# Patient Record
Sex: Female | Born: 1970 | Hispanic: No | Marital: Married | Smoking: Never smoker
Health system: Southern US, Community
[De-identification: ages and names within clinical notes are randomized; demographics above are authoritative.]

## PROBLEM LIST (undated history)

## (undated) DIAGNOSIS — E785 Hyperlipidemia, unspecified: Secondary | ICD-10-CM

## (undated) DIAGNOSIS — E119 Type 2 diabetes mellitus without complications: Secondary | ICD-10-CM

## (undated) HISTORY — DX: Type 2 diabetes mellitus without complications: E11.9

## (undated) HISTORY — DX: Hyperlipidemia, unspecified: E78.5

---

## 2001-03-29 ENCOUNTER — Emergency Department (HOSPITAL_COMMUNITY): Admission: EM | Admit: 2001-03-29 | Discharge: 2001-03-29 | Payer: Self-pay | Admitting: Emergency Medicine

## 2004-03-28 ENCOUNTER — Emergency Department (HOSPITAL_COMMUNITY): Admission: EM | Admit: 2004-03-28 | Discharge: 2004-03-29 | Payer: Self-pay | Admitting: Emergency Medicine

## 2004-04-12 ENCOUNTER — Emergency Department (HOSPITAL_COMMUNITY): Admission: EM | Admit: 2004-04-12 | Discharge: 2004-04-12 | Payer: Self-pay | Admitting: Emergency Medicine

## 2004-04-16 ENCOUNTER — Emergency Department (HOSPITAL_COMMUNITY): Admission: EM | Admit: 2004-04-16 | Discharge: 2004-04-16 | Payer: Self-pay | Admitting: *Deleted

## 2005-04-12 ENCOUNTER — Ambulatory Visit (HOSPITAL_COMMUNITY): Admission: RE | Admit: 2005-04-12 | Discharge: 2005-04-12 | Payer: Self-pay | Admitting: Obstetrics & Gynecology

## 2005-07-21 ENCOUNTER — Ambulatory Visit: Payer: Self-pay | Admitting: Family Medicine

## 2005-07-21 ENCOUNTER — Inpatient Hospital Stay (HOSPITAL_COMMUNITY): Admission: AD | Admit: 2005-07-21 | Discharge: 2005-07-23 | Payer: Self-pay | Admitting: *Deleted

## 2005-07-21 ENCOUNTER — Encounter (INDEPENDENT_AMBULATORY_CARE_PROVIDER_SITE_OTHER): Payer: Self-pay | Admitting: *Deleted

## 2007-09-01 ENCOUNTER — Emergency Department (HOSPITAL_COMMUNITY): Admission: EM | Admit: 2007-09-01 | Discharge: 2007-09-01 | Payer: Self-pay | Admitting: Emergency Medicine

## 2007-09-08 ENCOUNTER — Emergency Department (HOSPITAL_COMMUNITY): Admission: EM | Admit: 2007-09-08 | Discharge: 2007-09-09 | Payer: Self-pay | Admitting: Emergency Medicine

## 2010-03-07 ENCOUNTER — Emergency Department (HOSPITAL_COMMUNITY): Admission: EM | Admit: 2010-03-07 | Discharge: 2010-03-07 | Payer: Self-pay | Admitting: Emergency Medicine

## 2010-10-08 ENCOUNTER — Emergency Department (HOSPITAL_COMMUNITY)
Admission: EM | Admit: 2010-10-08 | Discharge: 2010-10-09 | Disposition: A | Discharge status: Home / Self Care | Payer: Self-pay | Attending: Emergency Medicine | Admitting: Emergency Medicine

## 2010-10-08 ENCOUNTER — Emergency Department (HOSPITAL_COMMUNITY): Payer: Self-pay

## 2010-10-08 DIAGNOSIS — R112 Nausea with vomiting, unspecified: Secondary | ICD-10-CM | POA: Insufficient documentation

## 2010-10-08 DIAGNOSIS — R51 Headache: Secondary | ICD-10-CM | POA: Insufficient documentation

## 2010-10-08 DIAGNOSIS — H53149 Visual discomfort, unspecified: Secondary | ICD-10-CM | POA: Insufficient documentation

## 2010-10-08 LAB — URINALYSIS, ROUTINE W REFLEX MICROSCOPIC
Bilirubin Urine: NEGATIVE
Glucose, UA: NEGATIVE mg/dL
Ketones, ur: NEGATIVE mg/dL
Urobilinogen, UA: 1 mg/dL (ref 0.0–1.0)
pH: 7 (ref 5.0–8.0)

## 2010-10-08 LAB — POCT I-STAT, CHEM 8
BUN: 11 mg/dL (ref 6–23)
Calcium, Ion: 1.12 mmol/L (ref 1.12–1.32)
Chloride: 105 mEq/L (ref 96–112)
Glucose, Bld: 95 mg/dL (ref 70–99)
Hemoglobin: 12.2 g/dL (ref 12.0–15.0)
Sodium: 141 mEq/L (ref 135–145)

## 2010-10-08 LAB — POCT PREGNANCY, URINE: Preg Test, Ur: NEGATIVE

## 2010-10-12 LAB — URINE MICROSCOPIC-ADD ON

## 2010-10-12 LAB — URINALYSIS, ROUTINE W REFLEX MICROSCOPIC
Bilirubin Urine: NEGATIVE
Protein, ur: NEGATIVE mg/dL
Urobilinogen, UA: 0.2 mg/dL (ref 0.0–1.0)
pH: 5.5 (ref 5.0–8.0)

## 2010-10-12 LAB — COMPREHENSIVE METABOLIC PANEL
ALT: 44 U/L — ABNORMAL HIGH (ref 0–35)
Alkaline Phosphatase: 65 U/L (ref 39–117)
CO2: 22 mEq/L (ref 19–32)
Calcium: 8.8 mg/dL (ref 8.4–10.5)
Creatinine, Ser: 0.68 mg/dL (ref 0.4–1.2)
Sodium: 139 mEq/L (ref 135–145)
Total Bilirubin: 0.7 mg/dL (ref 0.3–1.2)

## 2010-10-12 LAB — WET PREP, GENITAL: Trich, Wet Prep: NONE SEEN

## 2010-10-12 LAB — DIFFERENTIAL
Basophils Relative: 0 % (ref 0–1)
Eosinophils Absolute: 0.1 10*3/uL (ref 0.0–0.7)
Eosinophils Relative: 2 % (ref 0–5)
Lymphocytes Relative: 39 % (ref 12–46)
Lymphs Abs: 1.7 10*3/uL (ref 0.7–4.0)
Monocytes Absolute: 0.3 10*3/uL (ref 0.1–1.0)
Monocytes Relative: 7 % (ref 3–12)
Neutro Abs: 2.3 10*3/uL (ref 1.7–7.7)

## 2010-10-12 LAB — CBC
MCH: 31 pg (ref 26.0–34.0)
MCV: 88.7 fL (ref 78.0–100.0)
Platelets: 208 10*3/uL (ref 150–400)
RBC: 4.26 MIL/uL (ref 3.87–5.11)
WBC: 4.3 10*3/uL (ref 4.0–10.5)

## 2010-10-12 LAB — LIPASE, BLOOD: Lipase: 24 U/L (ref 11–59)

## 2010-10-12 LAB — HEMOCCULT GUIAC POC 1CARD (OFFICE): Fecal Occult Bld: NEGATIVE

## 2011-04-19 LAB — DIFFERENTIAL
Eosinophils Relative: 1
Lymphocytes Relative: 8 — ABNORMAL LOW
Lymphs Abs: 0.9
Neutrophils Relative %: 87 — ABNORMAL HIGH

## 2011-04-19 LAB — POCT I-STAT CREATININE
Creatinine, Ser: 0.5
Operator id: 277751

## 2011-04-19 LAB — I-STAT 8, (EC8 V) (CONVERTED LAB)
Acid-Base Excess: 1
Hemoglobin: 12.2
Potassium: 3.4 — ABNORMAL LOW
Sodium: 137
TCO2: 24

## 2011-04-19 LAB — CBC
HCT: 34.7 — ABNORMAL LOW
Platelets: 374
RBC: 3.87
WBC: 10.6 — ABNORMAL HIGH

## 2015-08-04 ENCOUNTER — Telehealth: Payer: Self-pay | Admitting: *Deleted

## 2015-08-04 DIAGNOSIS — Z20818 Contact with and (suspected) exposure to other bacterial communicable diseases: Secondary | ICD-10-CM

## 2015-08-04 MED ORDER — AZITHROMYCIN 250 MG PO TABS: ORAL_TABLET | ORAL | Status: AC

## 2015-08-04 NOTE — Telephone Encounter (Signed)
Patient's son has pertussis. I am prescribing a course of prophylactic azithromycin to patient.  

## 2016-01-31 ENCOUNTER — Encounter (HOSPITAL_COMMUNITY): Payer: Self-pay | Admitting: Emergency Medicine

## 2016-01-31 ENCOUNTER — Ambulatory Visit (INDEPENDENT_AMBULATORY_CARE_PROVIDER_SITE_OTHER): Payer: Self-pay

## 2016-01-31 ENCOUNTER — Ambulatory Visit (HOSPITAL_COMMUNITY)
Admission: EM | Admit: 2016-01-31 | Discharge: 2016-01-31 | Disposition: A | Discharge status: Home / Self Care | Payer: Self-pay | Attending: Emergency Medicine | Admitting: Emergency Medicine

## 2016-01-31 DIAGNOSIS — S46911A Strain of unspecified muscle, fascia and tendon at shoulder and upper arm level, right arm, initial encounter: Secondary | ICD-10-CM

## 2016-01-31 DIAGNOSIS — G44209 Tension-type headache, unspecified, not intractable: Secondary | ICD-10-CM

## 2016-01-31 MED ORDER — DICLOFENAC SODIUM 75 MG PO TBEC: 75.0000 mg | DELAYED_RELEASE_TABLET | Freq: Two times a day (BID) | ORAL | Status: DC

## 2016-01-31 NOTE — ED Notes (Signed)
[facility] a [facility] of right arm numbness and tingling that occurred yesterday and a headache with blurred vision that occurred today.

## 2016-01-31 NOTE — Discharge Instructions (Signed)
Dolor en el hombro (Shoulder Pain)  El hombro es la articulacin que une los brazos al cuerpo. Los msculos y tejidos similares a cuerdas [employer] Timken Companyunen los huesos a los msculos (tendones). El dolor en el hombro aparece como consecuencia de una lesin o una enfermedad que afecta una o ms partes del hombro. CUIDADOS EN EL HOGAR   Aplique hielo sobre la zona dolorida.  Ponga el hielo en una bolsa plstica.  Colquese una toalla entre la piel y la bolsa de hielo.  Deje el hielo en el lugar durante 15 a 20 minutos, 3 a 4 veces por Allstateda durante los [address] Nwprimeros das.  Deje de usar las compresas fras si no Editor, commissioningle calman el dolor.  Si le indican el uso de algn dispositivo para impedir que el hombro se mueva (cabestillo, inmovilizador del hombro), selo segn las indicaciones. Slo debe quitarlo para ducharse o baarse.  Mueva el brazo lo menos posible, pero mantenga la mano en movimiento para evitar la inflamacin (hinchazn).  Apriete una pelota blanda o una almohadilla de espuma de goma siempre que pueda, para evitar la hinchazn.  Tome los medicamentos como le indic el mdico. SOLICITE AYUDA SI:  Siente un dolor nuevo que se hace cada vez ms intenso en el brazo, la mano o los dedos.  La mano o los dedos estn fros.  Los medicamentos no Editor, commissioningle calman el dolor. SOLICITE AYUDA DE INMEDIATO SI:   El brazo, la mano o los dedos estn adormecidos o siente hormigueos.  El brazo, la mano o los dedos estn hinchados (inflamados), le duelen o se ven blancos o azules. ASEGRESE DE QUE:   Comprende estas instrucciones.  Controlar su enfermedad.  Solicitar ayuda de inmediato si no mejora o si empeora.   Esta informacin no tiene Theme park managercomo fin reemplazar el consejo del mdico. Asegrese de hacerle al mdico cualquier pregunta que tenga.   Document Released: 10/11/2008 Document Revised: 08/05/2014 Elsevier Interactive Patient Education [employer]2016 Elsevier Inc.

## 2016-01-31 NOTE — ED Provider Notes (Signed)
CSN: [id number]     Arrival date & time 01/31/16  1700 History   First MD Initiated Contact with Patient 01/31/16 1750     Chief Complaint  Patient presents with  . Headache  . Numbness   (Consider location/radiation/quality/duration/timing/severity/associated sxs/prior Treatment) HPI History obtained from patient: Location:  Right shoulder, head Context/Duration: Symptoms started yesterday last night. Patient states that her arm to felt funny and she developed a headache arm did feel better after a while. Now has shoulder pain  Severity: 3   Quality: Soreness Timing:         Constant   Home Treatment: Tylenol Associated symptoms:  Headache no treatment at home. No vision changes. No thunderclap sudden onset headache. No nausea vomiting. Family History: Hypertension   History reviewed. No pertinent past medical history. History reviewed. No pertinent past surgical history. History reviewed. No pertinent family history. Social History  Substance Use Topics  . Smoking status: Never Smoker   . Smokeless tobacco: None  . Alcohol Use: No   OB History    No data available     Review of Systems  Denies:  , NAUSEA, ABDOMINAL PAIN, CHEST PAIN, CONGESTION, DYSURIA, SHORTNESS OF BREATH  Allergies  Review of patient's allergies indicates no known allergies.  Home Medications   Prior to Admission medications   Not on File   Meds Ordered and Administered this Visit  Medications - No data to display  BP 130/84 mmHg  Pulse 65  Temp(Src) 98.6 F (37 C) (Oral)  Resp 18  SpO2 100%  LMP 01/01/2016 (Approximate) No data found.   Physical Exam NURSES NOTES AND VITAL SIGNS REVIEWED. CONSTITUTIONAL: Well developed, well nourished, no acute distress HEENT: normocephalic, atraumatic EYES: Conjunctiva normal NECK:normal ROM, supple, no adenopathy PULMONARY:No respiratory distress, normal effort ABDOMINAL: Soft, ND, NT BS+, No CVAT MUSCULOSKELETAL: Normal ROM of all extremities,  right shoulder tender anteriorly with good range of motion neurovascular integrity is intact grip strength is a little decreased on the right as compared to the left. SKIN: warm and dry without rash PSYCHIATRIC: Mood and affect, behavior are normal  ED Course  Procedures (including critical care time)  Labs Review Labs Reviewed - No data to display  Imaging Review Dg Shoulder Right  01/31/2016  CLINICAL DATA:  Per pt AND pt's son: pain, numbness in the right arm, tingling in the right hand and fingers, severe headache, right eye was swollen this morning. No injury. Non-smoker. Patient is not a diabetic EXAM: RIGHT SHOULDER - 2+ VIEW COMPARISON:  None. FINDINGS: Glenohumeral joint is intact. No evidence of scapular fracture or humeral fracture. The acromioclavicular joint is intact. IMPRESSION: No fracture or dislocation. Electronically Signed   By: Genevive BiStewart  Edmunds M.D.   On: 01/31/2016 18:51    Reviewed both xray Independently and radiologist report as part of medical decision making Visual Acuity Review  Right Eye Distance:   Left Eye Distance:   Bilateral Distance:    Right Eye Near:   Left Eye Near:    Bilateral Near:      RX diclofenac Return to work note   MDM   1. Tension-type headache, not intractable, unspecified chronicity pattern   2. Repetitive strain injury of right shoulder, initial encounter     Patient is reassured that there are no issues that require transfer to higher level of care at this time or additional tests. Patient is advised to continue home symptomatic treatment. Patient is advised that if there are new or worsening symptoms  to attend the emergency department, contact primary care provider, or return to UC. Instructions of care provided discharged home in stable condition.    THIS NOTE WAS GENERATED USING A VOICE RECOGNITION SOFTWARE PROGRAM. ALL REASONABLE EFFORTS  WERE MADE TO PROOFREAD THIS DOCUMENT FOR ACCURACY.  I have verbally reviewed  the discharge instructions with the patient. A printed AVS was given to the patient.  All questions were answered prior to discharge.      Tharon AquasFrank C Lajuana Patchell, PA 01/31/16 [phone]

## 2016-11-26 ENCOUNTER — Emergency Department (HOSPITAL_COMMUNITY)
Admission: EM | Admit: 2016-11-26 | Discharge: 2016-11-27 | Disposition: A | Discharge status: Home / Self Care | Payer: Self-pay | Attending: Emergency Medicine | Admitting: Emergency Medicine

## 2016-11-26 ENCOUNTER — Emergency Department (HOSPITAL_COMMUNITY): Payer: Self-pay

## 2016-11-26 ENCOUNTER — Encounter (HOSPITAL_COMMUNITY): Payer: Self-pay | Admitting: Emergency Medicine

## 2016-11-26 DIAGNOSIS — K5790 Diverticulosis of intestine, part unspecified, without perforation or abscess without bleeding: Secondary | ICD-10-CM | POA: Insufficient documentation

## 2016-11-26 DIAGNOSIS — R1032 Left lower quadrant pain: Secondary | ICD-10-CM

## 2016-11-26 LAB — URINALYSIS, ROUTINE W REFLEX MICROSCOPIC
BILIRUBIN URINE: NEGATIVE
GLUCOSE, UA: 50 mg/dL — AB
HGB URINE DIPSTICK: NEGATIVE
Ketones, ur: NEGATIVE mg/dL
Leukocytes, UA: NEGATIVE
NITRITE: NEGATIVE
PH: 5 (ref 5.0–8.0)
Protein, ur: NEGATIVE mg/dL
SPECIFIC GRAVITY, URINE: 1.032 — AB (ref 1.005–1.030)

## 2016-11-26 LAB — CBC
HEMATOCRIT: 35.6 % — AB (ref 36.0–46.0)
HEMOGLOBIN: 12.2 g/dL (ref 12.0–15.0)
MCH: 30.7 pg (ref 26.0–34.0)
MCHC: 34.3 g/dL (ref 30.0–36.0)
MCV: 89.4 fL (ref 78.0–100.0)
Platelets: 265 10*3/uL (ref 150–400)
RBC: 3.98 MIL/uL (ref 3.87–5.11)
RDW: 12.8 % (ref 11.5–15.5)
WBC: 8.6 10*3/uL (ref 4.0–10.5)

## 2016-11-26 LAB — COMPREHENSIVE METABOLIC PANEL
ALK PHOS: 79 U/L (ref 38–126)
ALT: 73 U/L — AB (ref 14–54)
AST: 44 U/L — AB (ref 15–41)
Albumin: 3.8 g/dL (ref 3.5–5.0)
Anion gap: 5 (ref 5–15)
BILIRUBIN TOTAL: 0.6 mg/dL (ref 0.3–1.2)
BUN: 9 mg/dL (ref 6–20)
CHLORIDE: 107 mmol/L (ref 101–111)
CO2: 25 mmol/L (ref 22–32)
Calcium: 8.9 mg/dL (ref 8.9–10.3)
Creatinine, Ser: 0.62 mg/dL (ref 0.44–1.00)
GFR calc Af Amer: >60 mL/min (ref >60)
Glucose, Bld: 120 mg/dL — ABNORMAL HIGH (ref 65–99)
POTASSIUM: 3.7 mmol/L (ref 3.5–5.1)
Sodium: 137 mmol/L (ref 135–145)
Total Protein: 6.7 g/dL (ref 6.5–8.1)

## 2016-11-26 LAB — LIPASE, BLOOD: LIPASE: 21 U/L (ref 11–51)

## 2016-11-26 LAB — I-STAT BETA HCG BLOOD, ED (MC, WL, AP ONLY): I-stat hCG, quantitative: <5 m[IU]/mL (ref <5)

## 2016-11-26 MED ORDER — ONDANSETRON HCL 4 MG/2ML IJ SOLN
4.0000 mg | Freq: Once | INTRAMUSCULAR | Status: AC
  Administered 2016-11-26: 4 mg via INTRAVENOUS
  Filled 2016-11-26: qty 2

## 2016-11-26 MED ORDER — ONDANSETRON 4 MG PO TBDP: 4.0000 mg | ORAL_TABLET | Freq: Once | ORAL | Status: DC | PRN

## 2016-11-26 MED ORDER — OXYCODONE-ACETAMINOPHEN 5-325 MG PO TABS
ORAL_TABLET | ORAL | Status: AC
  Filled 2016-11-26: qty 1

## 2016-11-26 MED ORDER — IOPAMIDOL (ISOVUE-300) INJECTION 61%
INTRAVENOUS | Status: AC
  Administered 2016-11-26: 100 mL
  Filled 2016-11-26: qty 100

## 2016-11-26 MED ORDER — MORPHINE SULFATE (PF) 4 MG/ML IV SOLN
4.0000 mg | Freq: Once | INTRAVENOUS | Status: AC
  Administered 2016-11-26: 4 mg via INTRAVENOUS
  Filled 2016-11-26: qty 1

## 2016-11-26 MED ORDER — ONDANSETRON 4 MG PO TBDP
ORAL_TABLET | ORAL | Status: AC
  Administered 2016-11-26: 4 mg
  Filled 2016-11-26: qty 1

## 2016-11-26 MED ORDER — OXYCODONE-ACETAMINOPHEN 5-325 MG PO TABS
1.0000 | ORAL_TABLET | Freq: Once | ORAL | Status: AC
  Administered 2016-11-26: 1 via ORAL

## 2016-11-26 NOTE — ED Triage Notes (Signed)
Pt presents to ED for assessment of LLQ pain starting two days and worsening.  Patient c/o nausea, with added vomiting and diarrhea today.  Paient states in the past year her periods have become irregular.  Denies any changes in urination.

## 2016-11-26 NOTE — ED Provider Notes (Signed)
[facility]-EMERGENCY DEPT Provider Note   CSN: [id number] Arrival date & time: 11/26/16  1925     History   Chief Complaint Chief Complaint  Patient presents with  . Abdominal Pain    HPI Christie Martinez is a 46 y.o. female.  HPI  46 y.o. female, presents to the Emergency Department today complaining of LLQ pain that started x 2 days ago. Notes worsening symptoms today. Notes N/V. No diarrhea. States pain is intermittent and feels like a cramping/aching sensation. Notes pain is 8/10. No fevers. No hx diverticulitis. Pt states that she has been having normal BMs. No melena. No hematochezia. No dysuria. No vaginal bleeding/discharge. No meds PTA. No other symptoms noted.   History reviewed. No pertinent past medical history.  There are no active problems to display for this patient.   History reviewed. No pertinent surgical history.  OB History    No data available       Home Medications    Prior to Admission medications   Medication Sig Start Date End Date Taking? Authorizing Provider  diclofenac (VOLTAREN) 75 MG EC tablet Take 1 tablet (75 mg total) by mouth 2 (two) times daily. 01/31/16   Tharon Aquas, PA    Family History History reviewed. No pertinent family history.  Social History Social History  Substance Use Topics  . Smoking status: Never Smoker  . Smokeless tobacco: Never Used  . Alcohol use No     Allergies   Patient has no known allergies.   Review of Systems Review of Systems ROS reviewed and all are negative for acute change except as noted in the HPI.  Physical Exam Updated Vital Signs BP 127/81   Pulse 65   Temp 98.6 F (37 C) (Oral)   Resp 17   LMP 11/12/2016   SpO2 100%   Physical Exam  Constitutional: She is oriented to person, place, and time. Vital signs are normal. She appears well-developed and well-nourished.  HENT:  Head: Normocephalic and atraumatic.  Right Ear: Hearing normal.  Left Ear: Hearing normal.  Eyes:  Conjunctivae and EOM are normal. Pupils are equal, round, and reactive to light.  Neck: Normal range of motion. Neck supple.  Cardiovascular: Normal rate, regular rhythm, normal heart sounds and intact distal pulses.   Pulmonary/Chest: Effort normal and breath sounds normal.  Abdominal: Soft. There is tenderness in the left lower quadrant. There is no rigidity, no rebound, no guarding, no CVA tenderness, no tenderness at McBurney's point and negative Murphy's sign.  TTP LLQ  Musculoskeletal: Normal range of motion.  Neurological: She is alert and oriented to person, place, and time.  Skin: Skin is warm and dry.  Psychiatric: She has a normal mood and affect. Her speech is normal and behavior is normal. Thought content normal.  Nursing note and vitals reviewed.  ED Treatments / Results  Labs (all labs ordered are listed, but only abnormal results are displayed) Labs Reviewed  COMPREHENSIVE METABOLIC PANEL - Abnormal; Notable for the following:       Result Value   Glucose, Bld 120 (*)    AST 44 (*)    ALT 73 (*)    All other components within normal limits  CBC - Abnormal; Notable for the following:    HCT 35.6 (*)    All other components within normal limits  URINALYSIS, ROUTINE W REFLEX MICROSCOPIC - Abnormal; Notable for the following:    Specific Gravity, Urine 1.032 (*)    Glucose, UA 50 (*)  All other components within normal limits  LIPASE, BLOOD  I-STAT BETA HCG BLOOD, ED ([facility], [facility], [facility] ONLY)    EKG  EKG Interpretation None       Radiology Ct Abdomen Pelvis W Contrast  Result Date: 11/26/2016 CLINICAL DATA:  Left lower quadrant pain for 2 days EXAM: CT ABDOMEN AND PELVIS WITH CONTRAST TECHNIQUE: Multidetector CT imaging of the abdomen and pelvis was performed using the standard protocol following bolus administration of intravenous contrast. CONTRAST:  ISOVUE-300 IOPAMIDOL (ISOVUE-300) INJECTION 61% COMPARISON:  None. FINDINGS: Lower chest: No acute abnormality.  Hepatobiliary: No focal liver abnormality is seen. No gallstones, gallbladder wall thickening, or biliary dilatation. Pancreas: Unremarkable. No pancreatic ductal dilatation or surrounding inflammatory changes. Spleen: Normal in size without focal abnormality. Adrenals/Urinary Tract: Adrenal glands are unremarkable. Kidneys are normal, without renal calculi, focal lesion, or hydronephrosis. Bladder is unremarkable. Stomach/Bowel: Stomach is within normal limits. Appendix is normal. Very mild uncomplicated colonic diverticulosis. No evidence of bowel wall thickening, distention, or inflammatory changes. Vascular/Lymphatic: No significant vascular findings are present. No enlarged abdominal or pelvic lymph nodes. Reproductive: Uterus and bilateral adnexa are unremarkable. Other: No focal inflammation. No ascites. Small fat containing umbilical hernia. Musculoskeletal: No significant skeletal lesion. IMPRESSION: No significant abnormality. Mild uncomplicated colonic diverticulosis. Electronically Signed   By: Ellery Plunk M.D.   On: 11/26/2016 23:55    Procedures Procedures (including critical care time)  Medications Ordered in ED Medications  ondansetron (ZOFRAN-ODT) disintegrating tablet 4 mg (not administered)  ondansetron (ZOFRAN-ODT) 4 MG disintegrating tablet (4 mg  Given 11/26/16 2023)  oxyCODONE-acetaminophen (PERCOCET/ROXICET) 5-325 MG per tablet 1 tablet (1 tablet Oral Given 11/26/16 2023)     Initial Impression / Assessment and Plan / ED Course  I have reviewed the triage vital signs and the nursing notes.  Pertinent labs & imaging results that were available during my care of the patient were reviewed by me and considered in my medical decision making (see chart for details).  Final Clinical Impressions(s) / ED Diagnoses  {I have reviewed and evaluated the relevant laboratory values. {I have reviewed and evaluated the relevant imaging studies.  {I have reviewed the relevant previous  healthcare records.  {I obtained HPI from historian.   ED Course:  Assessment: Patient is a 46 y.o. female presents with abdominal pain x 2 days. Noted LLQ. NO hx same. No diarrhea. No melena/hematochezia. No fevers. On exam, nontoxic, nonseptic appearing, in no apparent distress. Patient's pain and other symptoms adequately managed in emergency department.  Fluid bolus given.  Labs, imaging and vitals reviewed. Labs unremarkable. CT Abdomen unremarkable. Did show Diverticulosis. No diverticulitis. Patient does not meet the SIRS or Sepsis criteria.  On repeat exam patient does not have a surgical abdomen and there are no peritoneal signs.  No indication of appendicitis, bowel obstruction, bowel perforation, cholecystitis, diverticulitis, PID or ectopic pregnancy. Will treat with Flagyl and have follow up to GI. Patient discharged home with symptomatic treatment and given strict instructions for follow-up with their primary care physician.  I have also discussed reasons to return immediately to the ER.  Patient expresses understanding and agrees with plan.  Disposition/Plan:  DC Home Additional Verbal discharge instructions given and discussed with patient.  Pt Instructed to f/u with PCP in the next week for evaluation and treatment of symptoms. Return precautions given Pt acknowledges and agrees with plan  Supervising Physician Melene Plan, DO  Final diagnoses:  Left lower quadrant pain  Diverticulosis of intestine without bleeding, unspecified  intestinal tract location    New Prescriptions New Prescriptions   No medications on file     Audry Pili, PA-C 11/27/16 0029    Melene Plan, DO 11/27/16 1539

## 2016-11-26 NOTE — ED Notes (Signed)
Taken to ct at this time. 

## 2016-11-27 MED ORDER — ONDANSETRON 4 MG PO TBDP: 4.0000 mg | ORAL_TABLET | Freq: Three times a day (TID) | ORAL | 0 refills | Status: DC | PRN

## 2016-11-27 MED ORDER — PROMETHAZINE HCL 25 MG/ML IJ SOLN
25.0000 mg | Freq: Once | INTRAMUSCULAR | Status: AC
  Administered 2016-11-27: 25 mg via INTRAVENOUS
  Filled 2016-11-27: qty 1

## 2016-11-27 MED ORDER — IBUPROFEN 600 MG PO TABS: 600.0000 mg | ORAL_TABLET | Freq: Four times a day (QID) | ORAL | 0 refills | Status: DC | PRN

## 2016-11-27 MED ORDER — METRONIDAZOLE 500 MG PO TABS: 500.0000 mg | ORAL_TABLET | Freq: Two times a day (BID) | ORAL | 0 refills | Status: DC

## 2016-11-27 NOTE — ED Notes (Signed)
Pt stable, understands discharge instructions, and reasons for return.   

## 2016-11-27 NOTE — Discharge Instructions (Signed)
Please read and follow all provided instructions.  Your diagnoses today include:  1. Left lower quadrant pain   2. Diverticulosis of intestine without bleeding, unspecified intestinal tract location     Tests performed today include: Vital signs. See below for your results today.   Medications prescribed:  Take as prescribed   Home care instructions:  Follow any educational materials contained in this packet.  Follow-up instructions: Please follow-up with Gastroenterology for further evaluation of symptoms and treatment   Return instructions:  Please return to the Emergency Department if you do not get better, if you get worse, or new symptoms OR  - Fever (temperature greater than 101.17F)  - Bleeding that does not stop with holding pressure to the area    -Severe pain (please note that you may be more sore the day after your accident)  - Chest Pain  - Difficulty breathing  - Severe nausea or vomiting  - Inability to tolerate food and liquids  - Passing out  - Skin becoming red around your wounds  - Change in mental status (confusion or lethargy)  - New numbness or weakness    Please return if you have any other emergent concerns.  Additional Information:  Your vital signs today were: BP 118/70    Pulse 67    Temp 98.6 F (37 C) (Oral)    Resp 17    LMP 11/12/2016    SpO2 99%  If your blood pressure (BP) was elevated above 135/85 this visit, please have this repeated by your doctor within one month. ---------------

## 2019-05-06 ENCOUNTER — Inpatient Hospital Stay (HOSPITAL_COMMUNITY)
Admission: EM | Admit: 2019-05-06 | Discharge: 2019-05-10 | DRG: 391 | Disposition: A | Discharge status: Home / Self Care | Payer: Self-pay | Attending: Internal Medicine | Admitting: Internal Medicine

## 2019-05-06 ENCOUNTER — Encounter (HOSPITAL_COMMUNITY): Payer: Self-pay | Admitting: Emergency Medicine

## 2019-05-06 ENCOUNTER — Other Ambulatory Visit: Payer: Self-pay

## 2019-05-06 DIAGNOSIS — K5720 Diverticulitis of large intestine with perforation and abscess without bleeding: Principal | ICD-10-CM | POA: Diagnosis present

## 2019-05-06 DIAGNOSIS — E1110 Type 2 diabetes mellitus with ketoacidosis without coma: Secondary | ICD-10-CM | POA: Diagnosis present

## 2019-05-06 DIAGNOSIS — N179 Acute kidney failure, unspecified: Secondary | ICD-10-CM | POA: Diagnosis present

## 2019-05-06 DIAGNOSIS — A419 Sepsis, unspecified organism: Secondary | ICD-10-CM | POA: Diagnosis present

## 2019-05-06 DIAGNOSIS — Z23 Encounter for immunization: Secondary | ICD-10-CM

## 2019-05-06 DIAGNOSIS — E876 Hypokalemia: Secondary | ICD-10-CM | POA: Diagnosis present

## 2019-05-06 DIAGNOSIS — N858 Other specified noninflammatory disorders of uterus: Secondary | ICD-10-CM | POA: Diagnosis present

## 2019-05-06 DIAGNOSIS — Z833 Family history of diabetes mellitus: Secondary | ICD-10-CM

## 2019-05-06 DIAGNOSIS — K753 Granulomatous hepatitis, not elsewhere classified: Secondary | ICD-10-CM | POA: Diagnosis present

## 2019-05-06 DIAGNOSIS — Z20828 Contact with and (suspected) exposure to other viral communicable diseases: Secondary | ICD-10-CM | POA: Diagnosis present

## 2019-05-06 DIAGNOSIS — K449 Diaphragmatic hernia without obstruction or gangrene: Secondary | ICD-10-CM | POA: Diagnosis present

## 2019-05-06 LAB — POCT I-STAT EG7
Acid-base deficit: 10 mmol/L — ABNORMAL HIGH (ref 0.0–2.0)
Bicarbonate: 13.8 mmol/L — ABNORMAL LOW (ref 20.0–28.0)
Calcium, Ion: 1.1 mmol/L — ABNORMAL LOW (ref 1.15–1.40)
HCT: 49 % — ABNORMAL HIGH (ref 36.0–46.0)
Hemoglobin: 16.7 g/dL — ABNORMAL HIGH (ref 12.0–15.0)
O2 Saturation: 100 %
Potassium: 5 mmol/L (ref 3.5–5.1)
Sodium: 136 mmol/L (ref 135–145)
TCO2: 15 mmol/L — ABNORMAL LOW (ref 22–32)
pCO2, Ven: 27 mmHg — ABNORMAL LOW (ref 44.0–60.0)
pH, Ven: 7.316 (ref 7.250–7.430)
pO2, Ven: 190 mmHg — ABNORMAL HIGH (ref 32.0–45.0)

## 2019-05-06 LAB — CBC
HCT: 49.1 % — ABNORMAL HIGH (ref 36.0–46.0)
Hemoglobin: 16.5 g/dL — ABNORMAL HIGH (ref 12.0–15.0)
MCH: 30.9 pg (ref 26.0–34.0)
MCHC: 33.6 g/dL (ref 30.0–36.0)
MCV: 91.9 fL (ref 80.0–100.0)
Platelets: 373 10*3/uL (ref 150–400)
RBC: 5.34 MIL/uL — ABNORMAL HIGH (ref 3.87–5.11)
RDW: 12.7 % (ref 11.5–15.5)
WBC: 10.8 10*3/uL — ABNORMAL HIGH (ref 4.0–10.5)
nRBC: 0 % (ref 0.0–0.2)

## 2019-05-06 LAB — URINALYSIS, ROUTINE W REFLEX MICROSCOPIC
Bilirubin Urine: NEGATIVE
Glucose, UA: >=500 mg/dL — AB
Hgb urine dipstick: NEGATIVE
Ketones, ur: 80 mg/dL — AB
Leukocytes,Ua: NEGATIVE
Nitrite: NEGATIVE
Protein, ur: NEGATIVE mg/dL
Specific Gravity, Urine: 1.03 (ref 1.005–1.030)
pH: 5 (ref 5.0–8.0)

## 2019-05-06 LAB — BASIC METABOLIC PANEL
Anion gap: 12 (ref 5–15)
BUN: 14 mg/dL (ref 6–20)
CO2: 18 mmol/L — ABNORMAL LOW (ref 22–32)
Calcium: 7.7 mg/dL — ABNORMAL LOW (ref 8.9–10.3)
Chloride: 115 mmol/L — ABNORMAL HIGH (ref 98–111)
Creatinine, Ser: 0.69 mg/dL (ref 0.44–1.00)
GFR calc Af Amer: >60 mL/min (ref >60)
GFR calc non Af Amer: >60 mL/min (ref >60)
Glucose, Bld: 254 mg/dL — ABNORMAL HIGH (ref 70–99)
Potassium: 3.3 mmol/L — ABNORMAL LOW (ref 3.5–5.1)
Sodium: 145 mmol/L (ref 135–145)

## 2019-05-06 LAB — HIV ANTIBODY (ROUTINE TESTING W REFLEX): HIV Screen 4th Generation wRfx: NONREACTIVE

## 2019-05-06 LAB — APTT: aPTT: 23 seconds — ABNORMAL LOW (ref 24–36)

## 2019-05-06 LAB — LIPASE, BLOOD: Lipase: 33 U/L (ref 11–51)

## 2019-05-06 LAB — CBG MONITORING, ED
Glucose-Capillary: >600 mg/dL (ref 70–99)
Glucose-Capillary: 448 mg/dL — ABNORMAL HIGH (ref 70–99)
Glucose-Capillary: 411 mg/dL — ABNORMAL HIGH (ref 70–99)
Glucose-Capillary: 330 mg/dL — ABNORMAL HIGH (ref 70–99)
Glucose-Capillary: 371 mg/dL — ABNORMAL HIGH (ref 70–99)
Glucose-Capillary: 281 mg/dL — ABNORMAL HIGH (ref 70–99)
Glucose-Capillary: 231 mg/dL — ABNORMAL HIGH (ref 70–99)
Glucose-Capillary: 220 mg/dL — ABNORMAL HIGH (ref 70–99)
Glucose-Capillary: 180 mg/dL — ABNORMAL HIGH (ref 70–99)

## 2019-05-06 LAB — PROTIME-INR
INR: 1.1 (ref 0.8–1.2)
Prothrombin Time: 14.4 seconds (ref 11.4–15.2)

## 2019-05-06 LAB — COMPREHENSIVE METABOLIC PANEL
ALT: 44 U/L (ref 0–44)
AST: 22 U/L (ref 15–41)
Albumin: 4.4 g/dL (ref 3.5–5.0)
Alkaline Phosphatase: 141 U/L — ABNORMAL HIGH (ref 38–126)
Anion gap: 25 — ABNORMAL HIGH (ref 5–15)
BUN: 24 mg/dL — ABNORMAL HIGH (ref 6–20)
CO2: 11 mmol/L — ABNORMAL LOW (ref 22–32)
Calcium: 9 mg/dL (ref 8.9–10.3)
Chloride: 98 mmol/L (ref 98–111)
Creatinine, Ser: 1.5 mg/dL — ABNORMAL HIGH (ref 0.44–1.00)
GFR calc Af Amer: 48 mL/min — ABNORMAL LOW (ref >60)
GFR calc non Af Amer: 41 mL/min — ABNORMAL LOW (ref >60)
Glucose, Bld: 941 mg/dL (ref 70–99)
Potassium: 5.1 mmol/L (ref 3.5–5.1)
Sodium: 134 mmol/L — ABNORMAL LOW (ref 135–145)
Total Bilirubin: 1.8 mg/dL — ABNORMAL HIGH (ref 0.3–1.2)
Total Protein: 8 g/dL (ref 6.5–8.1)

## 2019-05-06 LAB — I-STAT BETA HCG BLOOD, ED (MC, WL, AP ONLY): I-stat hCG, quantitative: <5 m[IU]/mL (ref <5)

## 2019-05-06 LAB — LACTIC ACID, PLASMA
Lactic Acid, Venous: 4.2 mmol/L (ref 0.5–1.9)
Lactic Acid, Venous: 2.8 mmol/L (ref 0.5–1.9)
Lactic Acid, Venous: 1.4 mmol/L (ref 0.5–1.9)

## 2019-05-06 LAB — SARS CORONAVIRUS 2 (TAT 6-24 HRS): SARS Coronavirus 2: NEGATIVE

## 2019-05-06 LAB — TSH: TSH: 1.408 u[IU]/mL (ref 0.350–4.500)

## 2019-05-06 MED ORDER — LACTATED RINGERS IV BOLUS
2000.0000 mL | Freq: Once | INTRAVENOUS | Status: AC
  Administered 2019-05-06: 13:00:00 2000 mL via INTRAVENOUS

## 2019-05-06 MED ORDER — ENOXAPARIN SODIUM 40 MG/0.4ML ~~LOC~~ SOLN
40.0000 mg | Freq: Every day | SUBCUTANEOUS | Status: DC
  Administered 2019-05-07 – 2019-05-10 (×4): 40 mg via SUBCUTANEOUS
  Filled 2019-05-06 (×5): qty 0.4

## 2019-05-06 MED ORDER — DEXTROSE-NACL 5-0.45 % IV SOLN: INTRAVENOUS | Status: DC

## 2019-05-06 MED ORDER — INSULIN REGULAR BOLUS VIA INFUSION
0.0000 [IU] | Freq: Three times a day (TID) | INTRAVENOUS | Status: DC
  Filled 2019-05-06: qty 10

## 2019-05-06 MED ORDER — LACTATED RINGERS IV SOLN: INTRAVENOUS | Status: DC

## 2019-05-06 MED ORDER — ACETAMINOPHEN 325 MG PO TABS: 650.0000 mg | ORAL_TABLET | Freq: Four times a day (QID) | ORAL | Status: DC | PRN

## 2019-05-06 MED ORDER — SODIUM CHLORIDE 0.9 % IV SOLN
2.0000 g | INTRAVENOUS | Status: DC
  Administered 2019-05-07 – 2019-05-09 (×3): 2 g via INTRAVENOUS
  Filled 2019-05-06 (×2): qty 2
  Filled 2019-05-06 (×2): qty 20

## 2019-05-06 MED ORDER — ONDANSETRON HCL 4 MG PO TABS: 4.0000 mg | ORAL_TABLET | Freq: Four times a day (QID) | ORAL | Status: DC | PRN

## 2019-05-06 MED ORDER — INSULIN REGULAR(HUMAN) IN NACL 100-0.9 UT/100ML-% IV SOLN
INTRAVENOUS | Status: DC
  Administered 2019-05-06: 14:00:00 5.4 [IU]/h via INTRAVENOUS
  Filled 2019-05-06: qty 100

## 2019-05-06 MED ORDER — IPRATROPIUM-ALBUTEROL 0.5-2.5 (3) MG/3ML IN SOLN: 3.0000 mL | Freq: Four times a day (QID) | RESPIRATORY_TRACT | Status: DC | PRN

## 2019-05-06 MED ORDER — SODIUM CHLORIDE 0.9 % IV BOLUS
1000.0000 mL | Freq: Once | INTRAVENOUS | Status: AC
  Administered 2019-05-06: 19:00:00 1000 mL via INTRAVENOUS

## 2019-05-06 MED ORDER — DEXTROSE-NACL 5-0.45 % IV SOLN
INTRAVENOUS | Status: DC
  Administered 2019-05-06 – 2019-05-07 (×2): via INTRAVENOUS

## 2019-05-06 MED ORDER — MORPHINE SULFATE (PF) 2 MG/ML IV SOLN: 2.0000 mg | INTRAVENOUS | Status: DC | PRN

## 2019-05-06 MED ORDER — SODIUM CHLORIDE 0.9 % IV SOLN
INTRAVENOUS | Status: DC
  Administered 2019-05-06: 17:00:00 via INTRAVENOUS

## 2019-05-06 MED ORDER — METRONIDAZOLE IN NACL 5-0.79 MG/ML-% IV SOLN
500.0000 mg | Freq: Three times a day (TID) | INTRAVENOUS | Status: DC
  Administered 2019-05-06 – 2019-05-10 (×11): 500 mg via INTRAVENOUS
  Filled 2019-05-06 (×13): qty 100

## 2019-05-06 MED ORDER — METRONIDAZOLE IN NACL 5-0.79 MG/ML-% IV SOLN
500.0000 mg | Freq: Once | INTRAVENOUS | Status: AC
  Administered 2019-05-06: 14:00:00 500 mg via INTRAVENOUS
  Filled 2019-05-06: qty 100

## 2019-05-06 MED ORDER — SODIUM CHLORIDE 0.9 % IV SOLN
2.0000 g | Freq: Once | INTRAVENOUS | Status: AC
  Administered 2019-05-06: 15:00:00 2 g via INTRAVENOUS
  Filled 2019-05-06: qty 20

## 2019-05-06 MED ORDER — SODIUM CHLORIDE 0.9 % IV BOLUS
500.0000 mL | Freq: Once | INTRAVENOUS | Status: AC
  Administered 2019-05-06: 21:00:00 500 mL via INTRAVENOUS

## 2019-05-06 MED ORDER — SODIUM CHLORIDE 0.9% FLUSH
3.0000 mL | Freq: Two times a day (BID) | INTRAVENOUS | Status: DC
  Administered 2019-05-08 – 2019-05-09 (×4): 3 mL via INTRAVENOUS

## 2019-05-06 MED ORDER — INSULIN REGULAR(HUMAN) IN NACL 100-0.9 UT/100ML-% IV SOLN
INTRAVENOUS | Status: DC
  Administered 2019-05-06: 16:00:00 3.5 [IU]/h via INTRAVENOUS
  Filled 2019-05-06: qty 100

## 2019-05-06 MED ORDER — ONDANSETRON HCL 4 MG/2ML IJ SOLN
4.0000 mg | Freq: Four times a day (QID) | INTRAMUSCULAR | Status: DC | PRN
  Administered 2019-05-06 – 2019-05-08 (×4): 4 mg via INTRAVENOUS
  Filled 2019-05-06 (×4): qty 2

## 2019-05-06 MED ORDER — SODIUM CHLORIDE 0.9 % IV BOLUS
1000.0000 mL | Freq: Once | INTRAVENOUS | Status: AC
  Administered 2019-05-06: 17:00:00 1000 mL via INTRAVENOUS

## 2019-05-06 MED ORDER — DEXTROSE 50 % IV SOLN: 25.0000 mL | INTRAVENOUS | Status: DC | PRN

## 2019-05-06 MED ORDER — ACETAMINOPHEN 650 MG RE SUPP: 650.0000 mg | Freq: Four times a day (QID) | RECTAL | Status: DC | PRN

## 2019-05-06 NOTE — ED Notes (Signed)
Pt ambulated to restroom with one person assist for safety. Pt ambulated with steady gait.

## 2019-05-06 NOTE — ED Notes (Signed)
[facility] [id number] 

## 2019-05-06 NOTE — ED Triage Notes (Signed)
Pt was sent here by her PCP for a CT of her abd. Pt presents with left lower abd pain for 3 weeks.

## 2019-05-06 NOTE — H&P (Addendum)
History and Physical    Christie Martinez IWP:[id number] DOB: 04/01/1971 DOA: 05/06/2019  Referring MD/NP/PA: Varney Biles, MD PCP: [facility] Patient coming from: Home  Chief Complaint: abnormal blood sugar  I have personally briefly reviewed patient's old medical records in [place]   HPI: Christie Martinez is a 48 y.o. female with medical history significant of family history of diabetes; who presents with complaints of abnormal blood sugar.  History is obtained with the use of translator services.  Patient notes that for the last 3 weeks she has had left lower quadrant abdominal pain.  He was seen by her PCP at [facility] yesterday where blood work was obtained and CT scan which revealed acute diverticulitis with localized perforation at the level of the distal descending colon, small hiatal hernia, calcified right hepatic lobe granuloma 4.5 left fundal uterine calcification, and 2.5 right ovarian cyst.  She was called this morning as blood work revealed that her blood sugar was elevated up to 549.  Patient notes that her father had diabetes, but she had not been diagnosed with this before.  Associated symptoms include nausea, vomiting, urinary frequency, increased thirst, malaise, weight loss approximately 20 pounds in 3 weeks, lower extremity cramping, numbness in feet, and lightheadedness.  ED Course: Upon admission into the emergency department patient was found to be afebrile, pulse 108-147, respirations 17-30, blood pressures 126/86-149/107, and O2 saturations maintained.  Labs significant for WBC 10.8, hemoglobin 16.5, sodium 134, BUN 24 0.5, glucose 941, anion gap 25, and lactic acid 4.2.  Patient was given 2 L of lactated Ringer's, started on insulin drip, antibiotics of Rocephin, and metronidazole.  TRH called to admit.  Review of Systems  Constitutional: Positive for malaise/fatigue and weight loss. Negative for fever.  HENT: Negative for nosebleeds.    Eyes: Negative for photophobia and pain.  Respiratory: Positive for shortness of breath. Negative for cough and stridor.   Cardiovascular: Negative for chest pain and leg swelling.  Gastrointestinal: Positive for abdominal pain, nausea and vomiting.  Genitourinary: Positive for frequency. Negative for dysuria and hematuria.  Musculoskeletal: Positive for myalgias. Negative for falls.  Skin: Negative for rash.  Neurological: Positive for dizziness and sensory change. Negative for focal weakness and loss of consciousness.  Endo/Heme/Allergies: Positive for polydipsia.  Psychiatric/Behavioral: Negative for memory loss and substance abuse.    History reviewed. No pertinent past medical history.  History reviewed. No pertinent surgical history.   reports that she has never smoked. She has never used smokeless tobacco. She reports that she does not drink alcohol or use drugs.  No Known Allergies  Family History  Problem Relation Age of Onset  . Diabetes Father     Prior to Admission medications   Medication Sig Start Date End Date Taking? Authorizing Provider  ibuprofen (ADVIL,MOTRIN) 600 MG tablet Take 1 tablet (600 mg total) by mouth every 6 (six) hours as needed. 11/27/16   Shary Decamp, PA-C  metroNIDAZOLE (FLAGYL) 500 MG tablet Take 1 tablet (500 mg total) by mouth 2 (two) times daily. 11/27/16   Shary Decamp, PA-C  ondansetron (ZOFRAN ODT) 4 MG disintegrating tablet Take 1 tablet (4 mg total) by mouth every 8 (eight) hours as needed for nausea or vomiting. 11/27/16   Shary Decamp, PA-C    Physical Exam:  Constitutional: Female who appears to be in in no acute discomfort at this time. Vitals:   05/06/19 1330 05/06/19 1400 05/06/19 1415 05/06/19 1529  BP: 126/86 (!) 129/93 (!) 136/92 (!) 130/92  Pulse: (!) 108 (!) 114 (!) 109 (!) 129  Resp: (!) 23 (!) 27 (!) 27 (!) 29  Temp:      TempSrc:      SpO2: 99% 100% 99% 100%  Weight:      Height:       Eyes: PERRL, lids and conjunctivae  normal ENMT: Mucous membranes are dry. Posterior pharynx clear of any exudate or lesions. Normal dentition.  Neck: normal, supple, no masses, no thyromegaly Respiratory: clear to auscultation bilaterally, no wheezing, no crackles. Normal respiratory effort. No accessory muscle use.  Cardiovascular: Tachycardic, no murmurs / rubs / gallops. No extremity edema. 2+ pedal pulses. No carotid bruits.  Abdomen: Lower left quadrant tenderness to palpation.  Bowel sounds present.  No hepatosplenomegaly appreciated. Musculoskeletal: no clubbing / cyanosis. No joint deformity upper and lower extremities. Good ROM, no contractures. Normal muscle tone.  Skin: no rashes, lesions, ulcers. No induration Neurologic: CN 2-12 grossly intact. Sensation intact, DTR normal. Strength 5/5 in all 4.  Psychiatric: Normal judgment and insight. Alert and oriented x 3. Normal mood.     Labs on Admission: I have personally reviewed following labs and imaging studies  CBC: Recent Labs  Lab 05/06/19 1142 05/06/19 1319  WBC 10.8*  --   HGB 16.5* 16.7*  HCT 49.1* 49.0*  MCV 91.9  --   PLT 373  --    Basic Metabolic Panel: Recent Labs  Lab 05/06/19 1142 05/06/19 1319  NA 134* 136  K 5.1 5.0  CL 98  --   CO2 11*  --   GLUCOSE 941*  --   BUN 24*  --   CREATININE 1.50*  --   CALCIUM 9.0  --    GFR: Estimated Creatinine Clearance: 38.4 mL/min (A) (by C-G formula based on SCr of 1.5 mg/dL (H)). Liver Function Tests: Recent Labs  Lab 05/06/19 1142  AST 22  ALT 44  ALKPHOS 141*  BILITOT 1.8*  PROT 8.0  ALBUMIN 4.4   Recent Labs  Lab 05/06/19 1142  LIPASE 33   No results for input(s): AMMONIA in the last 168 hours. Coagulation Profile: Recent Labs  Lab 05/06/19 1441  INR 1.1   Cardiac Enzymes: No results for input(s): CKTOTAL, CKMB, CKMBINDEX, TROPONINI in the last 168 hours. BNP (last 3 results) No results for input(s): PROBNP in the last 8760 hours. HbA1C: No results for input(s): HGBA1C  in the last 72 hours. CBG: Recent Labs  Lab 05/06/19 1402 05/06/19 1519  GLUCAP >600* 448*   Lipid Profile: No results for input(s): CHOL, HDL, LDLCALC, TRIG, CHOLHDL, LDLDIRECT in the last 72 hours. Thyroid Function Tests: Recent Labs    05/06/19 1222  TSH 1.408   Anemia Panel: No results for input(s): VITAMINB12, FOLATE, FERRITIN, TIBC, IRON, RETICCTPCT in the last 72 hours. Urine analysis:    Component Value Date/Time   COLORURINE STRAW (A) 05/06/2019 1225   APPEARANCEUR CLEAR 05/06/2019 1225   LABSPEC 1.030 05/06/2019 1225   PHURINE 5.0 05/06/2019 1225   GLUCOSEU >=500 (A) 05/06/2019 1225   HGBUR NEGATIVE 05/06/2019 1225   BILIRUBINUR NEGATIVE 05/06/2019 1225   KETONESUR 80 (A) 05/06/2019 1225   PROTEINUR NEGATIVE 05/06/2019 1225   UROBILINOGEN 1.0 10/08/2010 2245   NITRITE NEGATIVE 05/06/2019 1225   LEUKOCYTESUR NEGATIVE 05/06/2019 1225   Sepsis Labs: No results found for this or any previous visit (from the past 240 hour(s)).   Radiological Exams on Admission: No results found.  EKG: Independently reviewed.  Sinus tachycardia 146 bpm.  Assessment/Plan Sepsis secondary to suspected diverticulitis: Acute.  Patient presents tachycardic and tachypneic.  [facility] noted acute diverticulitis with localized perforation at the level of the distal descending colon.  In outpatient setting patient has been on antibiotics of Bactrim and metronidazole. -Admit to a progressive bed -Clear liquid diet for and advance as tolerated -Blood cultures obtained after biotics given due to acuity of patient condition -Continue empiric antibiotics of Rocephin and metronidazole -Morphine IV as needed for pain -Trend lactic acid levels   DKA, type II: Acute.  Patient presents with glucose 941, CO2 11, anion gap 25.  Venous pH within normal limits.  Pseudohyponatremia of 134. Patient with family history of her father being diabetic.  She was given 2 L of lactated  Ringer's and started on insulin drip.   -Check hemoglobin A1c -Glucose stabilizer protocol with insulin drip -BMPs q. 3 hours x 2, then CMP in a.m. -Transition to subcu continuous insulin when medically appropriate -Diabetic education consulted  Acute kidney injury: Patient presents with creatinine elevated up to 1.5 with BUN 24.  Baseline creatinine previously noted to be around 0.6-0.8. -Continue IV fluids  -Continue to monitor kidney function    COVID-19 screening pending  DVT prophylaxis: lovenox Code Status: full Family Communication: Discussed plan of care with the patient and family present at bedside Disposition Plan: likely discharge home in 2-3 days Consults called: None Admission status: Inpatient  Clydie Braunondell A Ioana Louks MD [facility] Pager [phone]   If 7PM-7AM, please contact night-coverage [email or web address] Password [email or web address]  05/06/2019, 4:00 PM

## 2019-05-06 NOTE — ED Notes (Signed)
Only able to collect one blue top culture at this time

## 2019-05-06 NOTE — ED Notes (Signed)
Pt actively vomiting at this time.

## 2019-05-06 NOTE — ED Notes (Signed)
CBG 337 

## 2019-05-07 DIAGNOSIS — N179 Acute kidney failure, unspecified: Secondary | ICD-10-CM

## 2019-05-07 LAB — GLUCOSE, CAPILLARY
Glucose-Capillary: 224 mg/dL — ABNORMAL HIGH (ref 70–99)
Glucose-Capillary: 105 mg/dL — ABNORMAL HIGH (ref 70–99)
Glucose-Capillary: 120 mg/dL — ABNORMAL HIGH (ref 70–99)
Glucose-Capillary: 135 mg/dL — ABNORMAL HIGH (ref 70–99)
Glucose-Capillary: 144 mg/dL — ABNORMAL HIGH (ref 70–99)
Glucose-Capillary: 129 mg/dL — ABNORMAL HIGH (ref 70–99)
Glucose-Capillary: 173 mg/dL — ABNORMAL HIGH (ref 70–99)
Glucose-Capillary: 161 mg/dL — ABNORMAL HIGH (ref 70–99)

## 2019-05-07 LAB — COMPREHENSIVE METABOLIC PANEL
ALT: 27 U/L (ref 0–44)
AST: 18 U/L (ref 15–41)
Albumin: 3 g/dL — ABNORMAL LOW (ref 3.5–5.0)
Alkaline Phosphatase: 85 U/L (ref 38–126)
Anion gap: 9 (ref 5–15)
BUN: 11 mg/dL (ref 6–20)
CO2: 19 mmol/L — ABNORMAL LOW (ref 22–32)
Calcium: 7.6 mg/dL — ABNORMAL LOW (ref 8.9–10.3)
Chloride: 119 mmol/L — ABNORMAL HIGH (ref 98–111)
Creatinine, Ser: 0.57 mg/dL (ref 0.44–1.00)
GFR calc Af Amer: >60 mL/min (ref >60)
GFR calc non Af Amer: >60 mL/min (ref >60)
Glucose, Bld: 156 mg/dL — ABNORMAL HIGH (ref 70–99)
Potassium: 3.1 mmol/L — ABNORMAL LOW (ref 3.5–5.1)
Sodium: 147 mmol/L — ABNORMAL HIGH (ref 135–145)
Total Bilirubin: 0.9 mg/dL (ref 0.3–1.2)
Total Protein: 5.5 g/dL — ABNORMAL LOW (ref 6.5–8.1)

## 2019-05-07 LAB — CBG MONITORING, ED
Glucose-Capillary: 140 mg/dL — ABNORMAL HIGH (ref 70–99)
Glucose-Capillary: 147 mg/dL — ABNORMAL HIGH (ref 70–99)
Glucose-Capillary: 154 mg/dL — ABNORMAL HIGH (ref 70–99)
Glucose-Capillary: 162 mg/dL — ABNORMAL HIGH (ref 70–99)
Glucose-Capillary: 176 mg/dL — ABNORMAL HIGH (ref 70–99)
Glucose-Capillary: 178 mg/dL — ABNORMAL HIGH (ref 70–99)

## 2019-05-07 LAB — HEMOGLOBIN A1C
Hgb A1c MFr Bld: 12.4 % — ABNORMAL HIGH (ref 4.8–5.6)
Mean Plasma Glucose: 309 mg/dL

## 2019-05-07 LAB — LACTIC ACID, PLASMA: Lactic Acid, Venous: 1.6 mmol/L (ref 0.5–1.9)

## 2019-05-07 MED ORDER — INFLUENZA VAC SPLIT QUAD 0.5 ML IM SUSY
0.5000 mL | PREFILLED_SYRINGE | INTRAMUSCULAR | Status: AC
  Administered 2019-05-10: 12:00:00 0.5 mL via INTRAMUSCULAR
  Filled 2019-05-07: qty 0.5

## 2019-05-07 MED ORDER — INSULIN ASPART 100 UNIT/ML ~~LOC~~ SOLN
0.0000 [IU] | Freq: Every day | SUBCUTANEOUS | Status: DC
  Administered 2019-05-09: 22:00:00 2 [IU] via SUBCUTANEOUS

## 2019-05-07 MED ORDER — PNEUMOCOCCAL VAC POLYVALENT 25 MCG/0.5ML IJ INJ
0.5000 mL | INJECTION | INTRAMUSCULAR | Status: AC
  Administered 2019-05-10: 12:00:00 0.5 mL via INTRAMUSCULAR

## 2019-05-07 MED ORDER — INSULIN ASPART 100 UNIT/ML ~~LOC~~ SOLN
0.0000 [IU] | Freq: Three times a day (TID) | SUBCUTANEOUS | Status: DC
  Administered 2019-05-07: 17:00:00 2 [IU] via SUBCUTANEOUS
  Administered 2019-05-08 (×3): 1 [IU] via SUBCUTANEOUS
  Administered 2019-05-09: 16:00:00 5 [IU] via SUBCUTANEOUS
  Administered 2019-05-09: 08:00:00 1 [IU] via SUBCUTANEOUS
  Administered 2019-05-09 – 2019-05-10 (×2): 2 [IU] via SUBCUTANEOUS

## 2019-05-07 MED ORDER — POTASSIUM CHLORIDE CRYS ER 20 MEQ PO TBCR
40.0000 meq | EXTENDED_RELEASE_TABLET | ORAL | Status: AC
  Administered 2019-05-07 (×2): 40 meq via ORAL
  Filled 2019-05-07 (×2): qty 2

## 2019-05-07 MED ORDER — INSULIN DETEMIR 100 UNIT/ML ~~LOC~~ SOLN
15.0000 [IU] | Freq: Every day | SUBCUTANEOUS | Status: DC
  Administered 2019-05-07 – 2019-05-10 (×4): 15 [IU] via SUBCUTANEOUS
  Filled 2019-05-07 (×5): qty 0.15

## 2019-05-07 MED ORDER — LIVING WELL WITH DIABETES BOOK - IN SPANISH
Freq: Once | Status: DC
  Filled 2019-05-07: qty 1

## 2019-05-07 MED ORDER — SODIUM CHLORIDE 0.45 % IV SOLN
INTRAVENOUS | Status: DC
  Administered 2019-05-07 – 2019-05-08 (×3): via INTRAVENOUS

## 2019-05-07 NOTE — Progress Notes (Signed)
Inpatient Diabetes Program Recommendations  AACE/ADA: New Consensus Statement on Inpatient Glycemic Control (2015)  Target Ranges:  Prepandial:   less than 140 mg/dL      Peak postprandial:   less than 180 mg/dL (1-2 hours)      Critically ill patients:  140 - 180 mg/dL   Lab Results  Component Value Date   GLUCAP 129 (H) 05/07/2019   HGBA1C 12.4 (H) 05/06/2019    Review of Glycemic Control  Diabetes history: New diagnosis this admission  Current orders for Inpatient glycemic control:  IV insulin transitioning to SQ  Levemir 15 units Daily Novolog 0-9 units + hs  Inpatient Diabetes Program Recommendations:    Based on IV insulin gtt rate (at least 2 units/hour). Anticipate pt will need more basal insulin. Pt can be placed on Novolog moderate correction as well.  Spoke with patient about new diabetes diagnosis.  Discussed A1C results (12.4%) and explained what an A1C is and informed patient that his current A1C indicates an average glucose over 300 mg/dl over the past 2-3 months. Discussed basic pathophysiology of DM Type 2, basic home care, importance of checking CBGs and maintaining good CBG control to prevent long-term and short-term complications. Reviewed glucose and A1C goals.  Reviewed signs and symptoms of hyperglycemia and hypoglycemia along with treatment for both. Discussed impact of nutrition, exercise, stress, sickness, and medications on diabetes control.   Discussed in detail lifestyle modifications including dietary changes and exercise. Pt had only been eating fruit the past 2 weeks. Ordered Living Well with diabetes booklet and encouraged patient to read through entire book. Informed patient that she will be prescribed insulin at time of d/c.  Showed pt how to administer insulin vial vial and syringe and insulin pen.  Patient will need clinic follow up and insulin. If d/c'd over the weekend pt will need to make appointment at [facility] and get [facility] letter for her  insulins. Also discussed with patient back up insulin at [facility].  Discussed how to check glucose. And where to inject insulin.  Patient will need at time of d/c Glucose meter Insulin Insulin pen needle or syringe Follow up w/PCP   Patient verbalized understanding of information discussed and has no further questions at this time related to diabetes.     RNs to provide ongoing basic DM education at bedside with this patient and engage patient to actively check blood glucose and administer insulin injections.   Thanks, Tama Headings RN, MSN, BC-ADM Inpatient Diabetes Coordinator Team Pager [phone] (8a-5p)

## 2019-05-07 NOTE — ED Notes (Signed)
Checked CBG 176, RN Scott informed

## 2019-05-07 NOTE — ED Notes (Signed)
Report attempted 

## 2019-05-07 NOTE — Progress Notes (Signed)
Patient ID: Christie Martinez, female   DOB: Feb 11, 1971, 48 y.o.   MRN: [id number]  PROGRESS NOTE    Christie Martinez  LKG:[id number] DOB: 1970-11-15 DOA: 05/06/2019 PCP: Patient, No Pcp Per   Brief Narrative:  48 year old female with no past medical history presented with abnormal blood sugar along with left lower quadrant abdominal pain and outpatient CT of the abdomen done by PCP showing acute diverticulitis with localized perforation at the level of distal descending colon.  In the ED, her glucose was 941 with elevated anion gap.  She was started on intravenous fluids, intravenous insulin and antibiotics.  Assessment & Plan:   Sepsis: Present on admission Acute diverticulitis with localized perforation at the level of distal descending colon -Patient was on Bactrim and Flagyl as an outpatient. -CT of the abdomen done by PCP showing acute diverticulitis with localized perforation at the level of distal descending colon. -Currently on Rocephin and Flagyl. -I have consulted general surgery.  Follow cultures.  Pain management.  Continue IV fluids.  DKA/new diagnosis of uncontrolled diabetes mellitus type 2 -Presented with glucose of 941 with elevated anion gap. -Started on insulin drip.  Anion gap is closed.  Will transition to long-acting insulin along with sliding scale insulin.  Follow hemoglobin A1c.  Diabetes coordinator consult. -We will hold off on advancing diet and wait for general surgery recommendations.  Acute kidney injury -Resolved with IV fluids.  Hypokalemia -Replace.  Repeat a.m. labs  DVT prophylaxis: Lovenox Code Status: Full Family Communication: None at bedside Disposition Plan: Depends on clinical outcome  Consultants: General surgery  Procedures: None Antimicrobials: Rocephin and Flagyl from 05/06/2019 onwards   Subjective: Patient seen and examined at bedside.  Complains of mild nausea and lower quadrant pain.  No overnight fever.  No current vomiting.   Objective: Vitals:   05/07/19 0400 05/07/19 0415 05/07/19 0700 05/07/19 0755  BP: 122/79 114/76 113/68 128/82  Pulse: 85 87 85 85  Resp: 18 18 17 19   Temp:    98.2 F (36.8 C)  TempSrc:      SpO2: 100% 99% 98% 100%  Weight:      Height:        Intake/Output Summary (Last 24 hours) at 05/07/2019 1038 Last data filed at 05/07/2019 0031 Gross per 24 hour  Intake 4063.01 ml  Output 800 ml  Net 3263.01 ml   Filed Weights   05/06/19 1128  Weight: 64.9 kg    Examination:  General exam: Appears calm and comfortable. Respiratory system: Bilateral decreased breath sounds at bases Cardiovascular system: S1 & S2 heard, Rate controlled Gastrointestinal system: Abdomen is nondistended, soft and mildly tender in the left lower quadrant.  Bowel sounds are decreased. Extremities: No cyanosis, clubbing, edema  Central nervous system: Alert and oriented. No focal neurological deficits. Moving extremities Skin: No rashes, lesions or ulcers Psychiatry: Flat affect    Data Reviewed: I have personally reviewed following labs and imaging studies  CBC: Recent Labs  Lab 05/06/19 1142 05/06/19 1319 05/07/19 0339  WBC 10.8*  --  10.5  HGB 16.5* 16.7* 12.5  HCT 49.1* 49.0* 36.4  MCV 91.9  --  91.5  PLT 373  --  664   Basic Metabolic Panel: Recent Labs  Lab 05/06/19 1142 05/06/19 1319 05/06/19 2050 05/07/19 0339  NA 134* 136 145 147*  K 5.1 5.0 3.3* 3.1*  CL 98  --  115* 119*  CO2 11*  --  18* 19*  GLUCOSE 941*  --  254* 156*  BUN 24*  --  14 11  CREATININE 1.50*  --  0.69 0.57  CALCIUM 9.0  --  7.7* 7.6*   GFR: Estimated Creatinine Clearance: 72 mL/min (by C-G formula based on SCr of 0.57 mg/dL). Liver Function Tests: Recent Labs  Lab 05/06/19 1142 05/07/19 0339  AST 22 18  ALT 44 27  ALKPHOS 141* 85  BILITOT 1.8* 0.9  PROT 8.0 5.5*  ALBUMIN 4.4 3.0*   Recent Labs  Lab 05/06/19 1142  LIPASE 33   No results for input(s): AMMONIA in the last 168 hours.  Coagulation Profile: Recent Labs  Lab 05/06/19 1441  INR 1.1   Cardiac Enzymes: No results for input(s): CKTOTAL, CKMB, CKMBINDEX, TROPONINI in the last 168 hours. BNP (last 3 results) No results for input(s): PROBNP in the last 8760 hours. HbA1C: Recent Labs    05/06/19 1142  HGBA1C 12.4*   CBG: Recent Labs  Lab 05/07/19 0542 05/07/19 0637 05/07/19 0801 05/07/19 0911 05/07/19 1009  GLUCAP 178* 176* 224* 105* 120*   Lipid Profile: No results for input(s): CHOL, HDL, LDLCALC, TRIG, CHOLHDL, LDLDIRECT in the last 72 hours. Thyroid Function Tests: Recent Labs    05/06/19 1222  TSH 1.408   Anemia Panel: No results for input(s): VITAMINB12, FOLATE, FERRITIN, TIBC, IRON, RETICCTPCT in the last 72 hours. Sepsis Labs: Recent Labs  Lab 05/06/19 1200 05/06/19 1455 05/06/19 2036 05/07/19 0008  LATICACIDVEN 4.2* 2.8* 1.4 1.6    Recent Results (from the past 240 hour(s))  SARS CORONAVIRUS 2 (TAT 6-24 HRS) Nasopharyngeal Nasopharyngeal Swab     Status: None   Collection Time: 05/06/19  2:42 PM   Specimen: Nasopharyngeal Swab  Result Value Ref Range Status   SARS Coronavirus 2 NEGATIVE NEGATIVE Final    Comment: (NOTE) SARS-CoV-2 target nucleic acids are NOT DETECTED. The SARS-CoV-2 RNA is generally detectable in upper and lower respiratory specimens during the acute phase of infection. Negative results do not preclude SARS-CoV-2 infection, do not rule out co-infections with other pathogens, and should not be used as the sole basis for treatment or other patient management decisions. Negative results must be combined with clinical observations, patient history, and epidemiological information. The expected result is Negative. Fact Sheet for Patients: HairSlick.no Fact Sheet for Healthcare Providers: quierodirigir.com This test is not yet approved or cleared by the [place] FDA and  has been authorized for  detection and/or diagnosis of SARS-CoV-2 by FDA under an Emergency Use Authorization (EUA). This EUA will remain  in effect (meaning this test can be used) for the duration of the COVID-19 declaration under Section 56 4(b)(1) of the Act, 21 U.S.C. section 360bbb-3(b)(1), unless the authorization is terminated or revoked sooner. Performed at [facility], [address], [place], [place] [address]   Culture, blood (x 2)     Status: None (Preliminary result)   Collection Time: 05/06/19  2:43 PM   Specimen: BLOOD LEFT HAND  Result Value Ref Range Status   Specimen Description BLOOD LEFT HAND  Final   Special Requests   Final    BOTTLES DRAWN AEROBIC ONLY Blood Culture adequate volume   Culture   Final    NO GROWTH < 24 HOURS Performed at [facility], [address] [place], [place] [address]    Report Status PENDING  Incomplete  Culture, blood (x 2)     Status: None (Preliminary result)   Collection Time: 05/07/19  3:39 AM   Specimen: BLOOD  Result Value Ref  Range Status   Specimen Description BLOOD LEFT HAND  Final   Special Requests   Final    BOTTLES DRAWN AEROBIC AND ANAEROBIC Blood Culture adequate volume   Culture   Final    NO GROWTH < 12 HOURS Performed at [facility], [address] [place], [place] [address]    Report Status PENDING  Incomplete         Radiology Studies: No results found.      Scheduled Meds: . enoxaparin (LOVENOX) injection  40 mg Subcutaneous Daily  . [START ON 05/08/2019] influenza vac split quadrivalent PF  0.5 mL Intramuscular Tomorrow-1000  . insulin aspart  0-5 Units Subcutaneous QHS  . insulin aspart  0-9 Units Subcutaneous TID WC  . insulin detemir  15 Units Subcutaneous Daily  . [START ON 05/08/2019] pneumococcal 23 valent vaccine  0.5 mL Intramuscular Tomorrow-1000  . potassium chloride  40 mEq Oral Q4H  . sodium chloride flush  3 mL Intravenous Q12H   Continuous Infusions: . sodium chloride Stopped  (05/07/19 0423)  . cefTRIAXone (ROCEPHIN)  IV    . dextrose 5 % and 0.45% NaCl 125 mL/hr at 05/06/19 2100  . insulin 2.3 Units/hr (05/07/19 1013)  . metronidazole Stopped (05/07/19 [id number])          Glade LloydKshitiz Estefan Pattison, MD [facility] 05/07/2019, 10:38 AM

## 2019-05-07 NOTE — ED Notes (Signed)
ED TO INPATIENT HANDOFF REPORT  ED Nurse Name and Phone #: Zelphia [place] Name/Age/Gender Christie Martinez 48 y.o. female Room/Bed: 032C/032C  Code Status   Code Status: Full Code  Home/SNF/Other Home Patient oriented to: self, place, time and situation Is this baseline? Yes   Triage Complete: Triage complete  Chief Complaint ABD PAIN HERE FOR CT  Triage Note Pt was sent here by her PCP for a CT of her abd. Pt presents with left lower abd pain for 3 weeks.    Allergies No Known Allergies  Level of Care/Admitting Diagnosis ED Disposition    ED Disposition Condition [facility]: [place] [[id number]]  Level of Care: Progressive [102]  Covid Evaluation: Asymptomatic Screening Protocol (No Symptoms)  Diagnosis: Sepsis [facility]) [[id number]]  Admitting Physician: Norval Morton [[id number]]  Attending Physician: Norval Morton [[id number]]  Estimated length of stay: past midnight tomorrow  Certification:: I certify this patient will need inpatient services for at least 2 midnights  PT Class (Do Not Modify): Inpatient [101]  PT Acc Code (Do Not Modify): Private [1]       B Medical/Surgery History History reviewed. No pertinent past medical history. History reviewed. No pertinent surgical history.   A IV Location/Drains/Wounds Patient Lines/Drains/Airways Status   Active Line/Drains/Airways    Name:   Placement date:   Placement time:   Site:   Days:   Peripheral IV 05/06/19 Right Antecubital   05/06/19    1154    Antecubital   1   Peripheral IV 05/06/19 Left Forearm   05/06/19    2112    Forearm   1          Intake/Output Last 24 hours  Intake/Output Summary (Last 24 hours) at 05/07/2019 0734 Last data filed at 05/07/2019 0031 Gross per 24 hour  Intake 4063.01 ml  Output 800 ml  Net 3263.01 ml    Labs/Imaging Results for orders placed or performed during the hospital encounter of 05/06/19 (from the past 48 hour(s))   Lipase, blood     Status: None   Collection Time: 05/06/19 11:42 AM  Result Value Ref Range   Lipase 33 11 - 51 U/L    Comment: Performed at [facility], [address], [place], [place] [address]  Comprehensive metabolic panel     Status: Abnormal   Collection Time: 05/06/19 11:42 AM  Result Value Ref Range   Sodium 134 (L) 135 - 145 mmol/L   Potassium 5.1 3.5 - 5.1 mmol/L   Chloride 98 98 - 111 mmol/L   CO2 11 (L) 22 - 32 mmol/L   Glucose, Bld 941 (HH) 70 - 99 mg/dL    Comment: CRITICAL RESULT CALLED TO, READ BACK BY AND VERIFIED WITH: BERTRAND,S RN @1240  ON [id number] BY FLEMINGS    BUN 24 (H) 6 - 20 mg/dL   Creatinine, Ser 1.50 (H) 0.44 - 1.00 mg/dL   Calcium 9.0 8.9 - 10.3 mg/dL   Total Protein 8.0 6.5 - 8.1 g/dL   Albumin 4.4 3.5 - 5.0 g/dL   AST 22 15 - 41 U/L   ALT 44 0 - 44 U/L   Alkaline Phosphatase 141 (H) 38 - 126 U/L   Total Bilirubin 1.8 (H) 0.3 - 1.2 mg/dL   GFR calc non Af Amer 41 (L) >60 mL/min   GFR calc Af Amer 48 (L) >60 mL/min   Anion gap 25 (H) 5 - 15  Comment: Performed at [facility], [address], [place], [place] [address]  CBC     Status: Abnormal   Collection Time: 05/06/19 11:42 AM  Result Value Ref Range   WBC 10.8 (H) 4.0 - 10.5 K/uL   RBC 5.34 (H) 3.87 - 5.11 MIL/uL   Hemoglobin 16.5 (H) 12.0 - 15.0 g/dL   HCT 81.2 (H) 75.1 - 70.0 %   MCV 91.9 80.0 - 100.0 fL   MCH 30.9 26.0 - 34.0 pg   MCHC 33.6 30.0 - 36.0 g/dL   RDW 17.4 94.4 - 96.7 %   Platelets 373 150 - 400 K/uL   nRBC 0.0 0.0 - 0.2 %    Comment: Performed at [facility], [address], [place], [place] [address]  I-Stat beta hCG blood, ED     Status: None   Collection Time: 05/06/19 11:52 AM  Result Value Ref Range   I-stat hCG, quantitative <5.0 <5 mIU/mL   Comment 3            Comment:   GEST. AGE      CONC.  (mIU/mL)   <=1 WEEK        5 - 50     2 WEEKS       50 - 500     3 WEEKS       100 - 10,000     4 WEEKS     1,000 - 30,000        FEMALE AND  NON-PREGNANT FEMALE:     LESS THAN 5 mIU/mL   Lactic acid, plasma     Status: Abnormal   Collection Time: 05/06/19 12:00 PM  Result Value Ref Range   Lactic Acid, Venous 4.2 (HH) 0.5 - 1.9 mmol/L    Comment: CRITICAL RESULT CALLED TO, READ BACK BY AND VERIFIED WITH: L. MEEKS,RN AT 1314 05/06/2019 BY ZBEECH. Performed at [facility], [address] [place], [place] [address]   TSH     Status: None   Collection Time: 05/06/19 12:22 PM  Result Value Ref Range   TSH 1.408 0.350 - 4.500 uIU/mL    Comment: Performed by a 3rd Generation assay with a functional sensitivity of <=0.01 uIU/mL. Performed at [facility], [address], [place], [place] [address]   Urinalysis, Routine w reflex microscopic     Status: Abnormal   Collection Time: 05/06/19 12:25 PM  Result Value Ref Range   Color, Urine STRAW (A) YELLOW   APPearance CLEAR CLEAR   Specific Gravity, Urine 1.030 1.005 - 1.030   pH 5.0 5.0 - 8.0   Glucose, UA >=500 (A) NEGATIVE mg/dL   Hgb urine dipstick NEGATIVE NEGATIVE   Bilirubin Urine NEGATIVE NEGATIVE   Ketones, ur 80 (A) NEGATIVE mg/dL   Protein, ur NEGATIVE NEGATIVE mg/dL   Nitrite NEGATIVE NEGATIVE   Leukocytes,Ua NEGATIVE NEGATIVE   RBC / HPF 0-5 0 - 5 RBC/hpf   WBC, UA 0-5 0 - 5 WBC/hpf   Bacteria, UA RARE (A) NONE SEEN   Squamous Epithelial / LPF 0-5 0 - 5    Comment: Performed at [facility], [address], [place], [place] [address]  POCT I-Stat EG7     Status: Abnormal   Collection Time: 05/06/19  1:19 PM  Result Value Ref Range   pH, Ven 7.316 7.250 - 7.430   pCO2, Ven 27.0 (L) 44.0 - 60.0 mmHg   pO2, Ven 190.0 (H) 32.0 -  45.0 mmHg   Bicarbonate 13.8 (L) 20.0 - 28.0 mmol/L   TCO2 15 (L) 22 - 32 mmol/L   O2 Saturation 100.0 %   Acid-base deficit 10.0 (H) 0.0 - 2.0 mmol/L   Sodium 136 135 - 145 mmol/L   Potassium 5.0 3.5 - 5.1 mmol/L   Calcium, Ion 1.10 (L) 1.15 - 1.40 mmol/L   HCT 49.0 (H) 36.0 - 46.0 %   Hemoglobin 16.7 (H) 12.0 -  15.0 g/dL   Patient temperature HIDE    Sample type VENOUS   CBG monitoring, ED     Status: Abnormal   Collection Time: 05/06/19  2:02 PM  Result Value Ref Range   Glucose-Capillary >600 (HH) 70 - 99 mg/dL  HIV Antibody (routine testing w rflx)     Status: None   Collection Time: 05/06/19  2:41 PM  Result Value Ref Range   HIV Screen 4th Generation wRfx NON REACTIVE NON REACTIVE    Comment: Performed at [facility], [address] [place], [place] [address]  Protime-INR     Status: None   Collection Time: 05/06/19  2:41 PM  Result Value Ref Range   Prothrombin Time 14.4 11.4 - 15.2 seconds   INR 1.1 0.8 - 1.2    Comment: (NOTE) INR goal varies based on device and disease states. Performed at [facility], [address] [place], [place] [address]   APTT     Status: Abnormal   Collection Time: 05/06/19  2:41 PM  Result Value Ref Range   aPTT 23 (L) 24 - 36 seconds    Comment: Performed at [facility], [address] [place], [place] [address]  SARS CORONAVIRUS 2 (TAT 6-24 HRS) Nasopharyngeal Nasopharyngeal Swab     Status: None   Collection Time: 05/06/19  2:42 PM   Specimen: Nasopharyngeal Swab  Result Value Ref Range   SARS Coronavirus 2 NEGATIVE NEGATIVE    Comment: (NOTE) SARS-CoV-2 target nucleic acids are NOT DETECTED. The SARS-CoV-2 RNA is generally detectable in upper and lower respiratory specimens during the acute phase of infection. Negative results do not preclude SARS-CoV-2 infection, do not rule out co-infections with other pathogens, and should not be used as the sole basis for treatment or other patient management decisions. Negative results must be combined with clinical observations, patient history, and epidemiological information. The expected result is Negative. Fact Sheet for Patients: HairSlick.nohttps://www.fda.gov/media/138098/download Fact Sheet for Healthcare Providers: quierodirigir.comhttps://www.fda.gov/media/138095/download This test is not yet  approved or cleared by the [place] FDA and  has been authorized for detection and/or diagnosis of SARS-CoV-2 by FDA under an Emergency Use Authorization (EUA). This EUA will remain  in effect (meaning this test can be used) for the duration of the COVID-19 declaration under Section 56 4(b)(1) of the Act, 21 U.S.C. section 360bbb-3(b)(1), unless the authorization is terminated or revoked sooner. Performed at [facility], [address] [place], [place] [address]   Lactic acid, plasma     Status: Abnormal   Collection Time: 05/06/19  2:55 PM  Result Value Ref Range   Lactic Acid, Venous 2.8 (HH) 0.5 - 1.9 mmol/L    Comment: CRITICAL VALUE NOTED.  VALUE IS CONSISTENT WITH PREVIOUSLY REPORTED AND CALLED VALUE. Performed at [facility], [address] [place], [place] [address]   CBG monitoring, ED     Status: Abnormal   Collection Time: 05/06/19  3:19 PM  Result Value Ref Range   Glucose-Capillary 448 (  H) 70 - 99 mg/dL  CBG monitoring, ED     Status: Abnormal   Collection Time: 05/06/19  4:05 PM  Result Value Ref Range   Glucose-Capillary 411 (H) 70 - 99 mg/dL  CBG monitoring, ED     Status: Abnormal   Collection Time: 05/06/19  5:14 PM  Result Value Ref Range   Glucose-Capillary 330 (H) 70 - 99 mg/dL  CBG monitoring, ED     Status: Abnormal   Collection Time: 05/06/19  6:43 PM  Result Value Ref Range   Glucose-Capillary 371 (H) 70 - 99 mg/dL   Comment 1 Document in Chart   CBG monitoring, ED     Status: Abnormal   Collection Time: 05/06/19  7:47 PM  Result Value Ref Range   Glucose-Capillary 281 (H) 70 - 99 mg/dL  Lactic acid, plasma     Status: None   Collection Time: 05/06/19  8:36 PM  Result Value Ref Range   Lactic Acid, Venous 1.4 0.5 - 1.9 mmol/L    Comment: Performed at [facility], [address] [place], [place] [address]  CBG monitoring, ED     Status: Abnormal   Collection Time: 05/06/19  8:46 PM  Result Value Ref Range    Glucose-Capillary 231 (H) 70 - 99 mg/dL   Comment 1 Notify RN    Comment 2 Document in Chart   Basic metabolic panel     Status: Abnormal   Collection Time: 05/06/19  8:50 PM  Result Value Ref Range   Sodium 145 135 - 145 mmol/L   Potassium 3.3 (L) 3.5 - 5.1 mmol/L   Chloride 115 (H) 98 - 111 mmol/L   CO2 18 (L) 22 - 32 mmol/L   Glucose, Bld 254 (H) 70 - 99 mg/dL   BUN 14 6 - 20 mg/dL   Creatinine, Ser 1.91 0.44 - 1.00 mg/dL   Calcium 7.7 (L) 8.9 - 10.3 mg/dL   GFR calc non Af Amer >60 >60 mL/min   GFR calc Af Amer >60 >60 mL/min   Anion gap 12 5 - 15    Comment: Performed at [facility], [address] [place], [place] [address]  CBG monitoring, ED     Status: Abnormal   Collection Time: 05/06/19 10:06 PM  Result Value Ref Range   Glucose-Capillary 220 (H) 70 - 99 mg/dL  CBG monitoring, ED     Status: Abnormal   Collection Time: 05/06/19 11:14 PM  Result Value Ref Range   Glucose-Capillary 180 (H) 70 - 99 mg/dL  Lactic acid, plasma     Status: None   Collection Time: 05/07/19 12:08 AM  Result Value Ref Range   Lactic Acid, Venous 1.6 0.5 - 1.9 mmol/L    Comment: Performed at [facility], [address], [place], [place] [address]  CBG monitoring, ED     Status: Abnormal   Collection Time: 05/07/19 12:17 AM  Result Value Ref Range   Glucose-Capillary 162 (H) 70 - 99 mg/dL  CBG monitoring, ED     Status: Abnormal   Collection Time: 05/07/19  1:18 AM  Result Value Ref Range   Glucose-Capillary 140 (H) 70 - 99 mg/dL  CBG monitoring, ED     Status: Abnormal   Collection Time: 05/07/19  2:29 AM  Result Value Ref Range   Glucose-Capillary 154 (H) 70 - 99 mg/dL  CBC     Status: None   Collection Time: 05/07/19  3:39 AM  Result Value Ref Range   WBC 10.5 4.0 - 10.5 K/uL   RBC 3.98 3.87 - 5.11 MIL/uL   Hemoglobin 12.5 12.0 - 15.0 g/dL   HCT 96.0 45.4 - 09.8 %   MCV 91.5 80.0 - 100.0 fL   MCH 31.4 26.0 - 34.0 pg   MCHC 34.3 30.0 - 36.0 g/dL   RDW 11.9 14.7 -  82.9 %   Platelets 242 150 - 400 K/uL   nRBC 0.0 0.0 - 0.2 %    Comment: Performed at [facility], [address], [place], [place] [address]  Comprehensive metabolic panel     Status: Abnormal   Collection Time: 05/07/19  3:39 AM  Result Value Ref Range   Sodium 147 (H) 135 - 145 mmol/L   Potassium 3.1 (L) 3.5 - 5.1 mmol/L   Chloride 119 (H) 98 - 111 mmol/L   CO2 19 (L) 22 - 32 mmol/L   Glucose, Bld 156 (H) 70 - 99 mg/dL   BUN 11 6 - 20 mg/dL   Creatinine, Ser 0.86 0.44 - 1.00 mg/dL   Calcium 7.6 (L) 8.9 - 10.3 mg/dL   Total Protein 5.5 (L) 6.5 - 8.1 g/dL   Albumin 3.0 (L) 3.5 - 5.0 g/dL   AST 18 15 - 41 U/L   ALT 27 0 - 44 U/L   Alkaline Phosphatase 85 38 - 126 U/L   Total Bilirubin 0.9 0.3 - 1.2 mg/dL   GFR calc non Af Amer >60 >60 mL/min   GFR calc Af Amer >60 >60 mL/min   Anion gap 9 5 - 15    Comment: Performed at [facility], [address] [place], [place] [address]  CBG monitoring, ED     Status: Abnormal   Collection Time: 05/07/19  3:54 AM  Result Value Ref Range   Glucose-Capillary 147 (H) 70 - 99 mg/dL  CBG monitoring, ED     Status: Abnormal   Collection Time: 05/07/19  5:42 AM  Result Value Ref Range   Glucose-Capillary 178 (H) 70 - 99 mg/dL   Comment 1 Notify RN   CBG monitoring, ED     Status: Abnormal   Collection Time: 05/07/19  6:37 AM  Result Value Ref Range   Glucose-Capillary 176 (H) 70 - 99 mg/dL   Comment 1 Notify RN    No results found.  Pending Labs Unresulted Labs (From admission, onward)    Start     Ordered   05/06/19 1424  Hemoglobin A1c  Add-on,   AD     05/06/19 1423   05/06/19 1417  Culture, blood (x 2)  BLOOD CULTURE X 2,   STAT    Comments: INITIATE ANTIBIOTICS WITHIN 1 HOUR AFTER BLOOD CULTURES DRAWN.  If unable to obtain blood cultures, call MD immediately regarding antibiotic instructions.    05/06/19 1418   05/06/19 1410  HIV4GL Save Tube  (HIV Antibody (Routine testing w reflex) panel)  Once,   STAT      05/06/19 1413          Vitals/Pain Today's Vitals   05/07/19 0423 05/07/19 0525 05/07/19 0625 05/07/19 0700  BP:    113/68  Pulse:    85  Resp:    17  Temp:      TempSrc:      SpO2:    98%  Weight:      Height:      PainSc: 0-No pain 0-No pain 0-No pain  Isolation Precautions No active isolations  Medications Medications  dextrose 5 %-0.45 % sodium chloride infusion ( Intravenous New Bag/Given 05/06/19 2100)  insulin regular, human (MYXREDLIN) 100 units/ 100 mL infusion (4.4 Units/hr Intravenous Rate/Dose Change 05/07/19 0400)  dextrose 50 % solution 25 mL (has no administration in time range)  enoxaparin (LOVENOX) injection 40 mg (has no administration in time range)  sodium chloride flush (NS) 0.9 % injection 3 mL (3 mLs Intravenous Not Given 05/06/19 2101)  acetaminophen (TYLENOL) tablet 650 mg (has no administration in time range)    Or  acetaminophen (TYLENOL) suppository 650 mg (has no administration in time range)  ondansetron (ZOFRAN) tablet 4 mg ( Oral See Alternative 05/06/19 2017)    Or  ondansetron (ZOFRAN) injection 4 mg (4 mg Intravenous Given 05/06/19 2017)  ipratropium-albuterol (DUONEB) 0.5-2.5 (3) MG/3ML nebulizer solution 3 mL (has no administration in time range)  0.9 %  sodium chloride infusion ( Intravenous Stopped 05/07/19 0423)  cefTRIAXone (ROCEPHIN) 2 g in sodium chloride 0.9 % 100 mL IVPB (has no administration in time range)  metroNIDAZOLE (FLAGYL) IVPB 500 mg (500 mg Intravenous New Bag/Given 05/07/19 0703)  morphine 2 MG/ML injection 2 mg (has no administration in time range)  lactated ringers bolus 2,000 mL (0 mLs Intravenous Stopped 05/06/19 1444)  cefTRIAXone (ROCEPHIN) 2 g in sodium chloride 0.9 % 100 mL IVPB (0 g Intravenous Stopped 05/06/19 1638)    And  metroNIDAZOLE (FLAGYL) IVPB 500 mg (0 mg Intravenous Stopped 05/06/19 1446)  sodium chloride 0.9 % bolus 1,000 mL (0 mLs Intravenous Stopped 05/06/19 1846)  sodium chloride 0.9 % bolus 1,000  mL (0 mLs Intravenous Stopped 05/06/19 2029)  sodium chloride 0.9 % bolus 500 mL (0 mLs Intravenous Stopped 05/07/19 0300)    Mobility walks with person assist Moderate fall risk   Focused Assessments Cardiac Assessment Handoff:    No results found for: CKTOTAL, CKMB, CKMBINDEX, TROPONINI No results found for: DDIMER Does the Patient currently have chest pain? No      R Recommendations: See Admitting Provider Note  Report given to:   Additional Notes:

## 2019-05-07 NOTE — ED Notes (Signed)
Checked CBG 178, RN Scott informed

## 2019-05-07 NOTE — Consult Note (Addendum)
[facility] Surgery Consult Note  Christie Martinez 1971/07/05  [id number].    Requesting MD: Geannie Risen Chief Complaint: Left lower abdominal pain x3 weeks Reason for Consult: Perforated diverticulitis PCP: Patient, No Pcp Per HPI: Patient is a 48 year old female with a family history of diabetes who was evaluated by her primary care at [facility] yesterday.  She presented with abdominal pain in the left lower quadrant.  CT scan revealed of diverticulitis with localized perforation at the level of the descending colon a small hiatal hernia, calcified right hepatic granuloma a left uterine calcification and right ovarian cyst. She also had a glucose of 549.  She has a family history of diabetes but was never diagnosed with this herself.  She reported nausea of, vomiting, urinary frequency, increased thirst, malaise and a 20 pound weight loss over 3-week.  Work-up showed patient had a perforated diverticulosis.  She was started on Bactrim, but apparently had progression of her symptoms and was sent to the ED.  Work-up in the ED here at [facility] shows she is afebrile T-max 99.7, tachycardic initial blood pressure was elevated, this is improved with treatment over the evening.  Her first CBG was greater than 600, lactate 2.8, INR 1.1, PTT 23 seconds, HIV is nonreactive, blood cultures are pending.  COVID is negative.  Blood gases yesterday afternoon showed a pH is 7.31, PCO2 27, PO2 of 190, bicarb of 13.8, CO2 15.  WBC 10.8, hemoglobin 16.5, hematocrit 49.1.  Platelets are 373,000. Since admission she has been placed on IV insulin, IV fluids, IV Rocephin and Flagyl.  This a.m. her CMP is improved with glucoses down into the 170 range.,  Potassium 3.1, WBC 10.5, hemoglobin 12.5, hematocrit 36.4, platelets 242,000.  The CT is not currently available.  I have asked the hospitalist to try and obtain the study for [place] to review.  There is a study from 11/26/2016 that showed diverticulosis  but no diverticulitis.  Currently the nursing staff is trying to find the reports from [place] [facility].  I examined the patient as she is not very tender and says the pain is markedly improved.  CT urogram [facility] report, 05/05/2019: Clear lung base, small hiatal hernia, calcified right hepatic lobe granuloma appearing gallbladder pancreas spleen adrenals no renal masses calculus or obstructive uropathy normal urinary bladder, anteverted uterus with 4.5 mm fundal focal calcification, 2.5 cm right ovarian cyst, diverticulosis with increased attenuation within the distal descending paracolic fat and small amount of localized free air consistent with acute diverticulitis with focal perforation.  No abscess identified, normal appendix, normal abdominal aortic and iliac arteries.  No aggressive osseous lesions.   Impression:  acute diverticulitis with localized perforation at the level of the distal descending colon small hiatal hernia, calcified right hepatic lobe granuloma, 4.5 mm left fundal uterine calcification and 2.5 cm right ovarian cyst.   ROS: Review of Systems  Constitutional: Positive for weight loss. Negative for chills, diaphoresis, fever and malaise/fatigue.  HENT: Negative.   Eyes: Negative.   Respiratory: Negative.   Cardiovascular: Negative.   Gastrointestinal: Positive for abdominal pain.  Genitourinary: Negative.   Musculoskeletal: Negative.   Skin: Negative.   Neurological: Negative.   Endo/Heme/Allergies: Positive for polydipsia.  Psychiatric/Behavioral: Negative.     Family History  Problem Relation Age of Onset  . Diabetes Father     History reviewed. No pertinent past medical history.  History reviewed. No pertinent surgical history.  Social History:  reports that she has never  smoked. She has never used smokeless tobacco. She reports that she does not drink alcohol or use drugs.  Allergies: No Known Allergies  Prior to Admission medications    Medication Sig Start Date End Date Taking? Authorizing Provider  metroNIDAZOLE (FLAGYL) 500 MG tablet Take 1 tablet (500 mg total) by mouth 2 (two) times daily. Patient taking differently: Take 500 mg by mouth 3 (three) times daily. Pt started 05-04-19 for 7 day therapy. 11/27/16  Yes Audry Pili, PA-C  ondansetron (ZOFRAN) 4 MG tablet Take 4 mg by mouth every 8 (eight) hours as needed for nausea or vomiting.   Yes [provider]  sulfamethoxazole-trimethoprim (BACTRIM DS) 800-160 MG tablet Take 1 tablet by mouth 2 (two) times daily. Pt started on 05-04-19 for 7 day therapy.   Yes [provider]  ibuprofen (ADVIL,MOTRIN) 600 MG tablet Take 1 tablet (600 mg total) by mouth every 6 (six) hours as needed. Patient not taking: Reported on 05/06/2019 11/27/16   Audry Pili, PA-C  ondansetron (ZOFRAN ODT) 4 MG disintegrating tablet Take 1 tablet (4 mg total) by mouth every 8 (eight) hours as needed for nausea or vomiting. Patient not taking: Reported on 05/06/2019 11/27/16   Audry Pili, PA-C     Blood pressure 113/68, pulse 85, temperature 99.7 F (37.6 C), temperature source Oral, resp. rate 17, height  (1.499 m), weight 64.9 kg, SpO2 98 %. Physical Exam: Physical Exam Constitutional:      General: She is not in acute distress.    Appearance: She is well-developed. She is not ill-appearing, toxic-appearing or diaphoretic.     Comments: Patient just arrived to the floor from the ED but is quite comfortable.  HENT:     Head: Normocephalic.  Eyes:     General: No scleral icterus.    Comments: Pupils are equal  Cardiovascular:     Rate and Rhythm: Normal rate and regular rhythm.     Heart sounds: Normal heart sounds. No murmur.  Pulmonary:     Effort: Pulmonary effort is normal. No respiratory distress.     Breath sounds: Normal breath sounds. No stridor. No wheezing, rhonchi or rales.  Chest:     Chest wall: No tenderness.  Abdominal:     General: Abdomen is flat. Bowel  sounds are decreased. There is no distension or abdominal bruit.     Palpations: Abdomen is soft.     Tenderness: There is abdominal tenderness in the left lower quadrant.     Hernia: No hernia is present.  Skin:    General: Skin is warm and dry.     Capillary Refill: Capillary refill takes less than 2 seconds.  Neurological:     General: No focal deficit present.     Mental Status: She is alert and oriented to person, place, and time.  Psychiatric:        Mood and Affect: Mood normal. Mood is not anxious or depressed.        Behavior: Behavior normal.     Results for orders placed or performed during the hospital encounter of 05/06/19 (from the past 48 hour(s))  Lipase, blood     Status: None   Collection Time: 05/06/19 11:42 AM  Result Value Ref Range   Lipase 33 11 - 51 U/L    Comment: Performed at [facility], [address], [place], [place] [address]  Comprehensive metabolic panel     Status: Abnormal   Collection Time: 05/06/19 11:42 AM  Result  Value Ref Range   Sodium 134 (L) 135 - 145 mmol/L   Potassium 5.1 3.5 - 5.1 mmol/L   Chloride 98 98 - 111 mmol/L   CO2 11 (L) 22 - 32 mmol/L   Glucose, Bld 941 (HH) 70 - 99 mg/dL    Comment: CRITICAL RESULT CALLED TO, READ BACK BY AND VERIFIED WITH: BERTRAND,S RN @1240  ON BY FLEMINGS    BUN 24 (H) 6 - 20 mg/dL   Creatinine, Ser [id number] (H) 0.44 - 1.00 mg/dL   Calcium 9.0 8.9 - 7.10 mg/dL   Total Protein 8.0 6.5 - 8.1 g/dL   Albumin 4.4 3.5 - 5.0 g/dL   AST 22 15 - 41 U/L   ALT 44 0 - 44 U/L   Alkaline Phosphatase 141 (H) 38 - 126 U/L   Total Bilirubin 1.8 (H) 0.3 - 1.2 mg/dL   GFR calc non Af Amer 41 (L) >60 mL/min   GFR calc Af Amer 48 (L) >60 mL/min   Anion gap 25 (H) 5 - 15    Comment: Performed at [facility], [address] [place], [place]  CBC     Status: Abnormal   Collection Time: 05/06/19 11:42 AM  Result Value Ref Range   WBC 10.8 (H) 4.0 - 10.5 K/uL   RBC 5.34 (H) 3.87 - 5.11  MIL/uL   Hemoglobin 16.5 (H) 12.0 - 15.0 g/dL   HCT 07/06/19 (H) 62.7 - 03.5 %   MCV 91.9 80.0 - 100.0 fL   MCH 30.9 26.0 - 34.0 pg   MCHC 33.6 30.0 - 36.0 g/dL   RDW 00.9 38.1 - 82.9 %   Platelets 373 150 - 400 K/uL   nRBC 0.0 0.0 - 0.2 %    Comment: Performed at [facility], [address], [place], [place]  I-Stat beta hCG blood, ED     Status: None   Collection Time: 05/06/19 11:52 AM  Result Value Ref Range   I-stat hCG, quantitative <5.0 <5 mIU/mL   Comment 3            Comment:   GEST. AGE      CONC.  (mIU/mL)   <=1 WEEK        5 - 50     2 WEEKS       50 - 500     3 WEEKS       100 - 10,000     4 WEEKS     1,000 - 30,000        FEMALE AND NON-PREGNANT FEMALE:     LESS THAN 5 mIU/mL   Lactic acid, plasma     Status: Abnormal   Collection Time: 05/06/19 12:00 PM  Result Value Ref Range   Lactic Acid, Venous 4.2 (HH) 0.5 - 1.9 mmol/L    Comment: CRITICAL RESULT CALLED TO, READ BACK BY AND VERIFIED WITH: L. MEEKS,RN AT 1314 05/06/2019 BY ZBEECH. Performed at [facility], [address], [place], [place]   TSH     Status: None   Collection Time: 05/06/19 12:22 PM  Result Value Ref Range   TSH 1.408 0.350 - 4.500 uIU/mL    Comment: Performed by a 3rd Generation assay with a functional sensitivity of <=0.01 uIU/mL. Performed at [facility], [address], [place], [place]   Urinalysis, Routine w reflex microscopic     Status: Abnormal   Collection Time: 05/06/19 12:25  PM  Result Value Ref Range   Color, Urine STRAW (A) YELLOW   APPearance CLEAR CLEAR   Specific Gravity, Urine 1.030 1.005 - 1.030   pH 5.0 5.0 - 8.0   Glucose, UA >=500 (A) NEGATIVE mg/dL   Hgb urine dipstick NEGATIVE NEGATIVE   Bilirubin Urine NEGATIVE NEGATIVE   Ketones, ur 80 (A) NEGATIVE mg/dL   Protein, ur NEGATIVE NEGATIVE mg/dL   Nitrite NEGATIVE NEGATIVE   Leukocytes,Ua NEGATIVE NEGATIVE   RBC / HPF 0-5 0 - 5 RBC/hpf   WBC, UA 0-5 0 - 5 WBC/hpf    Bacteria, UA RARE (A) NONE SEEN   Squamous Epithelial / LPF 0-5 0 - 5    Comment: Performed at [facility], [address], [place], [place] [address]  POCT I-Stat EG7     Status: Abnormal   Collection Time: 05/06/19  1:19 PM  Result Value Ref Range   pH, Ven 7.316 7.250 - 7.430   pCO2, Ven 27.0 (L) 44.0 - 60.0 mmHg   pO2, Ven 190.0 (H) 32.0 - 45.0 mmHg   Bicarbonate 13.8 (L) 20.0 - 28.0 mmol/L   TCO2 15 (L) 22 - 32 mmol/L   O2 Saturation 100.0 %   Acid-base deficit 10.0 (H) 0.0 - 2.0 mmol/L   Sodium 136 135 - 145 mmol/L   Potassium 5.0 3.5 - 5.1 mmol/L   Calcium, Ion 1.10 (L) 1.15 - 1.40 mmol/L   HCT 49.0 (H) 36.0 - 46.0 %   Hemoglobin 16.7 (H) 12.0 - 15.0 g/dL   Patient temperature HIDE    Sample type VENOUS   CBG monitoring, ED     Status: Abnormal   Collection Time: 05/06/19  2:02 PM  Result Value Ref Range   Glucose-Capillary >600 (HH) 70 - 99 mg/dL  HIV Antibody (routine testing w rflx)     Status: None   Collection Time: 05/06/19  2:41 PM  Result Value Ref Range   HIV Screen 4th Generation wRfx NON REACTIVE NON REACTIVE    Comment: Performed at [facility], [address] [place], [place] [address]  Protime-INR     Status: None   Collection Time: 05/06/19  2:41 PM  Result Value Ref Range   Prothrombin Time 14.4 11.4 - 15.2 seconds   INR 1.1 0.8 - 1.2    Comment: (NOTE) INR goal varies based on device and disease states. Performed at [facility], [address], [place], [place] [address]   APTT     Status: Abnormal   Collection Time: 05/06/19  2:41 PM  Result Value Ref Range   aPTT 23 (L) 24 - 36 seconds    Comment: Performed at [facility], [address] [place], [place] [address]  SARS CORONAVIRUS 2 (TAT 6-24 HRS) Nasopharyngeal Nasopharyngeal Swab     Status: None   Collection Time: 05/06/19  2:42 PM   Specimen: Nasopharyngeal Swab  Result Value Ref Range   SARS Coronavirus 2 NEGATIVE NEGATIVE    Comment: (NOTE) SARS-CoV-2  target nucleic acids are NOT DETECTED. The SARS-CoV-2 RNA is generally detectable in upper and lower respiratory specimens during the acute phase of infection. Negative results do not preclude SARS-CoV-2 infection, do not rule out co-infections with other pathogens, and should not be used as the sole basis for treatment or other patient management decisions. Negative results must be combined with clinical observations, patient history, and epidemiological information. The expected result is Negative. Fact Sheet for Patients: HairSlick.no  Fact Sheet for Healthcare Providers: https://www.woods-mathews.com/ This test is not yet approved or cleared by the [place] FDA and  has been authorized for detection and/or diagnosis of SARS-CoV-2 by FDA under an Emergency Use Authorization (EUA). This EUA will remain  in effect (meaning this test can be used) for the duration of the COVID-19 declaration under Section 56 4(b)(1) of the Act, 21 U.S.C. section 360bbb-3(b)(1), unless the authorization is terminated or revoked sooner. Performed at [facility], [place] [address] [place], [place] [address]   Lactic acid, plasma     Status: Abnormal   Collection Time: 05/06/19  2:55 PM  Result Value Ref Range   Lactic Acid, Venous 2.8 (HH) 0.5 - 1.9 mmol/L    Comment: CRITICAL VALUE NOTED.  VALUE IS CONSISTENT WITH PREVIOUSLY REPORTED AND CALLED VALUE. Performed at [facility], [place] [address], [place], [place] [address]   CBG monitoring, ED     Status: Abnormal   Collection Time: 05/06/19  3:19 PM  Result Value Ref Range   Glucose-Capillary 448 (H) 70 - 99 mg/dL  CBG monitoring, ED     Status: Abnormal   Collection Time: 05/06/19  4:05 PM  Result Value Ref Range   Glucose-Capillary 411 (H) 70 - 99 mg/dL  CBG monitoring, ED     Status: Abnormal   Collection Time: 05/06/19  5:14 PM  Result Value Ref Range   Glucose-Capillary 330 (H) 70 - 99  mg/dL  CBG monitoring, ED     Status: Abnormal   Collection Time: 05/06/19  6:43 PM  Result Value Ref Range   Glucose-Capillary 371 (H) 70 - 99 mg/dL   Comment 1 Document in Chart   CBG monitoring, ED     Status: Abnormal   Collection Time: 05/06/19  7:47 PM  Result Value Ref Range   Glucose-Capillary 281 (H) 70 - 99 mg/dL  Lactic acid, plasma     Status: None   Collection Time: 05/06/19  8:36 PM  Result Value Ref Range   Lactic Acid, Venous 1.4 0.5 - 1.9 mmol/L    Comment: Performed at [facility], [address], [place], [place] [address]  CBG monitoring, ED     Status: Abnormal   Collection Time: 05/06/19  8:46 PM  Result Value Ref Range   Glucose-Capillary 231 (H) 70 - 99 mg/dL   Comment 1 Notify RN    Comment 2 Document in Chart   Basic metabolic panel     Status: Abnormal   Collection Time: 05/06/19  8:50 PM  Result Value Ref Range   Sodium 145 135 - 145 mmol/L   Potassium 3.3 (L) 3.5 - 5.1 mmol/L   Chloride 115 (H) 98 - 111 mmol/L   CO2 18 (L) 22 - 32 mmol/L   Glucose, Bld 254 (H) 70 - 99 mg/dL   BUN 14 6 - 20 mg/dL   Creatinine, Ser 0.69 0.44 - 1.00 mg/dL   Calcium 7.7 (L) 8.9 - 10.3 mg/dL   GFR calc non Af Amer >60 >60 mL/min   GFR calc Af Amer >60 >60 mL/min   Anion gap 12 5 - 15    Comment: Performed at [place] [address], [place], [place] [address]  CBG monitoring, ED     Status: Abnormal   Collection Time: 05/06/19 10:06 PM  Result Value Ref Range   Glucose-Capillary 220 (H) 70 - 99 mg/dL  CBG monitoring, ED     Status:  Abnormal   Collection Time: 05/06/19 11:14 PM  Result Value Ref Range   Glucose-Capillary 180 (H) 70 - 99 mg/dL  Lactic acid, plasma     Status: None   Collection Time: 05/07/19 12:08 AM  Result Value Ref Range   Lactic Acid, Venous 1.6 0.5 - 1.9 mmol/L    Comment: Performed at [facility], [address] [place], [place] [address]  CBG monitoring, ED     Status: Abnormal   Collection Time: 05/07/19 12:17  AM  Result Value Ref Range   Glucose-Capillary 162 (H) 70 - 99 mg/dL  CBG monitoring, ED     Status: Abnormal   Collection Time: 05/07/19  1:18 AM  Result Value Ref Range   Glucose-Capillary 140 (H) 70 - 99 mg/dL  CBG monitoring, ED     Status: Abnormal   Collection Time: 05/07/19  2:29 AM  Result Value Ref Range   Glucose-Capillary 154 (H) 70 - 99 mg/dL  CBC     Status: None   Collection Time: 05/07/19  3:39 AM  Result Value Ref Range   WBC 10.5 4.0 - 10.5 K/uL   RBC 3.98 3.87 - 5.11 MIL/uL   Hemoglobin 12.5 12.0 - 15.0 g/dL   HCT 60.436.4 54.036.0 - 98.146.0 %   MCV 91.5 80.0 - 100.0 fL   MCH 31.4 26.0 - 34.0 pg   MCHC 34.3 30.0 - 36.0 g/dL   RDW 19.113.0 47.811.5 - 29.515.5 %   Platelets 242 150 - 400 K/uL   nRBC 0.0 0.0 - 0.2 %    Comment: Performed at [facility], [address] [place], [place] [address]  Comprehensive metabolic panel     Status: Abnormal   Collection Time: 05/07/19  3:39 AM  Result Value Ref Range   Sodium 147 (H) 135 - 145 mmol/L   Potassium 3.1 (L) 3.5 - 5.1 mmol/L   Chloride 119 (H) 98 - 111 mmol/L   CO2 19 (L) 22 - 32 mmol/L   Glucose, Bld 156 (H) 70 - 99 mg/dL   BUN 11 6 - 20 mg/dL   Creatinine, Ser 8.650.57 0.44 - 1.00 mg/dL   Calcium 7.6 (L) 8.9 - 10.3 mg/dL   Total Protein 5.5 (L) 6.5 - 8.1 g/dL   Albumin 3.0 (L) 3.5 - 5.0 g/dL   AST 18 15 - 41 U/L   ALT 27 0 - 44 U/L   Alkaline Phosphatase 85 38 - 126 U/L   Total Bilirubin 0.9 0.3 - 1.2 mg/dL   GFR calc non Af Amer >60 >60 mL/min   GFR calc Af Amer >60 >60 mL/min   Anion gap 9 5 - 15    Comment: Performed at [facility], [address] [place], [place] [address]  CBG monitoring, ED     Status: Abnormal   Collection Time: 05/07/19  3:54 AM  Result Value Ref Range   Glucose-Capillary 147 (H) 70 - 99 mg/dL  CBG monitoring, ED     Status: Abnormal   Collection Time: 05/07/19  5:42 AM  Result Value Ref Range   Glucose-Capillary 178 (H) 70 - 99 mg/dL   Comment 1 Notify RN   CBG monitoring, ED      Status: Abnormal   Collection Time: 05/07/19  6:37 AM  Result Value Ref Range   Glucose-Capillary 176 (H) 70 - 99 mg/dL   Comment 1 Notify RN    No results found.  . sodium chloride Stopped (05/07/19 0423)  .  cefTRIAXone (ROCEPHIN)  IV    . dextrose 5 % and 0.45% NaCl 125 mL/hr at 05/06/19 2100  . insulin 4.4 Units/hr (05/07/19 0400)  . metronidazole 500 mg (05/07/19 0703)    Assessment/Plan DKA -new diabetic diagnosis   Perforated descending colon diverticulitis  FEN: IV fluids/clear liquids  >> going to sips and chips for now. ID: Rocephin/Flagyl 10/8 >> day 2 DVT: SCDs added; she can have prophylactic anticoagulation from our standpoint Follow-up: TBD  Plan: Agree with current medical management.  IV fluids, IV antibiotics, bowel rest, fluid resuscitation and control of her glucose.  I am going to put her on sips and chips until we get a CT, report or the CT itself.  Currently she is pretty comfortable and in no acute distress.   [facility] Surgery Pager:  [phone]    05/07/2019 7:52 AM

## 2019-05-07 NOTE — ED Provider Notes (Signed)
[facility] PROGRESSIVE CARE Provider Note   CSN: [id number] Arrival date & time: 05/06/19  1116     History   Chief Complaint Chief Complaint  Patient presents with  . Abdominal Pain  . Sent by PCP    HPI Christie Martinez is a 48 y.o. female.     HPI  48 year old female comes in a chief complaint of abdominal pain and elevated blood sugar.  Translation service was utilized.  She reports that she has been having abdominal pain for the last 1 month.  Pain is mostly in the lower quadrants and is worse with movement.  He does not have any abdominal pain at rest.  She informed me that she saw her PCP yesterday and blood work was completed and she was asked to get a CT scan done.  She received a call today from the clinic advising her to come to the ER because of elevated blood sugar and some intestinal inflammation/infection.  She denies any new fevers, chills.  She denies any UTI-like symptoms or recent illnesses.  Review of system is positive for feeling weak and having increased thirst and increased urination.  She is taking Bactrim and Flagyl.  History reviewed. No pertinent past medical history.  Patient Active Problem List   Diagnosis Date Noted  . Sepsis ([place]) 05/06/2019  . Diverticulitis of colon with perforation 05/06/2019  . DKA, type 2 ([place]) 05/06/2019  . AKI (acute kidney injury) ([place]) 05/06/2019    History reviewed. No pertinent surgical history.   OB History   No obstetric history on file.      Home Medications    Prior to Admission medications   Medication Sig Start Date End Date Taking? Authorizing Provider  metroNIDAZOLE (FLAGYL) 500 MG tablet Take 1 tablet (500 mg total) by mouth 2 (two) times daily. Patient taking differently: Take 500 mg by mouth 3 (three) times daily. Pt started 05-04-19 for 7 day therapy. 11/27/16  Yes Shary Decamp, PA-C  ondansetron (ZOFRAN) 4 MG tablet Take 4 mg by mouth every 8 (eight) hours as needed for nausea or vomiting.    Yes [provider]  sulfamethoxazole-trimethoprim (BACTRIM DS) 800-160 MG tablet Take 1 tablet by mouth 2 (two) times daily. Pt started on 05-04-19 for 7 day therapy.   Yes [provider]  ibuprofen (ADVIL,MOTRIN) 600 MG tablet Take 1 tablet (600 mg total) by mouth every 6 (six) hours as needed. Patient not taking: Reported on 05/06/2019 11/27/16   Shary Decamp, PA-C  ondansetron (ZOFRAN ODT) 4 MG disintegrating tablet Take 1 tablet (4 mg total) by mouth every 8 (eight) hours as needed for nausea or vomiting. Patient not taking: Reported on 05/06/2019 11/27/16   Shary Decamp, PA-C    Family History Family History  Problem Relation Age of Onset  . Diabetes Father     Social History Social History   Tobacco Use  . Smoking status: Never Smoker  . Smokeless tobacco: Never Used  Substance Use Topics  . Alcohol use: No  . Drug use: Never     Allergies   Patient has no known allergies.   Review of Systems Review of Systems  Constitutional: Positive for activity change. Negative for chills and fever.  Gastrointestinal: Positive for abdominal pain and nausea.  Genitourinary: Positive for frequency.  Allergic/Immunologic: Negative for immunocompromised state.  Hematological: Does not bruise/bleed easily.  All other systems reviewed and are negative.    Physical Exam Updated Vital Signs BP 128/82 (BP Location: Left Arm)  Pulse 85   Temp 98.2 F (36.8 C)   Resp 19   Ht  (1.499 m)   Wt 64.9 kg   SpO2 100%   BMI 28.88 kg/m   Physical Exam Vitals signs and nursing note reviewed.  Constitutional:      Appearance: She is well-developed.  HENT:     Head: Normocephalic and atraumatic.  Eyes:     Pupils: Pupils are equal, round, and reactive to light.  Neck:     Musculoskeletal: Neck supple.  Cardiovascular:     Rate and Rhythm: Normal rate and regular rhythm.     Heart sounds: Normal heart sounds.  Pulmonary:     Effort: Pulmonary effort is normal.  No respiratory distress.  Abdominal:     Palpations: Abdomen is soft.     Tenderness: There is abdominal tenderness in the suprapubic area and left lower quadrant. There is guarding. There is no rebound.  Skin:    General: Skin is warm and dry.  Neurological:     Mental Status: She is alert and oriented to person, place, and time.      ED Treatments / Results  Labs (all labs ordered are listed, but only abnormal results are displayed) Labs Reviewed  COMPREHENSIVE METABOLIC PANEL - Abnormal; Notable for the following components:      Result Value   Sodium 134 (*)    CO2 11 (*)    Glucose, Bld 941 (*)    BUN 24 (*)    Creatinine, Ser 1.50 (*)    Alkaline Phosphatase 141 (*)    Total Bilirubin 1.8 (*)    GFR calc non Af Amer 41 (*)    GFR calc Af Amer 48 (*)    Anion gap 25 (*)    All other components within normal limits  CBC - Abnormal; Notable for the following components:   WBC 10.8 (*)    RBC 5.34 (*)    Hemoglobin 16.5 (*)    HCT 49.1 (*)    All other components within normal limits  URINALYSIS, ROUTINE W REFLEX MICROSCOPIC - Abnormal; Notable for the following components:   Color, Urine STRAW (*)    Glucose, UA >=500 (*)    Ketones, ur 80 (*)    Bacteria, UA RARE (*)    All other components within normal limits  LACTIC ACID, PLASMA - Abnormal; Notable for the following components:   Lactic Acid, Venous 4.2 (*)    All other components within normal limits  LACTIC ACID, PLASMA - Abnormal; Notable for the following components:   Lactic Acid, Venous 2.8 (*)    All other components within normal limits  APTT - Abnormal; Notable for the following components:   aPTT 23 (*)    All other components within normal limits  BASIC METABOLIC PANEL - Abnormal; Notable for the following components:   Potassium 3.3 (*)    Chloride 115 (*)    CO2 18 (*)    Glucose, Bld 254 (*)    Calcium 7.7 (*)    All other components within normal limits  HEMOGLOBIN A1C - Abnormal; Notable  for the following components:   Hgb A1c MFr Bld 12.4 (*)    All other components within normal limits  COMPREHENSIVE METABOLIC PANEL - Abnormal; Notable for the following components:   Sodium 147 (*)    Potassium 3.1 (*)    Chloride 119 (*)    CO2 19 (*)    Glucose, Bld 156 (*)  Calcium 7.6 (*)    Total Protein 5.5 (*)    Albumin 3.0 (*)    All other components within normal limits  GLUCOSE, CAPILLARY - Abnormal; Notable for the following components:   Glucose-Capillary 224 (*)    All other components within normal limits  GLUCOSE, CAPILLARY - Abnormal; Notable for the following components:   Glucose-Capillary 105 (*)    All other components within normal limits  GLUCOSE, CAPILLARY - Abnormal; Notable for the following components:   Glucose-Capillary 120 (*)    All other components within normal limits  GLUCOSE, CAPILLARY - Abnormal; Notable for the following components:   Glucose-Capillary 135 (*)    All other components within normal limits  POCT I-STAT EG7 - Abnormal; Notable for the following components:   pCO2, Ven 27.0 (*)    pO2, Ven 190.0 (*)    Bicarbonate 13.8 (*)    TCO2 15 (*)    Acid-base deficit 10.0 (*)    Calcium, Ion 1.10 (*)    HCT 49.0 (*)    Hemoglobin 16.7 (*)    All other components within normal limits  CBG MONITORING, ED - Abnormal; Notable for the following components:   Glucose-Capillary >600 (*)    All other components within normal limits  CBG MONITORING, ED - Abnormal; Notable for the following components:   Glucose-Capillary 448 (*)    All other components within normal limits  CBG MONITORING, ED - Abnormal; Notable for the following components:   Glucose-Capillary 411 (*)    All other components within normal limits  CBG MONITORING, ED - Abnormal; Notable for the following components:   Glucose-Capillary 330 (*)    All other components within normal limits  CBG MONITORING, ED - Abnormal; Notable for the following components:    Glucose-Capillary 371 (*)    All other components within normal limits  CBG MONITORING, ED - Abnormal; Notable for the following components:   Glucose-Capillary 281 (*)    All other components within normal limits  CBG MONITORING, ED - Abnormal; Notable for the following components:   Glucose-Capillary 231 (*)    All other components within normal limits  CBG MONITORING, ED - Abnormal; Notable for the following components:   Glucose-Capillary 220 (*)    All other components within normal limits  CBG MONITORING, ED - Abnormal; Notable for the following components:   Glucose-Capillary 180 (*)    All other components within normal limits  CBG MONITORING, ED - Abnormal; Notable for the following components:   Glucose-Capillary 162 (*)    All other components within normal limits  CBG MONITORING, ED - Abnormal; Notable for the following components:   Glucose-Capillary 140 (*)    All other components within normal limits  CBG MONITORING, ED - Abnormal; Notable for the following components:   Glucose-Capillary 154 (*)    All other components within normal limits  CBG MONITORING, ED - Abnormal; Notable for the following components:   Glucose-Capillary 147 (*)    All other components within normal limits  CBG MONITORING, ED - Abnormal; Notable for the following components:   Glucose-Capillary 178 (*)    All other components within normal limits  CBG MONITORING, ED - Abnormal; Notable for the following components:   Glucose-Capillary 176 (*)    All other components within normal limits  SARS CORONAVIRUS 2 (TAT 6-24 HRS)  CULTURE, BLOOD (ROUTINE X 2)  CULTURE, BLOOD (ROUTINE X 2)  LIPASE, BLOOD  TSH  HIV ANTIBODY (ROUTINE TESTING W REFLEX)  PROTIME-INR  CBC  LACTIC ACID, PLASMA  LACTIC ACID, PLASMA  HIV4GL SAVE TUBE  I-STAT BETA HCG BLOOD, ED ([facility], [facility], AP ONLY)  I-STAT VENOUS BLOOD GAS, ED    EKG EKG Interpretation  Date/Time:  Thursday May 06 2019 11:34:22 EDT Ventricular  Rate:  146 PR Interval:  122 QRS Duration: 66 QT Interval:  272 QTC Calculation: 423 R Axis:   107 Text Interpretation:  Sinus tachycardia Lateral infarct , age undetermined Abnormal ECG Nonspecific ST and T wave abnormality No old tracing to compare Confirmed by Derwood Kaplan [phone]) on 05/06/2019 12:26:35 PM   Radiology No results found.  Procedures .Critical Care Performed by: Derwood Kaplan, MD Authorized by: Derwood Kaplan, MD   Critical care provider statement:    Critical care time (minutes):  42   Critical care was necessary to treat or prevent imminent or life-threatening deterioration of the following conditions:  Metabolic crisis   Critical care was time spent personally by me on the following activities:  Discussions with consultants, evaluation of patient's response to treatment, examination of patient, ordering and performing treatments and interventions, ordering and review of laboratory studies, ordering and review of radiographic studies, pulse oximetry, re-evaluation of patient's condition, obtaining history from patient or surrogate and review of old charts   (including critical care time)  Medications Ordered in ED Medications  dextrose 5 %-0.45 % sodium chloride infusion ( Intravenous Rate/Dose Change 05/07/19 1121)  insulin regular, human (MYXREDLIN) 100 units/ 100 mL infusion (2.1 Units/hr Intravenous Rate/Dose Change 05/07/19 1124)  dextrose 50 % solution 25 mL (has no administration in time range)  enoxaparin (LOVENOX) injection 40 mg (40 mg Subcutaneous Given 05/07/19 1025)  sodium chloride flush (NS) 0.9 % injection 3 mL (3 mLs Intravenous Not Given 05/07/19 1122)  acetaminophen (TYLENOL) tablet 650 mg (has no administration in time range)    Or  acetaminophen (TYLENOL) suppository 650 mg (has no administration in time range)  ondansetron (ZOFRAN) tablet 4 mg ( Oral See Alternative 05/06/19 2017)    Or  ondansetron (ZOFRAN) injection 4 mg (4 mg Intravenous  Given 05/06/19 2017)  ipratropium-albuterol (DUONEB) 0.5-2.5 (3) MG/3ML nebulizer solution 3 mL (has no administration in time range)  cefTRIAXone (ROCEPHIN) 2 g in sodium chloride 0.9 % 100 mL IVPB (2 g Intravenous New Bag/Given 05/07/19 1127)  metroNIDAZOLE (FLAGYL) IVPB 500 mg (0 mg Intravenous Stopped 05/07/19 0839)  morphine 2 MG/ML injection 2 mg (has no administration in time range)  insulin aspart (novoLOG) injection 0-5 Units (has no administration in time range)  insulin aspart (novoLOG) injection 0-9 Units (0 Units Subcutaneous Not Given 05/07/19 1030)  insulin detemir (LEVEMIR) injection 15 Units (15 Units Subcutaneous Given 05/07/19 1030)  potassium chloride SA (KLOR-CON) CR tablet 40 mEq (40 mEq Oral Given 05/07/19 0833)  influenza vac split quadrivalent PF (FLUARIX) injection 0.5 mL (has no administration in time range)  pneumococcal 23 valent vaccine (PNU-IMMUNE) injection 0.5 mL (has no administration in time range)  lactated ringers bolus 2,000 mL (0 mLs Intravenous Stopped 05/06/19 1444)  cefTRIAXone (ROCEPHIN) 2 g in sodium chloride 0.9 % 100 mL IVPB (0 g Intravenous Stopped 05/06/19 1638)    And  metroNIDAZOLE (FLAGYL) IVPB 500 mg (0 mg Intravenous Stopped 05/06/19 1446)  sodium chloride 0.9 % bolus 1,000 mL (0 mLs Intravenous Stopped 05/06/19 1846)  sodium chloride 0.9 % bolus 1,000 mL (0 mLs Intravenous Stopped 05/06/19 2029)  sodium chloride 0.9 % bolus 500 mL (0 mLs Intravenous Stopped 05/07/19 0300)  Initial Impression / Assessment and Plan / ED Course  I have reviewed the triage vital signs and the nursing notes.  Pertinent labs & imaging results that were available during my care of the patient were reviewed by me and considered in my medical decision making (see chart for details).        48 year old female comes in a chief complaint of elevated blood sugar and abnormal CT scan.  CT scan was completed at outside institution and we have requested records.  On exam she  has focal left lower quadrant tenderness with guarding.  There is no rebound or peritoneal findings.  She is not appearing septic or toxic.  I suspect she has diverticulitis based on the antibiotics that were started.  She is also noted to have elevated blood sugar.  She has DKA with blood sugar over 900.  We will hydrate her in the ED and start insulin drip.  She will need admission.  Final Clinical Impressions(s) / ED Diagnoses   Final diagnoses:  Diabetic ketoacidosis without coma associated with type 2 diabetes mellitus [facility])    ED Discharge Orders    None       Derwood Kaplan, MD 05/07/19 1146

## 2019-05-08 LAB — GLUCOSE, CAPILLARY
Glucose-Capillary: 146 mg/dL — ABNORMAL HIGH (ref 70–99)
Glucose-Capillary: 145 mg/dL — ABNORMAL HIGH (ref 70–99)
Glucose-Capillary: 126 mg/dL — ABNORMAL HIGH (ref 70–99)
Glucose-Capillary: 191 mg/dL — ABNORMAL HIGH (ref 70–99)

## 2019-05-08 LAB — CBC WITH DIFFERENTIAL/PLATELET
Abs Immature Granulocytes: 0.02 10*3/uL (ref 0.00–0.07)
Basophils Absolute: 0 10*3/uL (ref 0.0–0.1)
Basophils Relative: 1 %
Eosinophils Absolute: 0 10*3/uL (ref 0.0–0.5)
Eosinophils Relative: 0 %
HCT: 36.6 % (ref 36.0–46.0)
Hemoglobin: 12.2 g/dL (ref 12.0–15.0)
Immature Granulocytes: 0 %
Lymphocytes Relative: 37 %
Lymphs Abs: 2.1 10*3/uL (ref 0.7–4.0)
MCH: 30.9 pg (ref 26.0–34.0)
MCHC: 33.3 g/dL (ref 30.0–36.0)
MCV: 92.7 fL (ref 80.0–100.0)
Monocytes Absolute: 0.4 10*3/uL (ref 0.1–1.0)
Monocytes Relative: 7 %
Neutro Abs: 3.2 10*3/uL (ref 1.7–7.7)
Neutrophils Relative %: 55 %
Platelets: 193 10*3/uL (ref 150–400)
RBC: 3.95 MIL/uL (ref 3.87–5.11)
RDW: 13.3 % (ref 11.5–15.5)
WBC: 5.7 10*3/uL (ref 4.0–10.5)
nRBC: 0 % (ref 0.0–0.2)

## 2019-05-08 LAB — C-REACTIVE PROTEIN: CRP: <0.8 mg/dL (ref <1.0)

## 2019-05-08 LAB — BASIC METABOLIC PANEL
Anion gap: 9 (ref 5–15)
BUN: 5 mg/dL — ABNORMAL LOW (ref 6–20)
CO2: 24 mmol/L (ref 22–32)
Calcium: 7.8 mg/dL — ABNORMAL LOW (ref 8.9–10.3)
Chloride: 109 mmol/L (ref 98–111)
Creatinine, Ser: 0.48 mg/dL (ref 0.44–1.00)
GFR calc Af Amer: >60 mL/min (ref >60)
GFR calc non Af Amer: >60 mL/min (ref >60)
Glucose, Bld: 162 mg/dL — ABNORMAL HIGH (ref 70–99)
Potassium: 3.4 mmol/L — ABNORMAL LOW (ref 3.5–5.1)
Sodium: 142 mmol/L (ref 135–145)

## 2019-05-08 LAB — CBC
HCT: 36.4 % (ref 36.0–46.0)
Hemoglobin: 12.5 g/dL (ref 12.0–15.0)
MCH: 31.4 pg (ref 26.0–34.0)
MCHC: 34.3 g/dL (ref 30.0–36.0)
MCV: 91.5 fL (ref 80.0–100.0)
Platelets: 242 10*3/uL (ref 150–400)
RBC: 3.98 MIL/uL (ref 3.87–5.11)
RDW: 13 % (ref 11.5–15.5)
WBC: 10.5 10*3/uL (ref 4.0–10.5)
nRBC: 0 % (ref 0.0–0.2)

## 2019-05-08 LAB — MAGNESIUM: Magnesium: 1.8 mg/dL (ref 1.7–2.4)

## 2019-05-08 MED ORDER — POTASSIUM CHLORIDE CRYS ER 20 MEQ PO TBCR
40.0000 meq | EXTENDED_RELEASE_TABLET | Freq: Once | ORAL | Status: AC
  Administered 2019-05-08: 09:00:00 40 meq via ORAL
  Filled 2019-05-08: qty 2

## 2019-05-08 NOTE — Progress Notes (Signed)
Patient ID: Christie Martinez, female   DOB: 09/15/1970, 48 y.o.   MRN: [id number]  PROGRESS NOTE    Christie Martinez  WUJ:[id number] DOB: 08/11/1970 DOA: 05/06/2019 PCP: Patient, No Pcp Per   Brief Narrative:  48 year old female with no past medical history presented with abnormal blood sugar along with left lower quadrant abdominal pain and outpatient CT of the abdomen done by PCP showing acute diverticulitis with localized perforation at the level of distal descending colon.  In the ED, her glucose was 941 with elevated anion gap.  She was started on intravenous fluids, intravenous insulin and antibiotics.  Assessment & Plan:   Sepsis: Present on admission Acute diverticulitis with localized perforation at the level of distal descending colon -Patient was on Bactrim and Flagyl as an outpatient. -CT of the abdomen done by PCP showing acute diverticulitis with localized perforation at the level of distal descending colon. -Currently on Rocephin and Flagyl. -Follow cultures.  Pain management.  Continue IV fluids. -General surgery following.  Currently n.p.o.  Diet advancement as per general surgery.  DKA/new diagnosis of uncontrolled diabetes mellitus type 2 -Presented with glucose of 941 with elevated anion gap. -Started on insulin drip.  Switched to long-acting insulin after anion gap closed.  -hemoglobin A1c 12.4.  Diabetes coordinator following -Blood sugars improving.  Continue Levemir along with CBGs with SSI.  Acute kidney injury -Resolved with IV fluids.  Hypokalemia -Replace.  Repeat a.m. labs  DVT prophylaxis: Lovenox Code Status: Full Family Communication: None at bedside Disposition Plan: Home in 1-3 days once cleared by general surgery.  Consultants: General surgery  Procedures: None Antimicrobials: Rocephin and Flagyl from 05/06/2019 onwards   Subjective: Patient seen and examined at bedside.  Denies any current nausea or vomiting or worsening abdominal pain.  No  overnight fever reported.  Objective: Vitals:   05/07/19 2119 05/08/19 0410 05/08/19 0734 05/08/19 0852  BP: 115/79 116/75 114/78 117/75  Pulse: 81 86 85   Resp: 19 19 18    Temp: 98.4 F (36.9 C) (!) 97.5 F (36.4 C) 98.8 F (37.1 C)   TempSrc: Oral Oral Oral   SpO2: 99% 100% 97%   Weight:  68 kg    Height:        Intake/Output Summary (Last 24 hours) at 05/08/2019 1032 Last data filed at 05/08/2019 0412 Gross per 24 hour  Intake 1999.39 ml  Output 1700 ml  Net 299.39 ml   Filed Weights   05/06/19 1128 05/08/19 0410  Weight: 64.9 kg 68 kg    Examination:  General exam: No acute distress  respiratory system: Bilateral decreased breath sounds at bases, no wheezing Cardiovascular system: S1 & S2 heard, Rate controlled Gastrointestinal system: Abdomen is nondistended, soft and very mildly tender in the left lower quadrant.  Bowel sounds are decreased. Extremities: No cyanosis; no edema    Data Reviewed: I have personally reviewed following labs and imaging studies  CBC: Recent Labs  Lab 05/06/19 1142 05/06/19 1319 05/07/19 0339 05/08/19 0356  WBC 10.8*  --  10.5 5.7  NEUTROABS  --   --   --  3.2  HGB 16.5* 16.7* 12.5 12.2  HCT 49.1* 49.0* 36.4 36.6  MCV 91.9  --  91.5 92.7  PLT 373  --  242 193   Basic Metabolic Panel: Recent Labs  Lab 05/06/19 1142 05/06/19 1319 05/06/19 2050 05/07/19 0339 05/08/19 0356  NA 134* 136 145 147* 142  K 5.1 5.0 3.3* 3.1* 3.4*  CL 98  --  115*  119* 109  CO2 11*  --  18* 19* 24  GLUCOSE 941*  --  254* 156* 162*  BUN 24*  --  14 11 5*  CREATININE 1.50*  --  0.69 0.57 0.48  CALCIUM 9.0  --  7.7* 7.6* 7.8*  MG  --   --   --   --  1.8   GFR: Estimated Creatinine Clearance: 73.7 mL/min (by C-G formula based on SCr of 0.48 mg/dL). Liver Function Tests: Recent Labs  Lab 05/06/19 1142 05/07/19 0339  AST 22 18  ALT 44 27  ALKPHOS 141* 85  BILITOT 1.8* 0.9  PROT 8.0 5.5*  ALBUMIN 4.4 3.0*   Recent Labs  Lab 05/06/19  1142  LIPASE 33   No results for input(s): AMMONIA in the last 168 hours. Coagulation Profile: Recent Labs  Lab 05/06/19 1441  INR 1.1   Cardiac Enzymes: No results for input(s): CKTOTAL, CKMB, CKMBINDEX, TROPONINI in the last 168 hours. BNP (last 3 results) No results for input(s): PROBNP in the last 8760 hours. HbA1C: Recent Labs    05/06/19 1142  HGBA1C 12.4*   CBG: Recent Labs  Lab 05/07/19 1215 05/07/19 1339 05/07/19 1633 05/07/19 2118 05/08/19 0737  GLUCAP 144* 129* 173* 161* 146*   Lipid Profile: No results for input(s): CHOL, HDL, LDLCALC, TRIG, CHOLHDL, LDLDIRECT in the last 72 hours. Thyroid Function Tests: Recent Labs    05/06/19 1222  TSH 1.408   Anemia Panel: No results for input(s): VITAMINB12, FOLATE, FERRITIN, TIBC, IRON, RETICCTPCT in the last 72 hours. Sepsis Labs: Recent Labs  Lab 05/06/19 1200 05/06/19 1455 05/06/19 2036 05/07/19 0008  LATICACIDVEN 4.2* 2.8* 1.4 1.6    Recent Results (from the past 240 hour(s))  SARS CORONAVIRUS 2 (TAT 6-24 HRS) Nasopharyngeal Nasopharyngeal Swab     Status: None   Collection Time: 05/06/19  2:42 PM   Specimen: Nasopharyngeal Swab  Result Value Ref Range Status   SARS Coronavirus 2 NEGATIVE NEGATIVE Final    Comment: (NOTE) SARS-CoV-2 target nucleic acids are NOT DETECTED. The SARS-CoV-2 RNA is generally detectable in upper and lower respiratory specimens during the acute phase of infection. Negative results do not preclude SARS-CoV-2 infection, do not rule out co-infections with other pathogens, and should not be used as the sole basis for treatment or other patient management decisions. Negative results must be combined with clinical observations, patient history, and epidemiological information. The expected result is Negative. Fact Sheet for Patients: SugarRoll.be Fact Sheet for Healthcare Providers: https://www.woods-mathews.com/ This test is not  yet approved or cleared by the [place] FDA and  has been authorized for detection and/or diagnosis of SARS-CoV-2 by FDA under an Emergency Use Authorization (EUA). This EUA will remain  in effect (meaning this test can be used) for the duration of the COVID-19 declaration under Section 56 4(b)(1) of the Act, 21 U.S.C. section 360bbb-3(b)(1), unless the authorization is terminated or revoked sooner. Performed at [facility], [place] [address] [place], [place] [address]   Culture, blood (x 2)     Status: None (Preliminary result)   Collection Time: 05/06/19  2:43 PM   Specimen: BLOOD LEFT HAND  Result Value Ref Range Status   Specimen Description BLOOD LEFT HAND  Final   Special Requests   Final    BOTTLES DRAWN AEROBIC ONLY Blood Culture adequate volume   Culture   Final    NO GROWTH < 24 HOURS Performed at [facility], [place],  [place] [address]    Report Status PENDING  Incomplete  Culture, blood (x 2)     Status: None (Preliminary result)   Collection Time: 05/07/19  3:39 AM   Specimen: BLOOD  Result Value Ref Range Status   Specimen Description BLOOD LEFT HAND  Final   Special Requests   Final    BOTTLES DRAWN AEROBIC AND ANAEROBIC Blood Culture adequate volume   Culture   Final    NO GROWTH < 12 HOURS Performed at [facility], [address], [place], [place] [address]    Report Status PENDING  Incomplete         Radiology Studies: No results found.      Scheduled Meds: . enoxaparin (LOVENOX) injection  40 mg Subcutaneous Daily  . influenza vac split quadrivalent PF  0.5 mL Intramuscular Tomorrow-1000  . insulin aspart  0-5 Units Subcutaneous QHS  . insulin aspart  0-9 Units Subcutaneous TID WC  . insulin detemir  15 Units Subcutaneous Daily  . living well with diabetes book- in spanish   Does not apply Once  . pneumococcal 23 valent vaccine  0.5 mL Intramuscular Tomorrow-1000  . sodium chloride flush  3 mL  Intravenous Q12H   Continuous Infusions: . sodium chloride 75 mL/hr at 05/08/19 0934  . cefTRIAXone (ROCEPHIN)  IV 2 g (05/07/19 1127)  . insulin Stopped (05/07/19 1253)  . metronidazole 500 mg (05/08/19 0510)          Glade Lloyd, MD [facility] 05/08/2019, 10:32 AM

## 2019-05-08 NOTE — Plan of Care (Signed)
  Problem: Education: Goal: Knowledge of General Education information will improve Description: Including pain rating scale, medication(s)/side effects and non-pharmacologic comfort measures 05/08/2019 1328 by Zenovia Jarred, RN Outcome: Progressing 05/08/2019 1327 by Zenovia Jarred, RN Outcome: Progressing   Problem: Health Behavior/Discharge Planning: Goal: Ability to manage health-related needs will improve Outcome: Progressing   Problem: Clinical Measurements: Goal: Ability to maintain clinical measurements within normal limits will improve Outcome: Progressing Goal: Will remain free from infection Outcome: Progressing Goal: Diagnostic test results will improve Outcome: Progressing

## 2019-05-08 NOTE — Progress Notes (Signed)
Initial Nutrition Assessment  DOCUMENTATION CODES:   Not applicable(suspect PCM)  INTERVENTION:    Once diet advanced- Boost Breeze po TID, each supplement provides 250 kcal and 9 grams of protein  MVI daily   NUTRITION DIAGNOSIS:   Inadequate oral intake related to decreased appetite as evidenced by per patient/family report.  GOAL:   Patient will meet greater than or equal to 90% of their needs  MONITOR:   Diet advancement, Supplement acceptance, PO intake, Labs, Weight trends, I & O's  REASON FOR ASSESSMENT:   Malnutrition Screening Tool    ASSESSMENT:   Patient without significant PMH. Presents this admission with perforated descending colon diverticulitis and DKA (no prior diagnosis of DM).   RD working remotely.  Per surgery plan for conservative management with bowel rest.   Spoke with pt via translator service. Reports having poor appetite for 3 week PTA due to ongoing abdominal pain. States during this time she attempted to consume small amount of of fruit and soup. Did not attempt supplements. Prior to this she would eat 2-3 meals daily that consisted of good protein sources. Pt with A1C of 12.4%. RD to leave handout in d/c instructions and attempt to educated once pt is feeling better if possible. Pt will likely benefit from outpatient education.   Pt reports she weighed 165 lb three weeks ago and has lost 20 lb unintentionally. Chart weight records limited in history. Suspect malnutrition but unable to diagnosis without NFPE.   I/O: +3,622 ml since admit  UOP: 1,700 ml x 24 hrs   Drips: NS @ 75 ml/hr  Medications: SS novolog, levemir Labs: K 3.4 (L) CBG 129-173 A1C 12.4 (H)   Diet Order:   Diet Order            Diet NPO time specified Except for: Ice Chips, Other (See Comments)  Diet effective now              EDUCATION NEEDS:   Education needs have been addressed  Skin:  Skin Assessment: Reviewed RN Assessment  Last BM:  10/8  Height:    Ht Readings from Last 1 Encounters:  05/06/19 4\' 11"  (1.499 m)    Weight:   Wt Readings from Last 1 Encounters:  05/08/19 68 kg    Ideal Body Weight:  44.3 kg  BMI:  Body mass index is 30.3 kg/m.  Estimated Nutritional Needs:   Kcal:  1650-1850 kcal  Protein:  80-95 grams  Fluid:  >/= 1.6 L/day   Mariana Single RD, LDN Clinical Nutrition Pager # - [phone]

## 2019-05-08 NOTE — Progress Notes (Signed)
Patient ID: Christie Martinez, female   DOB: 01-Feb-1971, 48 y.o.   MRN: [id number] [facility] Surgery Progress Note:   * No surgery found *  Subjective: Mental status is clear;  She is feeling much better Objective: Vital signs in last 24 hours: Temp:  [97.5 F (36.4 C)-98.8 F (37.1 C)] 98.8 F (37.1 C) (10/10 0734) Pulse Rate:  [81-86] 85 (10/10 0734) Resp:  [18-19] 18 (10/10 0734) BP: (114-121)/(75-79) 117/75 (10/10 0852) SpO2:  [97 %-100 %] 97 % (10/10 0734) Weight:  [68 kg] 68 kg (10/10 0410)  Intake/Output from previous day: 10/09 0701 - 10/10 0700 In: 2059.4 [P.O.:60; I.V.:1599.4; IV Piggyback:400] Out: 1700 [Urine:1700] Intake/Output this shift: No intake/output data recorded.  Physical Exam: Work of breathing is normal.  Pain is much better  Lab Results:  Results for orders placed or performed during the hospital encounter of 05/06/19 (from the past 48 hour(s))  Lipase, blood     Status: None   Collection Time: 05/06/19 11:42 AM  Result Value Ref Range   Lipase 33 11 - 51 U/L    Comment: Performed at [facility], [address], [place], [place] [address]  Comprehensive metabolic panel     Status: Abnormal   Collection Time: 05/06/19 11:42 AM  Result Value Ref Range   Sodium 134 (L) 135 - 145 mmol/L   Potassium 5.1 3.5 - 5.1 mmol/L   Chloride 98 98 - 111 mmol/L   CO2 11 (L) 22 - 32 mmol/L   Glucose, Bld 941 (HH) 70 - 99 mg/dL    Comment: CRITICAL RESULT CALLED TO, READ BACK BY AND VERIFIED WITH: BERTRAND,S RN  ON [id number] BY FLEMINGS    BUN 24 (H) 6 - 20 mg/dL   Creatinine, Ser 5.62 (H) 0.44 - 1.00 mg/dL   Calcium 9.0 8.9 - 13.0 mg/dL   Total Protein 8.0 6.5 - 8.1 g/dL   Albumin 4.4 3.5 - 5.0 g/dL   AST 22 15 - 41 U/L   ALT 44 0 - 44 U/L   Alkaline Phosphatase 141 (H) 38 - 126 U/L   Total Bilirubin 1.8 (H) 0.3 - 1.2 mg/dL   GFR calc non Af Amer 41 (L) >60 mL/min   GFR calc Af Amer 48 (L) >60 mL/min   Anion gap 25 (H) 5 - 15    Comment:  Performed at [facility], [address] [place], [place] [address]  CBC     Status: Abnormal   Collection Time: 05/06/19 11:42 AM  Result Value Ref Range   WBC 10.8 (H) 4.0 - 10.5 K/uL   RBC 5.34 (H) 3.87 - 5.11 MIL/uL   Hemoglobin 16.5 (H) 12.0 - 15.0 g/dL   HCT 46.9 (H) 62.9 - 52.8 %   MCV 91.9 80.0 - 100.0 fL   MCH 30.9 26.0 - 34.0 pg   MCHC 33.6 30.0 - 36.0 g/dL   RDW 41.3 24.4 - 01.0 %   Platelets 373 150 - 400 K/uL   nRBC 0.0 0.0 - 0.2 %    Comment: Performed at [facility], [address], [place], [place] [address]  Hemoglobin A1c     Status: Abnormal   Collection Time: 05/06/19 11:42 AM  Result Value Ref Range   Hgb A1c MFr Bld 12.4 (H) 4.8 - 5.6 %    Comment: (NOTE)         Prediabetes: 5.7 - 6.4         Diabetes: >6.4  Glycemic control for adults with diabetes: <7.0    Mean Plasma Glucose 309 mg/dL    Comment: (NOTE) Performed At: [facility] [place], [place] [id number] Rush Farmer MD YQ:[id number]   I-Stat beta hCG blood, ED     Status: None   Collection Time: 05/06/19 11:52 AM  Result Value Ref Range   I-stat hCG, quantitative <5.0 <5 mIU/mL   Comment 3            Comment:   GEST. AGE      CONC.  (mIU/mL)   <=1 WEEK        5 - 50     2 WEEKS       50 - 500     3 WEEKS       100 - 10,000     4 WEEKS     1,000 - 30,000        FEMALE AND NON-PREGNANT FEMALE:     LESS THAN 5 mIU/mL   Lactic acid, plasma     Status: Abnormal   Collection Time: 05/06/19 12:00 PM  Result Value Ref Range   Lactic Acid, Venous 4.2 (HH) 0.5 - 1.9 mmol/L    Comment: CRITICAL RESULT CALLED TO, READ BACK BY AND VERIFIED WITH: L. MEEKS,RN AT 1314 05/06/2019 BY ZBEECH. Performed at [facility], [place] [address], [place], [place] [address]   TSH     Status: None   Collection Time: 05/06/19 12:22 PM  Result Value Ref Range   TSH 1.408 0.350 - 4.500 uIU/mL    Comment: Performed by a 3rd Generation assay with a functional  sensitivity of <=0.01 uIU/mL. Performed at [facility], [place] [address], [place], [place] [address]   Urinalysis, Routine w reflex microscopic     Status: Abnormal   Collection Time: 05/06/19 12:25 PM  Result Value Ref Range   Color, Urine STRAW (A) YELLOW   APPearance CLEAR CLEAR   Specific Gravity, Urine 1.030 1.005 - 1.030   pH 5.0 5.0 - 8.0   Glucose, UA >=500 (A) NEGATIVE mg/dL   Hgb urine dipstick NEGATIVE NEGATIVE   Bilirubin Urine NEGATIVE NEGATIVE   Ketones, ur 80 (A) NEGATIVE mg/dL   Protein, ur NEGATIVE NEGATIVE mg/dL   Nitrite NEGATIVE NEGATIVE   Leukocytes,Ua NEGATIVE NEGATIVE   RBC / HPF 0-5 0 - 5 RBC/hpf   WBC, UA 0-5 0 - 5 WBC/hpf   Bacteria, UA RARE (A) NONE SEEN   Squamous Epithelial / LPF 0-5 0 - 5    Comment: Performed at [facility], [address], [place], [place] [address]  POCT I-Stat EG7     Status: Abnormal   Collection Time: 05/06/19  1:19 PM  Result Value Ref Range   pH, Ven 7.316 7.250 - 7.430   pCO2, Ven 27.0 (L) 44.0 - 60.0 mmHg   pO2, Ven 190.0 (H) 32.0 - 45.0 mmHg   Bicarbonate 13.8 (L) 20.0 - 28.0 mmol/L   TCO2 15 (L) 22 - 32 mmol/L   O2 Saturation 100.0 %   Acid-base deficit 10.0 (H) 0.0 - 2.0 mmol/L   Sodium 136 135 - 145 mmol/L   Potassium 5.0 3.5 - 5.1 mmol/L   Calcium, Ion 1.10 (L) 1.15 - 1.40 mmol/L   HCT 49.0 (H) 36.0 - 46.0 %   Hemoglobin 16.7 (H) 12.0 - 15.0 g/dL   Patient temperature HIDE    Sample type VENOUS   CBG monitoring, ED  Status: Abnormal   Collection Time: 05/06/19  2:02 PM  Result Value Ref Range   Glucose-Capillary >600 (HH) 70 - 99 mg/dL  HIV Antibody (routine testing w rflx)     Status: None   Collection Time: 05/06/19  2:41 PM  Result Value Ref Range   HIV Screen 4th Generation wRfx NON REACTIVE NON REACTIVE    Comment: Performed at [facility], [address], [place], [place] [address]  Protime-INR     Status: None   Collection Time: 05/06/19  2:41 PM  Result Value Ref Range    Prothrombin Time 14.4 11.4 - 15.2 seconds   INR 1.1 0.8 - 1.2    Comment: (NOTE) INR goal varies based on device and disease states. Performed at [facility], [address] [place], [place] [address]   APTT     Status: Abnormal   Collection Time: 05/06/19  2:41 PM  Result Value Ref Range   aPTT 23 (L) 24 - 36 seconds    Comment: Performed at [facility], [address] [place], [place] [address]  SARS CORONAVIRUS 2 (TAT 6-24 HRS) Nasopharyngeal Nasopharyngeal Swab     Status: None   Collection Time: 05/06/19  2:42 PM   Specimen: Nasopharyngeal Swab  Result Value Ref Range   SARS Coronavirus 2 NEGATIVE NEGATIVE    Comment: (NOTE) SARS-CoV-2 target nucleic acids are NOT DETECTED. The SARS-CoV-2 RNA is generally detectable in upper and lower respiratory specimens during the acute phase of infection. Negative results do not preclude SARS-CoV-2 infection, do not rule out co-infections with other pathogens, and should not be used as the sole basis for treatment or other patient management decisions. Negative results must be combined with clinical observations, patient history, and epidemiological information. The expected result is Negative. Fact Sheet for Patients: HairSlick.no Fact Sheet for Healthcare Providers: quierodirigir.com This test is not yet approved or cleared by the [place] FDA and  has been authorized for detection and/or diagnosis of SARS-CoV-2 by FDA under an Emergency Use Authorization (EUA). This EUA will remain  in effect (meaning this test can be used) for the duration of the COVID-19 declaration under Section 56 4(b)(1) of the Act, 21 U.S.C. section 360bbb-3(b)(1), unless the authorization is terminated or revoked sooner. Performed at [facility], [address], [place], [place] [address]   Culture, blood (x 2)     Status: None (Preliminary result)   Collection Time: 05/06/19   2:43 PM   Specimen: BLOOD LEFT HAND  Result Value Ref Range   Specimen Description BLOOD LEFT HAND    Special Requests      BOTTLES DRAWN AEROBIC ONLY Blood Culture adequate volume   Culture      NO GROWTH < 24 HOURS Performed at [facility], [address], [place], [place] [address]    Report Status PENDING   Lactic acid, plasma     Status: Abnormal   Collection Time: 05/06/19  2:55 PM  Result Value Ref Range   Lactic Acid, Venous 2.8 (HH) 0.5 - 1.9 mmol/L    Comment: CRITICAL VALUE NOTED.  VALUE IS CONSISTENT WITH PREVIOUSLY REPORTED AND CALLED VALUE. Performed at [facility], [address], [place], [place] [address]   CBG monitoring, ED     Status: Abnormal   Collection Time: 05/06/19  3:19 PM  Result Value Ref Range   Glucose-Capillary 448 (H) 70 - 99 mg/dL  CBG monitoring, ED  Status: Abnormal   Collection Time: 05/06/19  4:05 PM  Result Value Ref Range   Glucose-Capillary 411 (H) 70 - 99 mg/dL  CBG monitoring, ED     Status: Abnormal   Collection Time: 05/06/19  5:14 PM  Result Value Ref Range   Glucose-Capillary 330 (H) 70 - 99 mg/dL  CBG monitoring, ED     Status: Abnormal   Collection Time: 05/06/19  6:43 PM  Result Value Ref Range   Glucose-Capillary 371 (H) 70 - 99 mg/dL   Comment 1 Document in Chart   CBG monitoring, ED     Status: Abnormal   Collection Time: 05/06/19  7:47 PM  Result Value Ref Range   Glucose-Capillary 281 (H) 70 - 99 mg/dL  Lactic acid, plasma     Status: None   Collection Time: 05/06/19  8:36 PM  Result Value Ref Range   Lactic Acid, Venous 1.4 0.5 - 1.9 mmol/L    Comment: Performed at [facility], [address], [place], [place] [address]  CBG monitoring, ED     Status: Abnormal   Collection Time: 05/06/19  8:46 PM  Result Value Ref Range   Glucose-Capillary 231 (H) 70 - 99 mg/dL   Comment 1 Notify RN    Comment 2 Document in Chart   Basic metabolic panel     Status: Abnormal   Collection Time: 05/06/19   8:50 PM  Result Value Ref Range   Sodium 145 135 - 145 mmol/L   Potassium 3.3 (L) 3.5 - 5.1 mmol/L   Chloride 115 (H) 98 - 111 mmol/L   CO2 18 (L) 22 - 32 mmol/L   Glucose, Bld 254 (H) 70 - 99 mg/dL   BUN 14 6 - 20 mg/dL   Creatinine, Ser 9.14 0.44 - 1.00 mg/dL   Calcium 7.7 (L) 8.9 - 10.3 mg/dL   GFR calc non Af Amer >60 >60 mL/min   GFR calc Af Amer >60 >60 mL/min   Anion gap 12 5 - 15    Comment: Performed at [facility], [address] [place], [place] [address]  CBG monitoring, ED     Status: Abnormal   Collection Time: 05/06/19 10:06 PM  Result Value Ref Range   Glucose-Capillary 220 (H) 70 - 99 mg/dL  CBG monitoring, ED     Status: Abnormal   Collection Time: 05/06/19 11:14 PM  Result Value Ref Range   Glucose-Capillary 180 (H) 70 - 99 mg/dL  Lactic acid, plasma     Status: None   Collection Time: 05/07/19 12:08 AM  Result Value Ref Range   Lactic Acid, Venous 1.6 0.5 - 1.9 mmol/L    Comment: Performed at [facility], [address], [place], [place] [address]  CBG monitoring, ED     Status: Abnormal   Collection Time: 05/07/19 12:17 AM  Result Value Ref Range   Glucose-Capillary 162 (H) 70 - 99 mg/dL  CBG monitoring, ED     Status: Abnormal   Collection Time: 05/07/19  1:18 AM  Result Value Ref Range   Glucose-Capillary 140 (H) 70 - 99 mg/dL  CBG monitoring, ED     Status: Abnormal   Collection Time: 05/07/19  2:29 AM  Result Value Ref Range   Glucose-Capillary 154 (H) 70 - 99 mg/dL  Culture, blood (x 2)     Status: None (Preliminary result)   Collection Time: 05/07/19  3:39 AM   Specimen: BLOOD  Result Value Ref Range  Specimen Description BLOOD LEFT HAND    Special Requests      BOTTLES DRAWN AEROBIC AND ANAEROBIC Blood Culture adequate volume   Culture      NO GROWTH < 12 HOURS Performed at [facility], [address] [place], [place] [address]    Report Status PENDING   CBC     Status: None   Collection Time: 05/07/19  3:39 AM   Result Value Ref Range   WBC 10.5 4.0 - 10.5 K/uL   RBC 3.98 3.87 - 5.11 MIL/uL   Hemoglobin 12.5 12.0 - 15.0 g/dL   HCT 81.1 91.4 - 78.2 %   MCV 91.5 80.0 - 100.0 fL   MCH 31.4 26.0 - 34.0 pg   MCHC 34.3 30.0 - 36.0 g/dL   RDW 95.6 21.3 - 08.6 %   Platelets 242 150 - 400 K/uL   nRBC 0.0 0.0 - 0.2 %    Comment: Performed at [facility], [address] [place], [place] [address]  Comprehensive metabolic panel     Status: Abnormal   Collection Time: 05/07/19  3:39 AM  Result Value Ref Range   Sodium 147 (H) 135 - 145 mmol/L   Potassium 3.1 (L) 3.5 - 5.1 mmol/L   Chloride 119 (H) 98 - 111 mmol/L   CO2 19 (L) 22 - 32 mmol/L   Glucose, Bld 156 (H) 70 - 99 mg/dL   BUN 11 6 - 20 mg/dL   Creatinine, Ser 9.62 0.44 - 1.00 mg/dL   Calcium 7.6 (L) 8.9 - 10.3 mg/dL   Total Protein 5.5 (L) 6.5 - 8.1 g/dL   Albumin 3.0 (L) 3.5 - 5.0 g/dL   AST 18 15 - 41 U/L   ALT 27 0 - 44 U/L   Alkaline Phosphatase 85 38 - 126 U/L   Total Bilirubin 0.9 0.3 - 1.2 mg/dL   GFR calc non Af Amer >60 >60 mL/min   GFR calc Af Amer >60 >60 mL/min   Anion gap 9 5 - 15    Comment: Performed at [facility], [address] [place], [place] [address]  CBG monitoring, ED     Status: Abnormal   Collection Time: 05/07/19  3:54 AM  Result Value Ref Range   Glucose-Capillary 147 (H) 70 - 99 mg/dL  CBG monitoring, ED     Status: Abnormal   Collection Time: 05/07/19  5:42 AM  Result Value Ref Range   Glucose-Capillary 178 (H) 70 - 99 mg/dL   Comment 1 Notify RN   CBG monitoring, ED     Status: Abnormal   Collection Time: 05/07/19  6:37 AM  Result Value Ref Range   Glucose-Capillary 176 (H) 70 - 99 mg/dL   Comment 1 Notify RN   Glucose, capillary     Status: Abnormal   Collection Time: 05/07/19  8:01 AM  Result Value Ref Range   Glucose-Capillary 224 (H) 70 - 99 mg/dL  Glucose, capillary     Status: Abnormal   Collection Time: 05/07/19  9:11 AM  Result Value Ref Range   Glucose-Capillary 105 (H)  70 - 99 mg/dL  Glucose, capillary     Status: Abnormal   Collection Time: 05/07/19 10:09 AM  Result Value Ref Range   Glucose-Capillary 120 (H) 70 - 99 mg/dL  Glucose, capillary     Status: Abnormal   Collection Time: 05/07/19 11:11 AM  Result Value Ref Range   Glucose-Capillary 135 (H) 70 - 99  mg/dL  Glucose, capillary     Status: Abnormal   Collection Time: 05/07/19 12:15 PM  Result Value Ref Range   Glucose-Capillary 144 (H) 70 - 99 mg/dL  Glucose, capillary     Status: Abnormal   Collection Time: 05/07/19  1:39 PM  Result Value Ref Range   Glucose-Capillary 129 (H) 70 - 99 mg/dL  Glucose, capillary     Status: Abnormal   Collection Time: 05/07/19  4:33 PM  Result Value Ref Range   Glucose-Capillary 173 (H) 70 - 99 mg/dL  Glucose, capillary     Status: Abnormal   Collection Time: 05/07/19  9:18 PM  Result Value Ref Range   Glucose-Capillary 161 (H) 70 - 99 mg/dL  Basic metabolic panel     Status: Abnormal   Collection Time: 05/08/19  3:56 AM  Result Value Ref Range   Sodium 142 135 - 145 mmol/L   Potassium 3.4 (L) 3.5 - 5.1 mmol/L   Chloride 109 98 - 111 mmol/L   CO2 24 22 - 32 mmol/L   Glucose, Bld 162 (H) 70 - 99 mg/dL   BUN 5 (L) 6 - 20 mg/dL   Creatinine, Ser 1.61 0.44 - 1.00 mg/dL   Calcium 7.8 (L) 8.9 - 10.3 mg/dL   GFR calc non Af Amer >60 >60 mL/min   GFR calc Af Amer >60 >60 mL/min   Anion gap 9 5 - 15    Comment: Performed at [facility], [address], [place], [place] [address]  CBC with Differential/Platelet     Status: None   Collection Time: 05/08/19  3:56 AM  Result Value Ref Range   WBC 5.7 4.0 - 10.5 K/uL   RBC 3.95 3.87 - 5.11 MIL/uL   Hemoglobin 12.2 12.0 - 15.0 g/dL   HCT 54.0 98.1 - 19.1 %   MCV 92.7 80.0 - 100.0 fL   MCH 30.9 26.0 - 34.0 pg   MCHC 33.3 30.0 - 36.0 g/dL   RDW 47.8 29.5 - 62.1 %   Platelets 193 150 - 400 K/uL   nRBC 0.0 0.0 - 0.2 %   Neutrophils Relative % 55 %   Neutro Abs 3.2 1.7 - 7.7 K/uL   Lymphocytes  Relative 37 %   Lymphs Abs 2.1 0.7 - 4.0 K/uL   Monocytes Relative 7 %   Monocytes Absolute 0.4 0.1 - 1.0 K/uL   Eosinophils Relative 0 %   Eosinophils Absolute 0.0 0.0 - 0.5 K/uL   Basophils Relative 1 %   Basophils Absolute 0.0 0.0 - 0.1 K/uL   Immature Granulocytes 0 %   Abs Immature Granulocytes 0.02 0.00 - 0.07 K/uL    Comment: Performed at [facility], [address], [place], [place] [address]  Magnesium     Status: None   Collection Time: 05/08/19  3:56 AM  Result Value Ref Range   Magnesium 1.8 1.7 - 2.4 mg/dL    Comment: Performed at [facility], [address], [place], [place] [address]  C-reactive protein     Status: None   Collection Time: 05/08/19  3:56 AM  Result Value Ref Range   CRP <0.8 <1.0 mg/dL    Comment: Performed at [facility], [address] [place], [place] [address]  Glucose, capillary     Status: Abnormal   Collection Time: 05/08/19  7:37 AM  Result Value Ref Range   Glucose-Capillary 146 (H) 70 - 99 mg/dL    Radiology/Results:  No results found.  Anti-infectives: Anti-infectives (From admission, onward)   Start     Dose/Rate Route Frequency Ordered Stop   05/07/19 1200  cefTRIAXone (ROCEPHIN) 2 g in sodium chloride 0.9 % 100 mL IVPB     2 g 200 mL/hr over 30 Minutes Intravenous Every 24 hours 05/06/19 1418     05/06/19 2200  metroNIDAZOLE (FLAGYL) IVPB 500 mg     500 mg 100 mL/hr over 60 Minutes Intravenous Every 8 hours 05/06/19 1418     05/06/19 1330  cefTRIAXone (ROCEPHIN) 2 g in sodium chloride 0.9 % 100 mL IVPB     2 g 200 mL/hr over 30 Minutes Intravenous  Once 05/06/19 1329 05/06/19 1638   05/06/19 1330  metroNIDAZOLE (FLAGYL) IVPB 500 mg     500 mg 100 mL/hr over 60 Minutes Intravenous  Once 05/06/19 1329 05/06/19 1446      Assessment/Plan: Problem List: Patient Active Problem List   Diagnosis Date Noted  . Sepsis (HCC) 05/06/2019  . Diverticulitis of colon with perforation 05/06/2019  . 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 AKI (acute kidney injury) (HCC) 05/06/2019    Improvement with IV antibiotics and observation.   * No surgery found *    LOS: 2 days   Matt B. Daphine DeutscherMartin, MD, [facility]aks HospitalFACS  [facility] [phone] beeper [phone]  05/08/2019 9:12 AM

## 2019-05-09 LAB — CBC WITH DIFFERENTIAL/PLATELET
Abs Immature Granulocytes: 0.02 10*3/uL (ref 0.00–0.07)
Basophils Absolute: 0 10*3/uL (ref 0.0–0.1)
Basophils Relative: 0 %
Eosinophils Absolute: 0.1 10*3/uL (ref 0.0–0.5)
Eosinophils Relative: 1 %
HCT: 33.6 % — ABNORMAL LOW (ref 36.0–46.0)
Hemoglobin: 11.5 g/dL — ABNORMAL LOW (ref 12.0–15.0)
Immature Granulocytes: 0 %
Lymphocytes Relative: 49 %
Lymphs Abs: 2.2 10*3/uL (ref 0.7–4.0)
MCH: 30.7 pg (ref 26.0–34.0)
MCHC: 34.2 g/dL (ref 30.0–36.0)
MCV: 89.6 fL (ref 80.0–100.0)
Monocytes Absolute: 0.4 10*3/uL (ref 0.1–1.0)
Monocytes Relative: 8 %
Neutro Abs: 1.9 10*3/uL (ref 1.7–7.7)
Neutrophils Relative %: 42 %
Platelets: 160 10*3/uL (ref 150–400)
RBC: 3.75 MIL/uL — ABNORMAL LOW (ref 3.87–5.11)
RDW: 12.3 % (ref 11.5–15.5)
WBC: 4.6 10*3/uL (ref 4.0–10.5)
nRBC: 0 % (ref 0.0–0.2)

## 2019-05-09 LAB — BASIC METABOLIC PANEL
Anion gap: 10 (ref 5–15)
BUN: <5 mg/dL — ABNORMAL LOW (ref 6–20)
CO2: 26 mmol/L (ref 22–32)
Calcium: 7.6 mg/dL — ABNORMAL LOW (ref 8.9–10.3)
Chloride: 100 mmol/L (ref 98–111)
Creatinine, Ser: 0.43 mg/dL — ABNORMAL LOW (ref 0.44–1.00)
GFR calc Af Amer: >60 mL/min (ref >60)
GFR calc non Af Amer: >60 mL/min (ref >60)
Glucose, Bld: 131 mg/dL — ABNORMAL HIGH (ref 70–99)
Potassium: 2.5 mmol/L — CL (ref 3.5–5.1)
Sodium: 136 mmol/L (ref 135–145)

## 2019-05-09 LAB — MAGNESIUM: Magnesium: 1.6 mg/dL — ABNORMAL LOW (ref 1.7–2.4)

## 2019-05-09 LAB — GLUCOSE, CAPILLARY
Glucose-Capillary: 147 mg/dL — ABNORMAL HIGH (ref 70–99)
Glucose-Capillary: 174 mg/dL — ABNORMAL HIGH (ref 70–99)
Glucose-Capillary: 260 mg/dL — ABNORMAL HIGH (ref 70–99)
Glucose-Capillary: 203 mg/dL — ABNORMAL HIGH (ref 70–99)

## 2019-05-09 MED ORDER — MAGNESIUM CHLORIDE 64 MG PO TBEC
1.0000 | DELAYED_RELEASE_TABLET | Freq: Once | ORAL | Status: AC
  Administered 2019-05-09: 08:00:00 64 mg via ORAL
  Filled 2019-05-09: qty 1

## 2019-05-09 MED ORDER — POTASSIUM CHLORIDE CRYS ER 20 MEQ PO TBCR
40.0000 meq | EXTENDED_RELEASE_TABLET | Freq: Two times a day (BID) | ORAL | Status: AC
  Administered 2019-05-09 (×2): 40 meq via ORAL
  Filled 2019-05-09 (×2): qty 2

## 2019-05-09 MED ORDER — POTASSIUM CHLORIDE 10 MEQ/100ML IV SOLN
10.0000 meq | INTRAVENOUS | Status: AC
  Administered 2019-05-09 (×4): 10 meq via INTRAVENOUS
  Filled 2019-05-09 (×4): qty 100

## 2019-05-09 NOTE — Care Management (Signed)
Requested CMA to schedule follow up appointment at [facility] or other indegent clinic Monday for establishment of PCP. Patient will receive call to notify her of apt date and time. She will be able to utilize [facility] pharrmacy (M-F), and will be provided [facility] letter prior to DC that be used at many different pharmacies if [facility] Sunday  Carles Collet RN BSN CPN Case Management [phone] Please refer to [facility] for CM provider on call.

## 2019-05-09 NOTE — Progress Notes (Signed)
Patient ID: Christie Martinez, female   DOB: [birth year], 48 y.o.   MRN: [id number] [facility] Progress Note:   * No surgery found *  Subjective: Mental status is clear.  Pain is better Objective: Vital signs in last 24 hours: Temp:  [98.2 F (36.8 C)-98.6 F (37 C)] 98.6 F (37 C) (10/11 0746) Pulse Rate:  [75-80] 80 (10/11 0746) Resp:  [16-19] 18 (10/11 0746) BP: (101-107)/(65-72) 102/72 (10/11 0746) SpO2:  [97 %-100 %] 97 % (10/11 0746) Weight:  [67.8 kg] 67.8 kg (10/11 0440)  Intake/Output from previous day: 10/10 0701 - 10/11 0700 In: 3365.9 [P.O.:240; I.V.:2575.8; IV Piggyback:550.1] Out: 1300 [Urine:1300] Intake/Output this shift: No intake/output data recorded.  Physical Exam: Work of breathing is normal.  Minimal abdominal pain  Lab Results:  Results for orders placed or performed during the hospital encounter of 05/06/19 (from the past 48 hour(s))  Glucose, capillary     Status: Abnormal   Collection Time: 05/07/19 11:11 AM  Result Value Ref Range   Glucose-Capillary 135 (H) 70 - 99 mg/dL  Glucose, capillary     Status: Abnormal   Collection Time: 05/07/19 12:15 PM  Result Value Ref Range   Glucose-Capillary 144 (H) 70 - 99 mg/dL  Glucose, capillary     Status: Abnormal   Collection Time: 05/07/19  1:39 PM  Result Value Ref Range   Glucose-Capillary 129 (H) 70 - 99 mg/dL  Glucose, capillary     Status: Abnormal   Collection Time: 05/07/19  4:33 PM  Result Value Ref Range   Glucose-Capillary 173 (H) 70 - 99 mg/dL  Glucose, capillary     Status: Abnormal   Collection Time: 05/07/19  9:18 PM  Result Value Ref Range   Glucose-Capillary 161 (H) 70 - 99 mg/dL  Basic metabolic panel     Status: Abnormal   Collection Time: 05/08/19  3:56 AM  Result Value Ref Range   Sodium 142 135 - 145 mmol/L   Potassium 3.4 (L) 3.5 - 5.1 mmol/L   Chloride 109 98 - 111 mmol/L   CO2 24 22 - 32 mmol/L   Glucose, Bld 162 (H) 70 - 99 mg/dL   BUN 5 (L) 6 - 20 mg/dL   Creatinine, Ser 5.46 0.44 - 1.00 mg/dL   Calcium 7.8 (L) 8.9 - 10.3 mg/dL   GFR calc non Af Amer >60 >60 mL/min   GFR calc Af Amer >60 >60 mL/min   Anion gap 9 5 - 15    Comment: Performed at [facility], [address] [place], [place] [address]  CBC with Differential/Platelet     Status: None   Collection Time: 05/08/19  3:56 AM  Result Value Ref Range   WBC 5.7 4.0 - 10.5 K/uL   RBC 3.95 3.87 - 5.11 MIL/uL   Hemoglobin 12.2 12.0 - 15.0 g/dL   HCT 00.9 38.1 - 82.9 %   MCV 92.7 80.0 - 100.0 fL   MCH 30.9 26.0 - 34.0 pg   MCHC 33.3 30.0 - 36.0 g/dL   RDW 93.7 16.9 - 67.8 %   Platelets 193 150 - 400 K/uL   nRBC 0.0 0.0 - 0.2 %   Neutrophils Relative % 55 %   Neutro Abs 3.2 1.7 - 7.7 K/uL   Lymphocytes Relative 37 %   Lymphs Abs 2.1 0.7 - 4.0 K/uL   Monocytes Relative 7 %   Monocytes Absolute 0.4 0.1 - 1.0 K/uL   Eosinophils Relative 0 %  Eosinophils Absolute 0.0 0.0 - 0.5 K/uL   Basophils Relative 1 %   Basophils Absolute 0.0 0.0 - 0.1 K/uL   Immature Granulocytes 0 %   Abs Immature Granulocytes 0.02 0.00 - 0.07 K/uL    Comment: Performed at [facility], [address] [place], [place] [address]  Magnesium     Status: None   Collection Time: 05/08/19  3:56 AM  Result Value Ref Range   Magnesium 1.8 1.7 - 2.4 mg/dL    Comment: Performed at [facility], [address] [place], [place] [address]  C-reactive protein     Status: None   Collection Time: 05/08/19  3:56 AM  Result Value Ref Range   CRP <0.8 <1.0 mg/dL    Comment: Performed at [facility], [address] [place], [place] [address]  Glucose, capillary     Status: Abnormal   Collection Time: 05/08/19  7:37 AM  Result Value Ref Range   Glucose-Capillary 146 (H) 70 - 99 mg/dL  Glucose, capillary     Status: Abnormal   Collection Time: 05/08/19 11:15 AM  Result Value Ref Range   Glucose-Capillary 145 (H) 70 - 99 mg/dL  Glucose, capillary     Status: Abnormal   Collection Time:  05/08/19  4:40 PM  Result Value Ref Range   Glucose-Capillary 126 (H) 70 - 99 mg/dL  Glucose, capillary     Status: Abnormal   Collection Time: 05/08/19  9:36 PM  Result Value Ref Range   Glucose-Capillary 191 (H) 70 - 99 mg/dL  Basic metabolic panel     Status: Abnormal   Collection Time: 05/09/19  4:15 AM  Result Value Ref Range   Sodium 136 135 - 145 mmol/L   Potassium 2.5 (LL) 3.5 - 5.1 mmol/L    Comment: CRITICAL RESULT CALLED TO, READ BACK BY AND VERIFIED WITH: RODGERS D,RN 05/09/19 0558 WAYK    Chloride 100 98 - 111 mmol/L   CO2 26 22 - 32 mmol/L   Glucose, Bld 131 (H) 70 - 99 mg/dL   BUN <5 (L) 6 - 20 mg/dL   Creatinine, Ser 9.620.43 (L) 0.44 - 1.00 mg/dL   Calcium 7.6 (L) 8.9 - 10.3 mg/dL   GFR calc non Af Amer >60 >60 mL/min   GFR calc Af Amer >60 >60 mL/min   Anion gap 10 5 - 15    Comment: Performed at [facility], [address] [place], [place] [address]  CBC with Differential/Platelet     Status: Abnormal   Collection Time: 05/09/19  4:15 AM  Result Value Ref Range   WBC 4.6 4.0 - 10.5 K/uL   RBC 3.75 (L) 3.87 - 5.11 MIL/uL   Hemoglobin 11.5 (L) 12.0 - 15.0 g/dL   HCT 13.233.6 (L) 44.036.0 - 10.246.0 %   MCV 89.6 80.0 - 100.0 fL   MCH 30.7 26.0 - 34.0 pg   MCHC 34.2 30.0 - 36.0 g/dL   RDW 72.512.3 36.611.5 - 44.015.5 %   Platelets 160 150 - 400 K/uL   nRBC 0.0 0.0 - 0.2 %   Neutrophils Relative % 42 %   Neutro Abs 1.9 1.7 - 7.7 K/uL   Lymphocytes Relative 49 %   Lymphs Abs 2.2 0.7 - 4.0 K/uL   Monocytes Relative 8 %   Monocytes Absolute 0.4 0.1 - 1.0 K/uL   Eosinophils Relative 1 %   Eosinophils Absolute 0.1 0.0 - 0.5 K/uL   Basophils Relative 0 %  Basophils Absolute 0.0 0.0 - 0.1 K/uL   Immature Granulocytes 0 %   Abs Immature Granulocytes 0.02 0.00 - 0.07 K/uL    Comment: Performed at [facility], [address] [place], [place] [address]  Magnesium     Status: Abnormal   Collection Time: 05/09/19  4:15 AM  Result Value Ref Range   Magnesium 1.6 (L) 1.7 -  2.4 mg/dL    Comment: Performed at [place] [address], [place], [place] [address]  Glucose, capillary     Status: Abnormal   Collection Time: 05/09/19  7:34 AM  Result Value Ref Range   Glucose-Capillary 147 (H) 70 - 99 mg/dL    Radiology/Results: No results found.  Anti-infectives: Anti-infectives (From admission, onward)   Start     Dose/Rate Route Frequency Ordered Stop   05/07/19 1200  cefTRIAXone (ROCEPHIN) 2 g in sodium chloride 0.9 % 100 mL IVPB     2 g 200 mL/hr over 30 Minutes Intravenous Every 24 hours 05/06/19 1418     05/06/19 2200  metroNIDAZOLE (FLAGYL) IVPB 500 mg     500 mg 100 mL/hr over 60 Minutes Intravenous Every 8 hours 05/06/19 1418     05/06/19 1330  cefTRIAXone (ROCEPHIN) 2 g in sodium chloride 0.9 % 100 mL IVPB     2 g 200 mL/hr over 30 Minutes Intravenous  Once 05/06/19 1329 05/06/19 1638   05/06/19 1330  metroNIDAZOLE (FLAGYL) IVPB 500 mg     500 mg 100 mL/hr over 60 Minutes Intravenous  Once 05/06/19 1329 05/06/19 1446      Assessment/Plan: Problem List: Patient Active Problem List   Diagnosis Date Noted  . Sepsis ([place]) 05/06/2019  . Diverticulitis of colon with perforation 05/06/2019  . DKA, type 2 ([place]) 05/06/2019  . AKI (acute kidney injury) ([place]) 05/06/2019    Appears much improved.  Would keep diet full liquid to soft for ~ 2 weeks at home while inflammation is resolving.   * No surgery found *    LOS: 3 days   Matt B. Hassell Done, MD, [facility] [phone] beeper [phone]  05/09/2019 10:29 AM

## 2019-05-09 NOTE — Progress Notes (Signed)
Potassium level is 2.5 and magnesium level is 1.6, [facility] paged and orders fr supplements were put into the computer, will give the potassium supplement but the magnesium comes from pharmacy because it's po magnesium not in our [place], no complaints at this time, will continue to monitor.

## 2019-05-09 NOTE — Progress Notes (Signed)
Patient ID: Christie Martinez, female   DOB: 01/04/1971, 48 y.o.   MRN: [id number]  PROGRESS NOTE    Christie Martinez  [id number] DOB: 02/26/1971 DOA: 05/06/2019 PCP: Patient, No Pcp Per   Brief Narrative:  48 year old female with no past medical history presented with abnormal blood sugar along with left lower quadrant abdominal pain and outpatient CT of the abdomen done by PCP showing acute diverticulitis with localized perforation at the level of distal descending colon.  In the ED, her glucose was 941 with elevated anion gap.  She was started on intravenous fluids, intravenous insulin and antibiotics.  Assessment & Plan:   Sepsis: Present on admission Acute diverticulitis with localized perforation at the level of distal descending colon -Patient was on Bactrim and Flagyl as an outpatient. -CT of the abdomen done by PCP showing acute diverticulitis with localized perforation at the level of distal descending colon. -Currently on Rocephin and Flagyl. -Cultures negative so far. -General surgery following.  Diet advancement as per general surgery.  Decrease IV fluids to 50 cc an hour.  DKA/new diagnosis of uncontrolled diabetes mellitus type 2 -Presented with glucose of 941 with elevated anion gap. -Started on insulin drip.  Switched to long-acting insulin after anion gap closed.  -hemoglobin A1c 12.4.  Diabetes coordinator following -Blood sugars improving.  Continue Levemir along with CBGs with SSI.  Acute kidney injury -Resolved with IV fluids.  Hypokalemia -Replace.  Repeat a.m. labs  Hypomagnesemia  -Replace.  Repeat a.m. labs  DVT prophylaxis: Lovenox Code Status: Full Family Communication: None at bedside Disposition Plan: Home in 1-3 days once cleared by general surgery.  Consultants: General surgery  Procedures: None Antimicrobials: Rocephin and Flagyl from 05/06/2019 onwards   Subjective: Patient seen and examined at bedside.  No worsening abdominal pain,  nausea, vomiting or fever.  Objective: Vitals:   05/08/19 1118 05/08/19 2020 05/09/19 0440 05/09/19 0746  BP: 107/72 104/69 101/65 102/72  Pulse: 76 75 78 80  Resp: 19 16 16 18   Temp: 98.4 F (36.9 C) 98.2 F (36.8 C) 98.5 F (36.9 C) 98.6 F (37 C)  TempSrc: Oral Oral Oral Oral  SpO2: 100% 100% 100% 97%  Weight:   67.8 kg   Height:        Intake/Output Summary (Last 24 hours) at 05/09/2019 0820 Last data filed at 05/09/2019 0600 Gross per 24 hour  Intake 3365.89 ml  Output 1300 ml  Net 2065.89 ml   Filed Weights   05/06/19 1128 05/08/19 0410 05/09/19 0440  Weight: 64.9 kg 68 kg 67.8 kg    Examination:  General exam: No distress. respiratory system: Bilateral decreased breath sounds at bases, some scattered crackles Cardiovascular system: Rate controlled, S1-S2 heard Gastrointestinal system: Abdomen is nondistended, soft and very mildly tender in the left lower quadrant.  Bowel sounds are decreased. Extremities: No cyanosis; no edema    Data Reviewed: I have personally reviewed following labs and imaging studies  CBC: Recent Labs  Lab 05/06/19 1142 05/06/19 1319 05/07/19 0339 05/08/19 0356 05/09/19 0415  WBC 10.8*  --  10.5 5.7 4.6  NEUTROABS  --   --   --  3.2 1.9  HGB 16.5* 16.7* 12.5 12.2 11.5*  HCT 49.1* 49.0* 36.4 36.6 33.6*  MCV 91.9  --  91.5 92.7 89.6  PLT 373  --  242 193 160   Basic Metabolic Panel: Recent Labs  Lab 05/06/19 1142 05/06/19 1319 05/06/19 2050 05/07/19 0339 05/08/19 0356 05/09/19 0415  NA 134* 136 145 147*  142 136  K 5.1 5.0 3.3* 3.1* 3.4* 2.5*  CL 98  --  115* 119* 109 100  CO2 11*  --  18* 19* 24 26  GLUCOSE 941*  --  254* 156* 162* 131*  BUN 24*  --  14 11 5* <5*  CREATININE 1.50*  --  0.69 0.57 0.48 0.43*  CALCIUM 9.0  --  7.7* 7.6* 7.8* 7.6*  MG  --   --   --   --  1.8 1.6*   GFR: Estimated Creatinine Clearance: 73.5 mL/min (A) (by C-G formula based on SCr of 0.43 mg/dL (L)). Liver Function Tests: Recent Labs   Lab 05/06/19 1142 05/07/19 0339  AST 22 18  ALT 44 27  ALKPHOS 141* 85  BILITOT 1.8* 0.9  PROT 8.0 5.5*  ALBUMIN 4.4 3.0*   Recent Labs  Lab 05/06/19 1142  LIPASE 33   No results for input(s): AMMONIA in the last 168 hours. Coagulation Profile: Recent Labs  Lab 05/06/19 1441  INR 1.1   Cardiac Enzymes: No results for input(s): CKTOTAL, CKMB, CKMBINDEX, TROPONINI in the last 168 hours. BNP (last 3 results) No results for input(s): PROBNP in the last 8760 hours. HbA1C: Recent Labs    05/06/19 1142  HGBA1C 12.4*   CBG: Recent Labs  Lab 05/08/19 0737 05/08/19 1115 05/08/19 1640 05/08/19 2136 05/09/19 0734  GLUCAP 146* 145* 126* 191* 147*   Lipid Profile: No results for input(s): CHOL, HDL, LDLCALC, TRIG, CHOLHDL, LDLDIRECT in the last 72 hours. Thyroid Function Tests: Recent Labs    05/06/19 1222  TSH 1.408   Anemia Panel: No results for input(s): VITAMINB12, FOLATE, FERRITIN, TIBC, IRON, RETICCTPCT in the last 72 hours. Sepsis Labs: Recent Labs  Lab 05/06/19 1200 05/06/19 1455 05/06/19 2036 05/07/19 0008  LATICACIDVEN 4.2* 2.8* 1.4 1.6    Recent Results (from the past 240 hour(s))  SARS CORONAVIRUS 2 (TAT 6-24 HRS) Nasopharyngeal Nasopharyngeal Swab     Status: None   Collection Time: 05/06/19  2:42 PM   Specimen: Nasopharyngeal Swab  Result Value Ref Range Status   SARS Coronavirus 2 NEGATIVE NEGATIVE Final    Comment: (NOTE) SARS-CoV-2 target nucleic acids are NOT DETECTED. The SARS-CoV-2 RNA is generally detectable in upper and lower respiratory specimens during the acute phase of infection. Negative results do not preclude SARS-CoV-2 infection, do not rule out co-infections with other pathogens, and should not be used as the sole basis for treatment or other patient management decisions. Negative results must be combined with clinical observations, patient history, and epidemiological information. The expected result is Negative. Fact  Sheet for Patients: SugarRoll.be Fact Sheet for Healthcare Providers: https://www.woods-mathews.com/ This test is not yet approved or cleared by the [place] FDA and  has been authorized for detection and/or diagnosis of SARS-CoV-2 by FDA under an Emergency Use Authorization (EUA). This EUA will remain  in effect (meaning this test can be used) for the duration of the COVID-19 declaration under Section 56 4(b)(1) of the Act, 21 U.S.C. section 360bbb-3(b)(1), unless the authorization is terminated or revoked sooner. Performed at [facility], [address], [place], [place] [address]   Culture, blood (x 2)     Status: None (Preliminary result)   Collection Time: 05/06/19  2:43 PM   Specimen: BLOOD LEFT HAND  Result Value Ref Range Status   Specimen Description BLOOD LEFT HAND  Final   Special Requests   Final    BOTTLES DRAWN AEROBIC ONLY Blood Culture adequate  volume   Culture   Final    NO GROWTH 2 DAYS Performed at [facility], [address] [place], [place] [address]    Report Status PENDING  Incomplete  Culture, blood (x 2)     Status: None (Preliminary result)   Collection Time: 05/07/19  3:39 AM   Specimen: BLOOD  Result Value Ref Range Status   Specimen Description BLOOD LEFT HAND  Final   Special Requests   Final    BOTTLES DRAWN AEROBIC AND ANAEROBIC Blood Culture adequate volume   Culture   Final    NO GROWTH 1 DAY Performed at [facility], [address] [place], [place] [address]    Report Status PENDING  Incomplete         Radiology Studies: No results found.      Scheduled Meds: . enoxaparin (LOVENOX) injection  40 mg Subcutaneous Daily  . influenza vac split quadrivalent PF  0.5 mL Intramuscular Tomorrow-1000  . insulin aspart  0-5 Units Subcutaneous QHS  . insulin aspart  0-9 Units Subcutaneous TID WC  . insulin detemir  15 Units Subcutaneous Daily  . living well with  diabetes book- in spanish   Does not apply Once  . pneumococcal 23 valent vaccine  0.5 mL Intramuscular Tomorrow-1000  . potassium chloride  40 mEq Oral BID  . sodium chloride flush  3 mL Intravenous Q12H   Continuous Infusions: . sodium chloride 75 mL/hr at 05/09/19 0600  . cefTRIAXone (ROCEPHIN)  IV Stopped (05/08/19 1230)  . insulin Stopped (05/07/19 1253)  . metronidazole 100 mL/hr at 05/09/19 0600  . potassium chloride 10 mEq (05/09/19 0756)          Glade Lloyd, MD [facility] 05/09/2019, 8:20 AM

## 2019-05-09 NOTE — Plan of Care (Signed)

## 2019-05-10 LAB — BASIC METABOLIC PANEL
Anion gap: 10 (ref 5–15)
BUN: <5 mg/dL — ABNORMAL LOW (ref 6–20)
CO2: 24 mmol/L (ref 22–32)
Calcium: 7.9 mg/dL — ABNORMAL LOW (ref 8.9–10.3)
Chloride: 102 mmol/L (ref 98–111)
Creatinine, Ser: 0.51 mg/dL (ref 0.44–1.00)
GFR calc Af Amer: >60 mL/min (ref >60)
GFR calc non Af Amer: >60 mL/min (ref >60)
Glucose, Bld: 167 mg/dL — ABNORMAL HIGH (ref 70–99)
Potassium: 3.6 mmol/L (ref 3.5–5.1)
Sodium: 136 mmol/L (ref 135–145)

## 2019-05-10 LAB — GLUCOSE, CAPILLARY
Glucose-Capillary: 180 mg/dL — ABNORMAL HIGH (ref 70–99)
Glucose-Capillary: 246 mg/dL — ABNORMAL HIGH (ref 70–99)

## 2019-05-10 LAB — MAGNESIUM: Magnesium: 1.7 mg/dL (ref 1.7–2.4)

## 2019-05-10 MED ORDER — METRONIDAZOLE 500 MG PO TABS: 500.0000 mg | ORAL_TABLET | Freq: Three times a day (TID) | ORAL | 0 refills | Status: AC

## 2019-05-10 MED ORDER — "INSULIN SYRINGE 31G X 5/16"" 0.5 ML MISC": 0 refills | Status: DC

## 2019-05-10 MED ORDER — TRAMADOL HCL 50 MG PO TABS: 50.0000 mg | ORAL_TABLET | Freq: Four times a day (QID) | ORAL | 0 refills | Status: DC | PRN

## 2019-05-10 MED ORDER — CIPROFLOXACIN HCL 500 MG PO TABS: 500.0000 mg | ORAL_TABLET | Freq: Two times a day (BID) | ORAL | 0 refills | Status: DC

## 2019-05-10 MED ORDER — BLOOD GLUCOSE MONITOR KIT: PACK | 0 refills | Status: AC

## 2019-05-10 MED ORDER — CIPROFLOXACIN HCL 500 MG PO TABS: 500.0000 mg | ORAL_TABLET | Freq: Two times a day (BID) | ORAL | 0 refills | Status: AC

## 2019-05-10 MED ORDER — ONDANSETRON HCL 4 MG PO TABS: 4.0000 mg | ORAL_TABLET | Freq: Four times a day (QID) | ORAL | 0 refills | Status: DC | PRN

## 2019-05-10 MED ORDER — BLOOD GLUCOSE MONITOR KIT: PACK | 0 refills | Status: DC

## 2019-05-10 MED ORDER — INSULIN DETEMIR 100 UNIT/ML ~~LOC~~ SOLN: 15.0000 [IU] | Freq: Every day | SUBCUTANEOUS | 0 refills | Status: DC

## 2019-05-10 MED ORDER — METRONIDAZOLE 500 MG PO TABS: 500.0000 mg | ORAL_TABLET | Freq: Three times a day (TID) | ORAL | 0 refills | Status: DC

## 2019-05-10 NOTE — TOC Initial Note (Signed)
Transition of Care [facility]) - Initial/Assessment Note    Patient Details  Name: Christie Martinez MRN: [id number] Date of Birth: 12-11-1970  Transition of Care [facility]) CM/SW Contact:    Bethena Roys, RN Phone Number: 05/10/2019, 10:58 AM  Clinical Narrative: Pt presented for Sepsis- without insurance and PCP-Hospital follow up appointment scheduled and placed on the AVS. MATCH completed for patient and she will have no co pay for medications at this time. Once patient has transitioned into the community pt can utilize the [facility] pharmacy and cost will range from $4.00-$10.00. CM did discuss with Physician and Pharmacy was changed to [facility]- Medications will be delivered to the room prior to transition home. No further needs from the CM at this time.                    Expected Discharge Plan: Home/Self Care Barriers to Discharge: No Barriers Identified   Patient Goals and CMS Choice Patient states their goals for this hospitalization and ongoing recovery are:: "to return home"   Choice offered to / list presented to : NA  Expected Discharge Plan and Services Expected Discharge Plan: Home/Self Care   Discharge Planning Services: CM Consult, Follow-up appt scheduled, [facility], [place], Medication Assistance   Living arrangements for the past 2 months: Single Family Home Expected Discharge Date: 05/10/19                Prior Living Arrangements/Services Living arrangements for the past 2 months: Single Family Home Lives with:: Spouse Patient language and need for interpreter reviewed:: Yes Do you feel safe going back to the place where you live?: Yes      Need for Family Participation in Patient Care: Yes (Comment) Care giver support system in place?: Yes (comment)   Criminal Activity/Legal Involvement Pertinent to Current Situation/Hospitalization: No - Comment as needed  Activities of Daily Living Home Assistive Devices/Equipment: None ADL Screening  (condition at time of admission) Patient's cognitive ability adequate to safely complete daily activities?: Yes Is the patient deaf or have difficulty hearing?: No Does the patient have difficulty seeing, even when wearing glasses/contacts?: No Does the patient have difficulty concentrating, remembering, or making decisions?: No Patient able to express need for assistance with ADLs?: Yes Does the patient have difficulty dressing or bathing?: No Independently performs ADLs?: Yes (appropriate for developmental age) Does the patient have difficulty walking or climbing stairs?: No Weakness of Legs: None Weakness of Arms/Hands: None  Permission Sought/Granted Permission sought to share information with : Family Supports Permission granted to share information with : Yes, Verbal Permission Granted   Emotional Assessment Appearance:: Appears stated age Attitude/Demeanor/Rapport: Gracious   Orientation: : Oriented to Self, Oriented to Place, Oriented to  Time, Oriented to Situation Alcohol / Substance Use: Not Applicable Psych Involvement: No (comment)  Admission diagnosis:  Diabetic ketoacidosis without coma associated with type 2 diabetes mellitus ([place]) [E11.10] Patient Active Problem List   Diagnosis Date Noted  . Sepsis ([place]) 05/06/2019  . Diverticulitis of colon with perforation 05/06/2019  . DKA, type 2 ([place]) 05/06/2019  . AKI (acute kidney injury) ([place]) 05/06/2019   PCP:  Patient, No Pcp Per Pharmacy:   [facility], [place] [address] Phone: [phone] Fax: [phone]  [facility] [place], [place]  [address] Phone: [phone] Fax: [phone]   Readmission Risk Interventions No flowsheet data found.

## 2019-05-10 NOTE — Progress Notes (Signed)
Patient ID: Christie Martinez, female   DOB: 11-Jun-1971, 48 y.o.   MRN: [id number]       Subjective: Denies abdominal pain.  Eating soft diet.  No complaints.  Objective: Vital signs in last 24 hours: Temp:  [98.5 F (36.9 C)-98.8 F (37.1 C)] 98.5 F (36.9 C) (10/12 0616) Pulse Rate:  [76-89] 76 (10/12 0616) BP: (100-104)/(65-67) 104/66 (10/12 0616) SpO2:  [99 %-100 %] 100 % (10/12 0616) Weight:  [67.3 kg] 67.3 kg (10/12 0616) Last BM Date: 05/08/19(per patient)  Intake/Output from previous day: 10/11 0701 - 10/12 0700 In: 670 [P.O.:120; I.V.:350; IV Piggyback:200] Out: -  Intake/Output this shift: No intake/output data recorded.  PE: Heart: regular Lungs: CTAB Abd: soft, +BS, ND, obese, NT  Lab Results:  Recent Labs    05/08/19 0356 05/09/19 0415  WBC 5.7 4.6  HGB 12.2 11.5*  HCT 36.6 33.6*  PLT 193 160   BMET Recent Labs    05/09/19 0415 05/10/19 0719  NA 136 136  K 2.5* 3.6  CL 100 102  CO2 26 24  GLUCOSE 131* 167*  BUN <5* <5*  CREATININE 0.43* 0.51  CALCIUM 7.6* 7.9*   PT/INR No results for input(s): LABPROT, INR in the last 72 hours. CMP     Component Value Date/Time   NA 136 05/10/2019 0719   K 3.6 05/10/2019 0719   CL 102 05/10/2019 0719   CO2 24 05/10/2019 0719   GLUCOSE 167 (H) 05/10/2019 0719   BUN <5 (L) 05/10/2019 0719   CREATININE 0.51 05/10/2019 0719   CALCIUM 7.9 (L) 05/10/2019 0719   PROT 5.5 (L) 05/07/2019 0339   ALBUMIN 3.0 (L) 05/07/2019 0339   AST 18 05/07/2019 0339   ALT 27 05/07/2019 0339   ALKPHOS 85 05/07/2019 0339   BILITOT 0.9 05/07/2019 0339   GFRNONAA >60 05/10/2019 0719   GFRAA >60 05/10/2019 0719   Lipase     Component Value Date/Time   LIPASE 33 05/06/2019 1142       Studies/Results: No results found.  Anti-infectives: Anti-infectives (From admission, onward)   Start     Dose/Rate Route Frequency Ordered Stop   05/07/19 1200  cefTRIAXone (ROCEPHIN) 2 g in sodium chloride 0.9 % 100 mL IVPB     2  g 200 mL/hr over 30 Minutes Intravenous Every 24 hours 05/06/19 1418     05/06/19 2200  metroNIDAZOLE (FLAGYL) IVPB 500 mg     500 mg 100 mL/hr over 60 Minutes Intravenous Every 8 hours 05/06/19 1418     05/06/19 1330  cefTRIAXone (ROCEPHIN) 2 g in sodium chloride 0.9 % 100 mL IVPB     2 g 200 mL/hr over 30 Minutes Intravenous  Once 05/06/19 1329 05/06/19 1638   05/06/19 1330  metroNIDAZOLE (FLAGYL) IVPB 500 mg     500 mg 100 mL/hr over 60 Minutes Intravenous  Once 05/06/19 1329 05/06/19 1446       Assessment/Plan New onset DM  Contained perforated diverticulitis -patient tolerating a soft diet -dietitian consult today to discuss low fiber diet x4 weeks, then high fiber diet to follow for her diverticulitis -we will have her follow up with a colorectal surgeon in the office in 4 weeks for follow up.  This is currently being arranged -she can complete a 7-14 day course of abx therapy and this may be transition to oral from our standpoint. -she is surgically stable for DC home from our standpoint as well.   FEN - soft diet VTE - lovenox  ID - Rocephin/Flagyl   LOS: 4 days    Henreitta Cea , [facility] 05/10/2019, 8:53 AM Pager: [phone]

## 2019-05-10 NOTE — Discharge Instructions (Signed)
Influenza Virus Vaccine (Flucelvax) Qu es este medicamento? La VACUNA ANTIGRIPAL ayuda a disminuir el riesgo de contraer la influenza, tambin conocida como la gripe. La vacuna solo ayuda a protegerle contra algunas cepas de influenza. Este medicamento puede ser utilizado para otros usos; si tiene alguna pregunta consulte con su proveedor de atencin mdica o con su farmacutico. MARCAS COMUNES: FLUCELVAX Qu le debo informar a mi profesional de la salud antes de tomar este medicamento? Necesita saber si usted presenta alguno de los siguientes problemas o situaciones:  trastorno de sangrado como hemofilia  fiebre o infeccin  sndrome de Guillain-Barre u otros problemas neurolgicos  problemas del sistema inmunolgico  infeccin por el virus de la inmunodeficiencia humana (VIH) o SIDA  niveles bajos de plaquetas en la sangre  esclerosis mltiple  una reaccin alrgica o inusual a las vacunas antigripales, a otros medicamentos, alimentos, colorantes o conservantes  si est embarazada o buscando quedar embarazada  si est amamantando a un beb Cmo debo [employer]? Esta vacuna se administra mediante inyeccin por va intramuscular. Lo administra un profesional de KB Home	[place]. Recibir una copia de informacin escrita sobre la vacuna antes de cada vacuna. Asegrese de leer este folleto cada vez cuidadosamente. Este folleto puede cambiar con frecuencia. Hable con su pediatra para informarse acerca del uso de este medicamento en nios. Puede requerir atencin especial. Sobredosis: Pngase en contacto inmediatamente con un centro toxicolgico o una sala de urgencia si usted cree que haya tomado demasiado medicamento. ATENCIN: [employer] es solo para usted. No comparta este medicamento con nadie. Qu sucede si me olvido de una dosis? No se aplica en este caso. Qu puede interactuar con este medicamento?  quimioterapia o radioterapia  medicamentos que suprimen  el sistema inmunolgico, tales como etanercept, anakinra, infliximab y adalimumab  medicamentos que tratan o previenen cogulos sanguneos, como warfarina  fenitona  medicamentos esteroideos, como la prednisona o la cortisona  teofilina  vacunas Puede ser que esta lista no menciona todas las posibles interacciones. Informe a su profesional de KB Home	[place] de [employer] Corporation productos a base de hierbas, medicamentos de [place] o suplementos nutritivos que est tomando. Si usted fuma, consume bebidas alcohlicas o si utiliza drogas ilegales, indqueselo tambin a su profesional de KB Home	[place]. Algunas sustancias pueden interactuar con su medicamento. A qu debo estar atento al usar Coca-Cola? Informe a su mdico o a Barrister's clerk de la [employer] todos los efectos secundarios que persistan despus de 3 das. Llame a su proveedor de atencin mdica si se presentan sntomas inusuales dentro de las 6 semanas de recibir esta vacuna. Es posible que todava pueda contraer la gripe, pero la enfermedad no ser tan fuerte como normalmente. No puede contraer la gripe de esta vacuna. La vacuna antigripal no le protege contra resfros u otras enfermedades que pueden causar [place]. Debe vacunarse cada ao. Qu efectos secundarios puedo tener al Masco Corporation este medicamento? Efectos secundarios que debe informar a su mdico o a Barrister's clerk de la salud tan pronto como sea posible:  Chief of Staff como erupcin cutnea, picazn o urticarias, hinchazn de la cara, labios o lengua Efectos secundarios que, por lo general, no requieren atencin mdica (debe informarlos a su mdico o a su profesional de la salud si persisten o si son molestos):  fiebre  dolor de cabeza  molestias y Forensic psychologist, sensibilidad, enrojecimiento o Estate agent de la inyeccin  cansancio Puede ser que esta lista no menciona todos los posibles efectos secundarios.  Comunquese a su mdico por  asesoramiento mdico [employer]lett[employer]ard. Usted puede informar los efectos secundarios a la FDA por telfono al 1-800-FDA-1088. Dnde debo guardar mi medicina? Esta vacuna se administrar por un profesional de la salud en una [place], Corporate investment banker, consultorio mdico u otro consultorio de un profesional de la salud. No se le suministrar esta vacuna para guardar en su domicilio. ATENCIN: Este folleto es un resumen. Puede ser que no cubra toda la posible informacin. Si usted tiene preguntas acerca de esta medicina, consulte con su mdico, su farmacutico o su profesional de Radiographer, therapeutic.  2020 Elsevier/Gold Standard (2011-07-01 16:29:16)  Contar carbohidratos y la diabetes  Por qu es importante el conteo de carbohidratos?   Contar las porciones de carbohidratos ayuda a Sales executive nivel de glucosa (azcar) en su sangre para que se sienta mejor.   El equilibrio [employer]ger carbohidratos que come y Dietitian determina el nivel de glucosa que tendr en la sangre despus de comer.   Contar carbohidratos tambin le ayudar a planificar sus comidas.   Qu alimentos contienen carbohidratos?  Entre los alimentos con carbohidratos se incluyen:   Panes, galletas saladas y cereales   Pastas, arroz y Holiday representative (verduras) con almidn, como papas, elote (maz o Information systems manager) y Agricultural consultant (guisantes o arvejas)   Armed forces operational officer (habichuelas) y legumbres   [place], [place] de soya y Dentist   Frutas y jugos de fruta   Dulces como pasteles, Gaffer, helados, mermeladas y Nature conservation officer   Porciones de carbohidratos  Al planificar comidas para la diabetes, recuerde que un alimento con 1 porcin de carbohidratos contiene aproximadamente 15 gramos de carbohidratos:   Revise el tamao de las porciones con tazas y cucharas de medir o con una pesa de alimentos.   Lea los Datos de Nutricin en las etiquetas de los alimentos para saber cuntos gramos de carbohidratos contienen los alimentos que come.   Los  Eaton Corporation de este folleto muestran porciones que contienen cerca de 15 gramos de carbohidratos.   Consejos para planificar sus comidas   Un Plan de Alimentacin indica cuntas porciones de carbohidratos consumir en sus comidas y refrigerios (snacks). Para muchos adultos es adecuado comer 3 a 5 porciones de carbohidratos en cada comida y de 1 a 2 porciones de carbohidratos, en cada refrigerio.   En un Plan de Alimentacin diaria saludable, la mayora de los carbohidratos provienen de:  o Al menos 6 porciones de frutas y vegetales sin almidn  o Al menos 6 porciones de Forensic scientist, frijoles y Sports administrator con almidn, con al menos 3 de estas porciones de granos integrales (enteros)  o Al menos 2 porciones de Teaching laboratory technician o productos lcteos   Revise regularmente su nivel de glucosa en la sangre. Esto puede indicarle si necesita ajustar las horas a las que consume carbohidratos.   Comer alimentos que contienen [place], como granos [place], y comer muy pocos alimentos salados es bueno para su salud.   Coma 4 a 6 onzas de carne u otros alimentos con protenas (como hamburguesas de soya) cada da. Elija fuentes de protena bajas en grasa, como carne de res y de cerdo bajas en grasa, pollo, pescado, queso bajo en grasa o alimentos vegetarianos como la soya.   Coma algunas grasas saludables, como aceite de [place], de canola y nueces.   Coma muy pocas grasas saturadas. Estas grasas no son saludables y se Merchandiser, retail, la crema y las carnes con mucha grasa, como el tocino (tocineta)  y las salchichas o chorizos.   Coma muy pocas o nada de grasas trans. Estas grasas no son saludables y se encuentran en todos los alimentos que contienen aceites parcialmente hidrogenados en su lista de ingredientes.   Consejos para leer etiquetas  En los Datos de Nutricin de las etiquetas aparece una lista con el total de gramos de carbohidratos en una porcin estndar. La porcin estndar puede ser mayor  o menor que 1 porcin de carbohidratos. Para saber cuntas porciones de carbohidratos hay en un alimento:   Primero mire el tamao de la porcin estndar de la [place]iqueta.   Luego verifique el total de gramos de carbohidratos. Esta es la cantidad de carbohidratos en 1 porcin estndar. Divida el total de gramos de carbohidratos por 15. Este nmero equivale al nmero de porciones de carbohidratos en 1 porcin estndar. Recuerde: 1 porcin de carbohidratos equivale a 15 gramos de carbohidratos.   Nota: Puede ignorar los gramos de Morgan Stanleyazcar en los Datos de Nutricin, ya que estn incluidos en el total de gramos de carbohidratos.    Listas de alimentos para el conteo de carbohidratos  1 porcin = cerca de 15 gramos de carbohidratos  Almidones   1 rebanada de pan (1 onza)   1 tortilla (6 pulgadas)    rosca de pan (bagel) grande (1 onza)   2 tortillas para taco (5 pulgadas)    pan para hamburguesa o para salchicha (hot dog) (3/4 onza)    taza de cereal listo para comer sin endulzar    taza de cereal cocido   1 taza de sopa a base de caldo   4-6 galletitas saladas   ? taza de pasta o arroz (cocidos)    taza de frijoles, chcharos, granos de elote, camotes (batatas, boniatos), calabaza (zapallo), pur de papas o papas hervidas (cocidos)    papa grande asada (3 onzas)    onza de pretzels, papitas o totopos (tortilla chips)   3 tazas de palomitas de maz (popcorn) (ya preparadas)   Frutas   1 fruta fresca pequea ( a 1 taza)    taza de fruta enlatada o congelada   17 uvas pequeas (3 onzas)   1 taza de meln, bayas (moras)    vaso de jugo de fruta   2 cucharadas de frutas secas (arndanos azules/blueberries, cerezas, arndanos rojos/cranberries, frutas surtidas, uvas pasas/pasitas)  Leche   1 taza de PPG Industriesleche descremada o reducida en grasa   1 taza de leche de soya   ? taza de yogur descremado endulzado con un edulcorante sin azcar (6 onzas)   Dulces y postres     pastel cuadrado de 2 pulgadas (sin betn/cobertura)   2 galletitas dulces (? onzas)    taza de helado o yogur congelado    taza de sorbete (sherbet) o nieve (sorbet)   1 cucharada de jarabe (sirope), mermelada, jalea, azcar o miel   2 cucharadas de jarabe bajo en caloras    Otros alimentos   Cuente 1 taza de vegetales crudos o  taza de vegetales sin almidn, cocidas, como porciones de alimentos con cero (0) carbohidratos o sin restriccin. Si come 3 o ms porciones en una comida, cuntelas como 1 porcin de carbohidratos.   Los alimentos que contienen menos de 20 caloras en cada porcin tambin pueden contarse como porciones con cero carbohidratos o alimentos sin restriccin.   Cuente 1 taza de guiso (estofado) u otros alimentos combinados como 2 porciones de carbohidratos.    Contar carbohidratos y la diabetes:  Ejemplo de men para 1 da Desayuno  1 pltano/banana pequeo (1 carbohidrato)   taza de hojuelas de maz (cornflakes) (1 carbohidrato)  1 taza de leche descremada o baja en grasa (1 carbohidrato)  1 rebanada de pan de trigo integral (1 carbohidrato)  1 cucharadita de margarina   Almuerzo  2 onzas de rebanadas de [place]  2 rebanadas de pan de trigo integral (2 carbohidratos)  2 hojas de Company secretary  4 palitos de apio  4 palitos de zanahoria  1 Environmental health practitioner (1 carbohidrato)  1 taza de leche descremada o baja en grasa (1 carbohidrato)   Refrigerio  2 cucharadas de uvas pasas/pasitas (1 carbohidrato)   onzas de mini pretzels sin sal (1 carbohidrato)   Cena  3 onzas de carne asada de res, magra   papa grande asada (2 carbohidratos)  1 cucharada de crema agria reducida en grasa   taza de ejotes/habichuelas verdes/chauchas  1 taza de ensalada de vegetales  1 cucharada de aderezo para ensaladas reducido en caloras  1 panecillo de trigo integral (1 carbohidrato)  1 cucharadita de margarina  1 taza de bolitas de meln (1 carbohidrato)   Refrigerio  6 onzas  de yogur de frutas bajo en grasa, sin azcar (1 carbohidrato)  2 cucharadas de nueces sin sal     Plan de alimentacin con bajo contenido de fibra  Para 4 semanas Low-Fiber Eating Plan La fibra se encuentra en las frutas, las verduras, los cereales integrales y las legumbres. Consumir una dieta con bajo contenido de [place] a reducir la frecuencia de las deposiciones y la cantidad de cada deposicin. Un plan de alimentacin con bajo contenido de fibra puede ayudar a que su aparato digestivo se cure en los siguientes casos:  Padece ciertas enfermedades, como la enfermedad de Crohn o diverticulitis.  Recibi radioterapia recientemente en la pelvis o los intestinos.  Acaba de someterse a una ciruga intestinal.  Tiene un nuevo orificio quirrgico en el abdomen (colostoma o ileostoma).  Tiene un estrechamiento de intestino (estenosis). El mdico determinar durante cunto tiempo deber seguir esta dieta. El mdico puede recomendarle que trabaje con un especialista en dietas y alimentacin (nutricionista). Consejos para seguir [employer] Corporation plan [place] generales  Siga las indicaciones de su nutricionista sobre la cantidad de fibra diaria que necesita incorporar.  La mayora de las personas que siguen este plan de alimentacin deben tratar de consumir menos de 10gramos (g) de [employer]. Su meta de cantidad de fibra diaria es ________________g.  Tome los suplementos vitamnicos y Owens[employer]Illinois se lo hayan indicado el mdico o el nutricionista. Las presentaciones lquidas o masticables son las mejores para Surveyor, minerals de alimentacin. Leer las etiquetas de los alimentos  Consulte las etiquetas de los alimentos para saber la cantidad de fibra alimentaria que contienen.  Elija alimentos que tengan menos de 2gramos de fibra por porcin. Coccin  Use harina blanca y otros granos permitidos para hornear y Water quality scientist.  Cocine la carne con mtodos que la dejen [place], como en estofado o  hervida.  Cocine los [employer] Pal[employer]ive que la yema est bien slida.  Elijas las grasas saludables, como el aceite de oliva o el aceite de canola. Planificacin de las comidas   Haga 5o6comidas pequeas durante el da, en lugar de 3comidas abundantes.  Si es intolerante a la lactosa: ? Elija alimentos lcteos con bajo contenido de lactosa. ? No consuma productos lcteos si as se lo indica su nutricionista.  Limite las grasas y los aceites a  menos de Chief Operating Officer.  Coma porciones pequeas de postres. Qu alimentos estn permitidos? Esta podra no ser [employer]on. Hable con el nutricionista sobre las mejores opciones alimentarias para usted. Carbohidratos [employer]co Corporation panes y las galletas saladas elaborados con harina blanca. Waffles, panqueques y tostadas francesas. [place]. Pretzels. Tostadas Melba, biscote y Development worker, community. Cereales cocidos y secos que no contengan granos enteros, agregado de [place], semillas o frutas disecadas. Harina de maz. Almidn. Cereales calientes y fros hechos con maz, trigo, arroz o avena refinados. Pasta sin relleno y fideos. Arroz blanco. Verduras Verduras bien cocidas o enlatadas sin semillas, piel ni tallos. Papas cocidas, sin cscara. Jugo de verduras. Nils Pyle Frutas cocidas o enlatadas, sin semillas ni cscara. Banana madura pelada. Pur de [employer]. Jugos de frutas sin pulpa. Carnes y otros alimentos ricos en protenas Carne molida. Cortes tiernos de carne o aves. Huevos. Pescado, frutos de mar y [employer] Global. [place] suave de frutos secos. Tofu. Lcteos Las bebidas y los productos lcteos en su totalidad. Leches deslactosadas, incluidas las leches de [place], soja y [employer]. Yogurt sin mezclas de frutas, frutos secos, chocolate o granola. PPG Industries. [employer]. Queso. Bebidas Caf descafeinado. Jugos de frutas y verduras, o batidos (pequeas cantidades, sin pulpa ni piel, y con frutas de la lista permitida). Bebidas deportivas. T de  hierbas. Grasas y [address] Aceite de [place], de canola, de [place], de linasa y de semillas de [place]. Mayonesa. Queso crema. Margarina. Mantequilla. Dulces y postres Bizcochuelos y Programmer, systems. Pasteles con crema y hechos con las frutas permitidas. Pudin. Natillas. Gelatina de frutas. Sorbete. Helados de agua. Helados sin frutos secos. Caramelos duros comunes. Miel. Gelatina. Melaza. Jarabes, incluido el jarabe de chocolate. Chocolate. Malvaviscos. Pastillas de goma. Condimentos y otros alimentos Consom. Caldos. Sopas crema hechas con los alimentos permitidos. Sopas coladas. Cazuelas preparadas con alimentos permitidos. [place]. Mostaza suave. Aderezos suaves para ensaladas. Salsas naturales. Vinagre. Especias con moderacin. Sal. Azcar. Qu alimentos no estn permitidos? Esta podra no ser Raytheon. Hable con el nutricionista sobre las mejores opciones alimentarias para usted. Carbohidratos Todo tipo de Hershey Company y panes integrales y de grano entero. Panes y Designer, jewellery. Pan de centeno. Cereales integrales y multigrano. Cereales con frutos secos, pasas de uva o coco. Salvado. Cereales de grano grueso. Granola. Cereales con alto contenido de [place]. Pan de maz o de harina de maz. Pastas integrales. Arroz integral o arroz salvaje. Quinua. Palomitas de maz. Trigo sarraceno. Germen de trigo. Verduras Cscara de papas. Verduras crudas o semicocidas. Todas las legumbres y brotes de soja. Vegetales de Marriott cocidos. Maz. Guisantes. Repollo. Remolachas. Brcoli. Repollitos de Bruselas. Coliflor. Hongos. Cebollas. Pimienta. Chiriva. Calal. Chucrut. Nils Pyle Frutas crudas o disecadas. Frutos rojos. Jugo de frutas con pulpa. Jugo de ciruelas. Carnes y otros alimentos ricos en protenas Carnes duras y fibrosas con TEFL teacher. Carnes grasas. Carne de ave con piel. Carne de vaca, aves o pescado. Embutidos y fiambres. Salchichas, tocino y perritos calientes. Frutos secos y  [place] con trozos de frutos secos. Arvejas secas, guisantes y lentejas. Lcteos Yogurt con mezclas de frutas, frutos secos, chocolate o granola. Bebidas T y caf no descafeinados. Grasas y Arts development officer. Coco. Lambert Mody y postres Postres, galletas dulces o golosinas que contengan frutos secos o [place]. Frutas desecadas. Jaleas y conservas sin semillas. Mermeladas. Cualquier postre hecho con frutas o granos que no estn permitidos. Condimentos y otros alimentos [address]. Sopas hechas con verduras o granos no permitidos. Salsa de pepinillos. Rbano picante. Pepinillos. Aceitunas. Resumen  Aflac Incorporated de las  personas que siguen un plan de alimentacin de bajo contenido de fibra deben tratar de consumir menos de 10gramos de Firefighter. Siga las indicaciones de su nutricionista sobre la cantidad de fibra diaria que necesita incorporar.  Siempre controle las etiquetas de los alimentos para saber el contenido de fibra alimentaria de los alimentos envasados. En general, un alimento con bajo contenido de [place] tiene menos de 2gramos de fibra por porcin.  Como regla general, trate de evitar los granos enteros, las frutas y verduras crudas, las frutas disecadas, los cortes duros de carnes, los frutos secos y las semillas.  Tome un suplemento de vitaminas y Owens-Illinois se lo haya indicado el mdico o el nutricionista. Esta informacin no tiene Theme park manager el consejo del mdico. Asegrese de hacerle al mdico cualquier pregunta que tenga. Document Released: 07/15/2005 Document Revised: 02/21/2017 Document Reviewed: 02/21/2017 Elsevier Patient Education  2020 [employer].   Dieta rica en fibra High-Fiber Diet [place], tambin llamada fibra dietaria, es un tipo de carbohidrato que se encuentra en las frutas, las verduras, los cereales integrales y los frijoles. Una dieta rica en fibra puede tener muchos beneficios para la salud. El mdico puede recomendar una dieta rica en fibra para  ayudar a:  Chief Strategy Officer. La fibra puede hacer que defeque con ms frecuencia.  Disminuir el nivel de colesterol.  Aliviar las siguientes afecciones: ? Hinchazn de la venas en el ano (hemorroides). ? Hinchazn e irritacin (inflamacin) de zonas especficas del tracto digestivo (diverticulitis sin complicaciones). ? Un problema del intestino grueso (colon) que, a veces, causa dolor y diarrea (sndrome de colon irritable, SCI).  Evitar comer en exceso como parte de un plan para bajar de peso.  Evitar la enfermedad cardaca, la diabetes tipo 2 y ciertos cnceres. En qu consiste el plan? El consumo diario recomendado de fibra en gramos (g) incluye lo siguiente:  38g para hombres de 50 aos o menos.  30g para los hombres [address] de [place].  25g para mujeres de 50 aos o menos.  21g para las Coca Cola de [place]. Puede recibir la ingesta diaria recomendada de fibra dietaria de la siguiente manera:  Coma una variedad de frutas, verduras, granos y frijoles.  Tome un suplemento de [place], si no es posible comer suficiente fibra en su dieta. Qu debo saber acerca de la dieta rica en fibra?  Es mejor obtener fibra directamente de los alimentos en lugar de recibirla de suplementos de [place]. No hay demasiada investigacin acerca de qu tan efectivos son los suplementos.  Verifique siempre el contenido de fibra en la etiqueta de informacin nutricional de los alimentos preenvasados. Busque alimentos que contengan 5g o ms de fibra por porcin.  Hable con un especialista en alimentacin y nutricin (nutricionista) si tiene preguntas sobre alimentos especficos que se recomiendan o no para su afeccin mdica, especialmente si aquellos alimentos no estn detallados a continuacin.  Aumente gradualmente la cantidad de fibra que consume. Si aumenta demasiado rpido el consumo de fibra dietaria puede provocar distensin abdominal, clicos o gases.  Beba abundante agua. El  [place] a Geophysicist/field seismologist. Cules son algunos consejos para seguir este plan?  Consuma una gran variedad de alimentos ricos en fibra.  Asegrese de que al Coca-Cola mitad de los granos que ingiere cada da sean cereales integrales.  Coma panes y cereales hechos con harina de cereales integrales en lugar de harina refinada o blanca.  Coma arroz integral, trigo burgol o mijo en  lugar de Surveyor, minerals con un desayuno rico en [place], como un cereal que contenga al menos 5g de fibra o ms por porcin.  Use frijoles en lugar de carne en las sopas, ensaladas o platos de pastas.  Coma colaciones ricas en fibra, como frutos rojos, verduras crudas, frutos secos y palomitas de maz.  Elija frutas y verduras Environmental consultant de formas procesadas como jugo o salsa. Qu alimentos puedo comer?  Frutas Frutos rojos. Peras. Manzanas. Naranjas. [place]. Ciruelas y pasas. Higos secos. Verduras Batatas. [place]. Col rizada. Alcachofas. Repollo. Brcoli. Coliflor. Guisantes. Zanahorias. Calabaza. Cereales Panes integrales. Cereal multigrano. Avena. Arroz integral. Gypsy Decant. Trigo burgol. Mijo. Quinua. Magdalenas de salvado. Palomitas de maz. Galletas de centeno. Carnes y otras protenas Frijoles blancos, colorados y pintos. Soja. Guisantes secos. Lentejas. Frutos secos y semillas. Lcteos Yogur fortificado con Research scientist (life sciences). Bebidas Leche de soja fortificada con Bjorn Loser. Jugo de naranja fortificado con Bjorn Loser. Otros alimentos Barras de [place]. Es posible que los productos mencionados arriba no formen una lista completa de las bebidas y los alimentos recomendados. Comunquese con un nutricionista para conocer ms opciones. Qu alimentos no se recomiendan? Frutas Jugo de frutas. Frutas cocidas coladas. Verduras Papas fritas. Verduras enlatadas. Verduras muy cocidas. Cereales Pan blanco. Pastas hechas con Webb Laws. Arroz Melburn Popper y otras protenas Cortes de carne con  grasa. Pollo o pescado fritos. Lcteos Leche. Yogur. Queso crema. PPG Industries. Grasas y ITT Industries. Bebidas Gaseosas. Otros alimentos Tortas y pasteles. Es posible que los productos que se enumeran ms arriba no sean una lista completa de los alimentos y las bebidas que se Theatre stage manager. Comunquese con un nutricionista para obtener ms informacin. Resumen  La fibra es un tipo de carbohidrato. Se encuentra en las frutas, las verduras, los cereales integrales y los frijoles.  Una dieta rica en fibra representa muchos beneficios para la salud, como evitar el estreimiento, Copy colesterol en la [place], ayudar a perder peso y reducir el riesgo de enfermedad cardaca, diabetes y ciertos tipos de cncer.  Aumente gradualmente la ingesta de fibra. El aumento demasiado rpido puede provocar clicos, distensin abdominal y gases. Beba mucha agua a la vez que [employer].  Las mejores fuentes de fibra incluyen frutas y verduras enteras, granos integrales, frutos secos, semillas y frijoles. Esta informacin no tiene Theme park manager el consejo del mdico. Asegrese de hacerle al mdico cualquier pregunta que tenga. Document Released: 07/15/2005 Document Revised: 10/25/2017 Document Reviewed: 07/04/2017 Elsevier Patient Education  2020 [employer].

## 2019-05-10 NOTE — Discharge Summary (Signed)
Physician Discharge Summary  Shine Scrogham OJJ:[id number] DOB: 17-May-1971 DOA: 05/06/2019  PCP: Patient, No Pcp Per  Admit date: 05/06/2019 Discharge date: 05/10/2019  Admitted From: Home Disposition: Home  Recommendations for Outpatient Follow-up:  1. Follow up with PCP in 1 week with repeat CBC/BMP 2. Outpatient follow-up with general surgery. 3. Follow up in ED if symptoms worsen or new appear   Home Health: No Equipment/Devices: None  Discharge Condition: Stable CODE STATUS: Full Diet recommendation: Heart healthy/carb modified/low fiber diet for 4 weeks then high-fiber diet  Brief/Interim Summary: 48 year old female with no past medical history presented with abnormal blood sugar along with left lower quadrant abdominal pain and outpatient CT of the abdomen done by PCP showing acute diverticulitis with localized perforation at the level of distal descending colon.  In the ED, her glucose was 941 with elevated anion gap.  She was started on intravenous fluids, intravenous insulin and antibiotics.  We will hospitalization, her condition improved.  She was transitioned to long-acting insulin.  General surgery was consulted.  Diet was gradually introduced and advanced.  She is tolerating diet, afebrile and hemodynamically stable.  General surgery has cleared the patient for discharge on oral antibiotics.  She will be discharged home today with outpatient follow-up with PCP and general surgery.  Discharge Diagnoses:  Sepsis: Present on admission Acute diverticulitis with localized perforation at the level of distal descending colon -Patient was on Bactrim and Flagyl as an outpatient. -CT of the abdomen done by PCP showing acute diverticulitis with localized perforation at the level of distal descending colon. -Currently on Rocephin and Flagyl. -Cultures negative so far. -General surgery following.   Diet was gradually introduced and advanced as per general surgery.  She is tolerating  diet, afebrile with no leukocytosis and no worsening abdominal pain.  General surgery has cleared the patient for discharge on oral antibiotics.  She will be discharged home on ciprofloxacin and Flagyl for 10 more days.  She needs to be on low fiber diet for 4 weeks followed by high-fiber diet.  Outpatient follow-up with general surgery.  DKA/new diagnosis of uncontrolled diabetes mellitus type 2 -Presented with glucose of 941 with elevated anion gap. -Started on insulin drip.  Switched to long-acting insulin after anion gap closed.  -hemoglobin A1c 12.4.  Diabetes coordinator following -Blood sugars improving.    Continue Levemir on discharge.  Carb and fat diet.  Outpatient follow-up with PCP.  Acute kidney injury -Resolved with IV fluids.  Hypokalemia -Resolved  Hypomagnesemia  -Resolved  Discharge Instructions  Discharge Instructions    Diet - low sodium heart healthy   Complete by: As directed    Diet Carb Modified   Complete by: As directed    Increase activity slowly   Complete by: As directed      Allergies as of 05/10/2019   No Known Allergies     Medication List    STOP taking these medications   ondansetron 4 MG disintegrating tablet Commonly known as: Zofran ODT   sulfamethoxazole-trimethoprim 800-160 MG tablet Commonly known as: BACTRIM DS     TAKE these medications   blood glucose meter kit and supplies Kit Dispense based on patient and insurance preference. Use up to four times daily as directed. (FOR ICD-9 250.00, 250.01).   ciprofloxacin 500 MG tablet Commonly known as: Cipro Take 1 tablet (500 mg total) by mouth 2 (two) times daily for 10 days.   ibuprofen 600 MG tablet Commonly known as: ADVIL Take 1 tablet (600 mg  total) by mouth every 6 (six) hours as needed.   insulin detemir 100 UNIT/ML injection Commonly known as: Levemir Inject 0.15 mLs (15 Units total) into the skin daily.   INSULIN SYRINGE .5CC/31GX5/16" 31G X 5/16" 0.5 ML  Misc Levemir 15 units daily   metroNIDAZOLE 500 MG tablet Commonly known as: FLAGYL Take 1 tablet (500 mg total) by mouth 3 (three) times daily for 10 days. What changed: when to take this   ondansetron 4 MG tablet Commonly known as: ZOFRAN Take 4 mg by mouth every 8 (eight) hours as needed for nausea or vomiting.   traMADol 50 MG tablet Commonly known as: Ultram Take 1 tablet (50 mg total) by mouth every 6 (six) hours as needed for severe pain.      Follow-up Information    [facility], [place] Follow up in 4 week(s).   Specialty: General Surgery Contact information: [address] [place] [address] [phone]        PCP. Schedule an appointment as soon as possible for a visit in 1 week(s).   Why: with repeat cbc/bmp         No Known Allergies  Consultations:  General surgery   Procedures/Studies:  No results found.    Subjective: Patient seen and examined at bedside.  She denies any overnight worsening abdominal pain, fever, nausea or vomiting.  She feels okay to go home today.  Discharge Exam: Vitals:   05/09/19 2057 05/10/19 0616  BP: 100/67 104/66  Pulse: 85 76  Resp:    Temp: 98.8 F (37.1 C) 98.5 F (36.9 C)  SpO2: 99% 100%    General: Pt is alert, awake, not in acute distress Cardiovascular: rate controlled, S1/S2 + Respiratory: bilateral decreased breath sounds at bases Abdominal: Soft, NT, ND, bowel sounds + Extremities: no edema, no cyanosis    The results of significant diagnostics from this hospitalization (including imaging, microbiology, ancillary and laboratory) are listed below for reference.     Microbiology: Recent Results (from the past 240 hour(s))  SARS CORONAVIRUS 2 (TAT 6-24 HRS) Nasopharyngeal Nasopharyngeal Swab     Status: None   Collection Time: 05/06/19  2:42 PM   Specimen: Nasopharyngeal Swab  Result Value Ref Range Status   SARS Coronavirus 2 NEGATIVE NEGATIVE Final    Comment:  (NOTE) SARS-CoV-2 target nucleic acids are NOT DETECTED. The SARS-CoV-2 RNA is generally detectable in upper and lower respiratory specimens during the acute phase of infection. Negative results do not preclude SARS-CoV-2 infection, do not rule out co-infections with other pathogens, and should not be used as the sole basis for treatment or other patient management decisions. Negative results must be combined with clinical observations, patient history, and epidemiological information. The expected result is Negative. Fact Sheet for Patients: SugarRoll.be Fact Sheet for Healthcare Providers: https://www.woods-mathews.com/ This test is not yet approved or cleared by the [place] FDA and  has been authorized for detection and/or diagnosis of SARS-CoV-2 by FDA under an Emergency Use Authorization (EUA). This EUA will remain  in effect (meaning this test can be used) for the duration of the COVID-19 declaration under Section 56 4(b)(1) of the Act, 21 U.S.C. section 360bbb-3(b)(1), unless the authorization is terminated or revoked sooner. Performed at [facility], [address], [place], [place] [address]   Culture, blood (x 2)     Status: None (Preliminary result)   Collection Time: 05/06/19  2:43 PM   Specimen: BLOOD LEFT HAND  Result Value Ref  Range Status   Specimen Description BLOOD LEFT HAND  Final   Special Requests   Final    BOTTLES DRAWN AEROBIC ONLY Blood Culture adequate volume   Culture   Final    NO GROWTH 4 DAYS Performed at [facility], [address], [place], [place] [address]    Report Status PENDING  Incomplete  Culture, blood (x 2)     Status: None (Preliminary result)   Collection Time: 05/07/19  3:39 AM   Specimen: BLOOD  Result Value Ref Range Status   Specimen Description BLOOD LEFT HAND  Final   Special Requests   Final    BOTTLES DRAWN AEROBIC AND ANAEROBIC Blood Culture adequate volume    Culture   Final    NO GROWTH 3 DAYS Performed at [facility], [place] [address], [place], [place] [address]    Report Status PENDING  Incomplete     Labs: BNP (last 3 results) No results for input(s): BNP in the last 8760 hours. Basic Metabolic Panel: Recent Labs  Lab 05/06/19 2050 05/07/19 0339 05/08/19 0356 05/09/19 0415 05/10/19 0719  NA 145 147* 142 136 136  K 3.3* 3.1* 3.4* 2.5* 3.6  CL 115* 119* 109 100 102  CO2 18* 19* '24 26 24  '$ GLUCOSE 254* 156* 162* 131* 167*  BUN 14 11 5* <5* <5*  CREATININE 0.69 0.57 0.48 0.43* 0.51  CALCIUM 7.7* 7.6* 7.8* 7.6* 7.9*  MG  --   --  1.8 1.6* 1.7   Liver Function Tests: Recent Labs  Lab 05/06/19 1142 05/07/19 0339  AST 22 18  ALT 44 27  ALKPHOS 141* 85  BILITOT 1.8* 0.9  PROT 8.0 5.5*  ALBUMIN 4.4 3.0*   Recent Labs  Lab 05/06/19 1142  LIPASE 33   No results for input(s): AMMONIA in the last 168 hours. CBC: Recent Labs  Lab 05/06/19 1142 05/06/19 1319 05/07/19 0339 05/08/19 0356 05/09/19 0415  WBC 10.8*  --  10.5 5.7 4.6  NEUTROABS  --   --   --  3.2 1.9  HGB 16.5* 16.7* 12.5 12.2 11.5*  HCT 49.1* 49.0* 36.4 36.6 33.6*  MCV 91.9  --  91.5 92.7 89.6  PLT 373  --  242 193 160   Cardiac Enzymes: No results for input(s): CKTOTAL, CKMB, CKMBINDEX, TROPONINI in the last 168 hours. BNP: Invalid input(s): POCBNP CBG: Recent Labs  Lab 05/09/19 0734 05/09/19 1051 05/09/19 1554 05/09/19 2055 05/10/19 0742  GLUCAP 147* 174* 260* 203* 180*   D-Dimer No results for input(s): DDIMER in the last 72 hours. Hgb A1c No results for input(s): HGBA1C in the last 72 hours. Lipid Profile No results for input(s): CHOL, HDL, LDLCALC, TRIG, CHOLHDL, LDLDIRECT in the last 72 hours. Thyroid function studies No results for input(s): TSH, T4TOTAL, T3FREE, THYROIDAB in the last 72 hours.  Invalid input(s): FREET3 Anemia work up No results for input(s): VITAMINB12, FOLATE, FERRITIN, TIBC, IRON, RETICCTPCT in the last 72  hours. Urinalysis    Component Value Date/Time   COLORURINE STRAW (A) 05/06/2019 1225   APPEARANCEUR CLEAR 05/06/2019 1225   LABSPEC 1.030 05/06/2019 1225   PHURINE 5.0 05/06/2019 1225   GLUCOSEU >=500 (A) 05/06/2019 1225   HGBUR NEGATIVE 05/06/2019 1225   BILIRUBINUR NEGATIVE 05/06/2019 1225   KETONESUR 80 (A) 05/06/2019 1225   PROTEINUR NEGATIVE 05/06/2019 1225   UROBILINOGEN 1.0 10/08/2010 2245   NITRITE NEGATIVE 05/06/2019 [place] 05/06/2019 1225   Sepsis Labs Invalid  input(s): PROCALCITONIN,  WBC,  LACTICIDVEN Microbiology Recent Results (from the past 240 hour(s))  SARS CORONAVIRUS 2 (TAT 6-24 HRS) Nasopharyngeal Nasopharyngeal Swab     Status: None   Collection Time: 05/06/19  2:42 PM   Specimen: Nasopharyngeal Swab  Result Value Ref Range Status   SARS Coronavirus 2 NEGATIVE NEGATIVE Final    Comment: (NOTE) SARS-CoV-2 target nucleic acids are NOT DETECTED. The SARS-CoV-2 RNA is generally detectable in upper and lower respiratory specimens during the acute phase of infection. Negative results do not preclude SARS-CoV-2 infection, do not rule out co-infections with other pathogens, and should not be used as the sole basis for treatment or other patient management decisions. Negative results must be combined with clinical observations, patient history, and epidemiological information. The expected result is Negative. Fact Sheet for Patients: SugarRoll.be Fact Sheet for Healthcare Providers: https://www.woods-mathews.com/ This test is not yet approved or cleared by the [place] FDA and  has been authorized for detection and/or diagnosis of SARS-CoV-2 by FDA under an Emergency Use Authorization (EUA). This EUA will remain  in effect (meaning this test can be used) for the duration of the COVID-19 declaration under Section 56 4(b)(1) of the Act, 21 U.S.C. section 360bbb-3(b)(1), unless the authorization  is terminated or revoked sooner. Performed at [facility], [place] [address] [place], [place] [address]   Culture, blood (x 2)     Status: None (Preliminary result)   Collection Time: 05/06/19  2:43 PM   Specimen: BLOOD LEFT HAND  Result Value Ref Range Status   Specimen Description BLOOD LEFT HAND  Final   Special Requests   Final    BOTTLES DRAWN AEROBIC ONLY Blood Culture adequate volume   Culture   Final    NO GROWTH 4 DAYS Performed at [facility], [place] [address] [place], [place] [address]    Report Status PENDING  Incomplete  Culture, blood (x 2)     Status: None (Preliminary result)   Collection Time: 05/07/19  3:39 AM   Specimen: BLOOD  Result Value Ref Range Status   Specimen Description BLOOD LEFT HAND  Final   Special Requests   Final    BOTTLES DRAWN AEROBIC AND ANAEROBIC Blood Culture adequate volume   Culture   Final    NO GROWTH 3 DAYS Performed at [facility], [place] [address] [place], [place] [address]    Report Status PENDING  Incomplete     Time coordinating discharge: 35 minutes  SIGNED:   Aline August, MD  [facility] 05/10/2019, 9:57 AM

## 2019-05-11 LAB — CULTURE, BLOOD (ROUTINE X 2)
Culture: NO GROWTH
Special Requests: ADEQUATE

## 2019-05-13 LAB — CULTURE, BLOOD (ROUTINE X 2)
Culture: NO GROWTH
Special Requests: ADEQUATE

## 2019-05-18 ENCOUNTER — Encounter (INDEPENDENT_AMBULATORY_CARE_PROVIDER_SITE_OTHER): Payer: Self-pay | Admitting: Primary Care

## 2019-05-18 ENCOUNTER — Other Ambulatory Visit: Payer: Self-pay

## 2019-05-18 ENCOUNTER — Ambulatory Visit (INDEPENDENT_AMBULATORY_CARE_PROVIDER_SITE_OTHER): Payer: Self-pay | Admitting: Primary Care

## 2019-05-18 ENCOUNTER — Other Ambulatory Visit (INDEPENDENT_AMBULATORY_CARE_PROVIDER_SITE_OTHER): Payer: Self-pay | Admitting: Primary Care

## 2019-05-18 VITALS — BP 118/83 | HR 76 | Temp 97.3°F | Ht 59.0 in | Wt 145.0 lb

## 2019-05-18 DIAGNOSIS — R1032 Left lower quadrant pain: Secondary | ICD-10-CM

## 2019-05-18 DIAGNOSIS — Z6829 Body mass index (BMI) 29.0-29.9, adult: Secondary | ICD-10-CM

## 2019-05-18 DIAGNOSIS — Z7689 Persons encountering health services in other specified circumstances: Secondary | ICD-10-CM

## 2019-05-18 DIAGNOSIS — E119 Type 2 diabetes mellitus without complications: Secondary | ICD-10-CM

## 2019-05-18 DIAGNOSIS — Z09 Encounter for follow-up examination after completed treatment for conditions other than malignant neoplasm: Secondary | ICD-10-CM

## 2019-05-18 DIAGNOSIS — E1165 Type 2 diabetes mellitus with hyperglycemia: Secondary | ICD-10-CM

## 2019-05-18 LAB — GLUCOSE, POCT (MANUAL RESULT ENTRY): POC Glucose: 233 mg/dl — AB (ref 70–99)

## 2019-05-18 MED ORDER — INSULIN DETEMIR 100 UNIT/ML ~~LOC~~ SOLN: 15.0000 [IU] | Freq: Every day | SUBCUTANEOUS | 3 refills | Status: DC

## 2019-05-18 MED ORDER — LANTUS SOLOSTAR 100 UNIT/ML ~~LOC~~ SOPN: 15.0000 [IU] | PEN_INJECTOR | Freq: Every day | SUBCUTANEOUS | 99 refills | Status: DC

## 2019-05-18 MED ORDER — METFORMIN HCL 1000 MG PO TABS: 1000.0000 mg | ORAL_TABLET | Freq: Two times a day (BID) | ORAL | 3 refills | Status: DC

## 2019-05-18 MED ORDER — SITAGLIPTIN PHOSPHATE 100 MG PO TABS: 100.0000 mg | ORAL_TABLET | Freq: Every day | ORAL | 3 refills | Status: DC

## 2019-05-18 NOTE — Patient Instructions (Signed)
Diabetes mellitus y actividad física °Diabetes Mellitus and Exercise °Hacer actividad física habitualmente es importante para el estado de salud general, en especial si tiene diabetes (diabetes mellitus). La actividad física no solo se reduce a bajar de peso. Aporta muchos beneficios para la salud, como aumento de la fuerza muscular y la densidad ósea, y reducción de las grasas corporales y el estrés. Esto mejora el estado físico, la flexibilidad y la resistencia, y todo ello redunda en un mejor estado de salud general. °La actividad física tiene beneficios adicionales para los diabéticos, entre ellos: °· Disminuye el apetito. °· Ayuda a bajar y mantener la glucemia bajo control. °· Baja la presión arterial. °· Ayuda a controlar las cantidades de sustancias grasas (lípidos) en la sangre, como el colesterol y los triglicéridos. °· Mejora la respuesta del cuerpo a la insulina (optimización de la sensibilidad a la insulina). °· Reduce la cantidad de insulina que el cuerpo necesita. °· Reduce el riesgo de sufrir cardiopatía coronaria de la siguiente forma: °? Baja los niveles de colesterol y triglicéridos. °? Aumenta los niveles de colesterol bueno. °? Disminuye la glucemia. °¿Cuál es mi plan de actividad? °El médico o un educador para la diabetes certificado pueden ayudarlo a elaborar un plan respecto del tipo y de la frecuencia de actividad física (plan de actividades) adecuado para usted. Asegúrese de lo siguiente: °· Haga por lo menos 150 minutos semanales de ejercicios de intensidad moderada o vigorosa. Estos podrían ser caminatas dinámicas, ciclismo o gimnasia acuática. °? Haga ejercicios de elongación y de fortalecimiento, como yoga o levantamiento de pesas, por lo menos 2 veces por semana. °? Reparta la actividad en al menos 3 días de la semana. °· Haga algún tipo de actividad física todos los días. °? No deje pasar más de 2 días seguidos sin hacer algún tipo de actividad física. °? Evite permanecer inactivo  durante más de 30 minutos seguidos. Tómese descansos frecuentes para caminar o estirarse. °· Elija un tipo de ejercicio o de actividad que disfrute y establezca objetivos realistas. °· Comience lentamente y aumente de manera gradual la intensidad del ejercicio con el correr del tiempo. °¿Qué debo saber acerca del control de la diabetes? ° °· Contrólese la glucemia antes y después de ejercitarse. °? Si la glucemia es de 240 mg/dl (13,3 mmol/l) o más antes de comenzar a hacer actividad física, controle la orina para detectar la presencia de cetonas. Si tiene cetonas en la orina, no haga ejercicio hasta que la glucemia se normalice. °? Si la glucemia es de 100 mg/dl (5.6 mmol/l) o menos, tome una colación que contenga entre 15 y 20 gramos de carbohidratos. Controle la glucemia 15 minutos después de la colación para asegurarse de que el nivel esté por encima de 100 mg/dl (5.6 mmol/l) antes de comenzar a hacer actividad física. °· Conozca los síntomas de la glucemia baja (hipoglucemia) y aprenda cómo tratarla. El riesgo de tener hipoglucemia aumenta durante y después de hacer actividad física. Los síntomas frecuentes de hipoglucemia pueden incluir los siguientes: °? Hambre. °? Ansiedad. °? Sudoración y piel húmeda. °? Confusión. °? Mareos o sensación de desvanecimiento. °? Aumento de la frecuencia cardíaca o palpitaciones. °? Visión borrosa. °? Hormigueo o adormecimiento alrededor de la boca, los labios o la lengua. °? Estremecimientos y temblores. °? Irritabilidad. °· Tenga una colación de carbohidratos de acción rápida disponible antes, durante y después de ejercitarse, a fin de evitar o tratar la hipoglucemia. °· Evite inyectarse insulina en las zonas del cuerpo que ejercitará. Por ejemplo, evite inyectarse insulina en: °? Los brazos,   si juega al tenis. °? Las piernas, si corre. °· Lleve registros de sus hábitos de actividad física. Esto puede ayudarlos a usted y al médico a adaptar el plan de control de la diabetes  según sea necesario. Escriba los siguientes datos: °? Los alimentos que consume antes y después de hacer actividad física. °? Los niveles de glucosa en la sangre antes y después de hacer ejericios. °? El tipo y cantidad de actividad física que realiza. °? Cuando se prevé que la insulina alcance su valor máximo, si usa insulina. No haga actividad física en los momentos en que insulina alcanza su valor máximo. °· Cuando comience un ejercicio o una actividad nuevos, trabaje con el médico para asegurarse de que la actividad sea segura para usted y para ajustar la insulina, los medicamentos o la ingesta de alimentos según sea necesario. °· Beba gran cantidad de agua mientras hace ejercicio para evitar la deshidratación o los golpes de calor. Beba suficiente líquido como para mantener la orina clara o de color amarillo pálido. °Resumen °· Hacer actividad física habitualmente es importante para el estado de salud general, en especial si tiene diabetes (diabetes mellitus). °· La actividad física aporta muchos beneficios para la salud, como aumentar la fuerza muscular y la densidad ósea, y reducir las grasas corporales y el estrés. °· El médico o un educador para la diabetes certificado pueden ayudarlo a elaborar un plan respecto del tipo y de la frecuencia de actividad física (plan de actividades) adecuado para usted. °· Cuando comience un ejercicio o una actividad nuevos, trabaje con el médico para asegurarse de que la actividad sea segura para usted y para ajustar la insulina, los medicamentos o la ingesta de alimentos según sea necesario. °Esta información no tiene como fin reemplazar el consejo del médico. Asegúrese de hacerle al médico cualquier pregunta que tenga. °Document Released: 08/04/2007 Document Revised: 05/12/2017 Document Reviewed: 12/25/2015 °Elsevier Patient Education © 2020 [employer] ° °

## 2019-05-18 NOTE — Progress Notes (Signed)
New Patient Office Visit  Subjective:  Patient ID: Christie Martinez, female    DOB: Jul 15, 1971  Age: 48 y.o. MRN: [id number]  CC:  Chief Complaint  Patient presents with  . Hospitalization Follow-up    DKA    HPI Christie Martinez presents for establishment of care and hospital follow up she presented to the emergency department on 05/06/2019 because she was having  left lower quadrant abdominal pain going on 3 weeks. She is no longer having abdominal pain and continuing to take her antibiotics.Uncontrolled diabetes A1C 12.4. with complications vision changes.  No past medical history on file.  No past surgical history on file.  Family History  Problem Relation Age of Onset  . Diabetes Father     Social History   Socioeconomic History  . Marital status: Single    Spouse name: Not on file  . Number of children: Not on file  . Years of education: Not on file  . Highest education level: Not on file  Occupational History  . Not on file  Social Needs  . Financial resource strain: Not on file  . Food insecurity    Worry: Not on file    Inability: Not on file  . Transportation needs    Medical: Not on file    Non-medical: Not on file  Tobacco Use  . Smoking status: Never Smoker  . Smokeless tobacco: Never Used  Substance and Sexual Activity  . Alcohol use: No  . Drug use: Never  . Sexual activity: Yes  Lifestyle  . Physical activity    Days per week: Not on file    Minutes per session: Not on file  . Stress: Not on file  Relationships  . Social Herbalist on phone: Not on file    Gets together: Not on file    Attends religious service: Not on file    Active member of club or organization: Not on file    Attends meetings of clubs or organizations: Not on file    Relationship status: Not on file  . Intimate partner violence    Fear of current or ex partner: Not on file    Emotionally abused: Not on file    Physically abused: Not on file    Forced  sexual activity: Not on file  Other Topics Concern  . Not on file  Social History Narrative  . Not on file    ROS Review of Systems  Eyes: Positive for visual disturbance.  Endocrine: Positive for polydipsia, polyphagia and polyuria.  Neurological: Positive for headaches.  All other systems reviewed and are negative.   Objective:   Today's Vitals: BP 118/83 (BP Location: Right Arm, Patient Position: Sitting, Cuff Size: Normal)   Pulse 76   Temp (!) 97.3 F (36.3 C) (Temporal)   Ht '4\' 11"'$  (1.499 m)   Wt 145 lb (65.8 kg)   SpO2 95%   BMI 29.29 kg/m   Physical Exam Vitals signs reviewed.  Constitutional:      Appearance: She is obese.  HENT:     Head: Normocephalic.     Right Ear: Tympanic membrane normal.     Left Ear: Tympanic membrane normal.  Eyes:     Extraocular Movements: Extraocular movements intact.     Pupils: Pupils are equal, round, and reactive to light.  Neck:     Musculoskeletal: Normal range of motion and neck supple.  Cardiovascular:     Rate and Rhythm: Normal rate and  regular rhythm.  Pulmonary:     Effort: Pulmonary effort is normal.     Breath sounds: Normal breath sounds.  Abdominal:     General: Bowel sounds are normal. There is distension.     Palpations: Abdomen is soft.  Musculoskeletal: Normal range of motion.  Skin:    General: Skin is warm and dry.  Neurological:     Mental Status: She is alert and oriented to person, place, and time.  Psychiatric:        Mood and Affect: Mood normal.     Assessment & Plan:  Serrina was seen today for hospitalization follow-up.  Diagnoses and all orders for this visit:  Type 2 diabetes mellitus without complication, without long-term current use of insulin (HCC) Diabetes is uncontrolled A1C 12.4 goal is 6.4 discussed low carbohydrate diet , rice, potato, breads , sweets  continue to take levimir 15 units daily added metformin '1000mg'$  twice daily and Janumet '1000mg'$  in AM . Emphasized taking all  diabetic  medication  After meals. Also discuss signs and symptoms of hypo/hyper glycemia  -     Glucose (CBG) -     CBC with Differential -     CMP14+EGFR -     metFORMIN (GLUCOPHAGE) 1000 MG tablet; Take 1 tablet (1,000 mg total) by mouth 2 (two) times daily with a meal.  Hospital discharge follow-up Admitted abnormal blood sugar 941 and  left lower quadrant abdominal patient CT of the abdomen acute diverticulitis with localized perforation at the level of distal descending colon. Schedule appoint to establish care and recommended labs and if abdominal pain continues refer general surgery.  Encounter to establish care Juluis Mire, NP-C will be your  (PCP) that will  provides answers to  health concern as well as continuing care of varied medical conditions, not limited by cause, organ system, or diagnosis.   Encounter for diabetic foot exam ([place]) Sensory exam of the foot is normal, tested with the monofilament. Good pulses, no lesions or ulcers, good peripheral pulses.  Body mass index (BMI) 29.0-29.9, adult Discussed weight loss recommended BMI 24 this will also help with lowering blood sugars. Recommended 30 mins of vigorous exercise daily or 150 hours a week.   Other orders -     sitaGLIPtin (JANUVIA) 100 MG tablet; Take 1 tablet (100 mg total) by mouth daily. -     insulin detemir (LEVEMIR) 100 UNIT/ML injection; Inject 0.15 mLs (15 Units total) into the skin daily.     Outpatient Encounter Medications as of 05/18/2019  Medication Sig  . blood glucose meter kit and supplies KIT Dispense based on patient and insurance preference. Use up to four times daily as directed. (FOR ICD-9 250.00, 250.01).  . ciprofloxacin (CIPRO) 500 MG tablet Take 1 tablet (500 mg total) by mouth 2 (two) times daily for 10 days.  . insulin detemir (LEVEMIR) 100 UNIT/ML injection Inject 0.15 mLs (15 Units total) into the skin daily.  . Insulin Syringe-Needle U-100 (INSULIN SYRINGE .5CC/31GX5/16") 31G X  5/16" 0.5 ML MISC Levemir 15 units daily  . metroNIDAZOLE (FLAGYL) 500 MG tablet Take 1 tablet (500 mg total) by mouth 3 (three) times daily for 10 days.  . ondansetron (ZOFRAN) 4 MG tablet Take 1 tablet (4 mg total) by mouth every 6 (six) hours as needed for nausea.  . traMADol (ULTRAM) 50 MG tablet Take 1 tablet (50 mg total) by mouth every 6 (six) hours as needed for severe pain.  . [DISCONTINUED] insulin detemir (LEVEMIR) 100  UNIT/ML injection Inject 0.15 mLs (15 Units total) into the skin daily.  . metFORMIN (GLUCOPHAGE) 1000 MG tablet Take 1 tablet (1,000 mg total) by mouth 2 (two) times daily with a meal.  . sitaGLIPtin (JANUVIA) 100 MG tablet Take 1 tablet (100 mg total) by mouth daily.  . [DISCONTINUED] ibuprofen (ADVIL,MOTRIN) 600 MG tablet Take 1 tablet (600 mg total) by mouth every 6 (six) hours as needed. (Patient not taking: Reported on 05/06/2019)   No facility-administered encounter medications on file as of 05/18/2019.     Follow-up: Return in about 3 months (around 08/18/2019) for Diabetes with meter or log in person .   Kerin Perna, NP

## 2019-05-19 LAB — CBC WITH DIFFERENTIAL/PLATELET
Basophils Absolute: 0 10*3/uL (ref 0.0–0.2)
Basos: 0 %
EOS (ABSOLUTE): 0 10*3/uL (ref 0.0–0.4)
Eos: 1 %
Hematocrit: 37.7 % (ref 34.0–46.6)
Hemoglobin: 12.6 g/dL (ref 11.1–15.9)
Immature Grans (Abs): 0 10*3/uL (ref 0.0–0.1)
Immature Granulocytes: 0 %
Lymphocytes Absolute: 1.4 10*3/uL (ref 0.7–3.1)
Lymphs: 38 %
MCH: 30.4 pg (ref 26.6–33.0)
MCHC: 33.4 g/dL (ref 31.5–35.7)
MCV: 91 fL (ref 79–97)
Monocytes Absolute: 0.4 10*3/uL (ref 0.1–0.9)
Monocytes: 9 %
Neutrophils Absolute: 1.9 10*3/uL (ref 1.4–7.0)
Neutrophils: 52 %
Platelets: 275 10*3/uL (ref 150–450)
RBC: 4.14 x10E6/uL (ref 3.77–5.28)
RDW: 13.2 % (ref 11.7–15.4)
WBC: 3.8 10*3/uL (ref 3.4–10.8)

## 2019-05-19 LAB — CMP14+EGFR
ALT: 59 IU/L — ABNORMAL HIGH (ref 0–32)
AST: 42 IU/L — ABNORMAL HIGH (ref 0–40)
Albumin/Globulin Ratio: 2 (ref 1.2–2.2)
Albumin: 4.1 g/dL (ref 3.8–4.8)
Alkaline Phosphatase: 103 IU/L (ref 39–117)
BUN/Creatinine Ratio: 8 — ABNORMAL LOW (ref 9–23)
BUN: 5 mg/dL — ABNORMAL LOW (ref 6–24)
Bilirubin Total: 0.2 mg/dL (ref 0.0–1.2)
CO2: 25 mmol/L (ref 20–29)
Calcium: 9.1 mg/dL (ref 8.7–10.2)
Chloride: 102 mmol/L (ref 96–106)
Creatinine, Ser: 0.62 mg/dL (ref 0.57–1.00)
GFR calc Af Amer: 125 mL/min/{1.73_m2} (ref >59)
GFR calc non Af Amer: 108 mL/min/{1.73_m2} (ref >59)
Globulin, Total: 2.1 g/dL (ref 1.5–4.5)
Glucose: 212 mg/dL — ABNORMAL HIGH (ref 65–99)
Potassium: 4.3 mmol/L (ref 3.5–5.2)
Sodium: 138 mmol/L (ref 134–144)
Total Protein: 6.2 g/dL (ref 6.0–8.5)

## 2019-05-21 ENCOUNTER — Ambulatory Visit: Payer: Self-pay

## 2019-05-26 ENCOUNTER — Telehealth (INDEPENDENT_AMBULATORY_CARE_PROVIDER_SITE_OTHER): Payer: Self-pay

## 2019-05-26 NOTE — Telephone Encounter (Signed)
-----   Message from Kerin Perna, NP sent at 05/20/2019  4:11 PM EDT ----- I have reviewed all labs and they are normal

## 2019-05-26 NOTE — Telephone Encounter (Signed)
Call placed to patient using pacific interpreter [phone]) patient is aware that all labs results are normal. Patient advised to take medications as directed and prescribed. Patient expressed understanding. Nat Christen, CMA

## 2019-05-31 ENCOUNTER — Other Ambulatory Visit: Payer: Self-pay

## 2019-05-31 ENCOUNTER — Ambulatory Visit: Payer: Self-pay

## 2019-07-05 ENCOUNTER — Ambulatory Visit: Payer: Self-pay | Attending: Internal Medicine | Admitting: Internal Medicine

## 2019-07-05 ENCOUNTER — Encounter: Payer: Self-pay | Admitting: Internal Medicine

## 2019-07-05 DIAGNOSIS — Z794 Long term (current) use of insulin: Secondary | ICD-10-CM | POA: Insufficient documentation

## 2019-07-05 DIAGNOSIS — R7989 Other specified abnormal findings of blood chemistry: Secondary | ICD-10-CM | POA: Insufficient documentation

## 2019-07-05 DIAGNOSIS — E119 Type 2 diabetes mellitus without complications: Secondary | ICD-10-CM

## 2019-07-05 DIAGNOSIS — R945 Abnormal results of liver function studies: Secondary | ICD-10-CM

## 2019-07-05 DIAGNOSIS — Z8719 Personal history of other diseases of the digestive system: Secondary | ICD-10-CM | POA: Insufficient documentation

## 2019-07-05 NOTE — Progress Notes (Signed)
Virtual Visit via Telephone Note Due to current restrictions/limitations of in-office visits due to the COVID-19 pandemic, this scheduled clinical appointment was converted to a telehealth visit  I connected with Christie Martinez on 07/05/19 at  8:50 AM EST by telephone and verified that I am speaking with the correct person using two identifiers. I am in my office.  The patient is at home.  Only the patient,myself and Ruby from [employer] [phone]) participated in this encounter.  I discussed the limitations, risks, security and privacy concerns of performing an evaluation and management service by telephone and the availability of in person appointments. I also discussed with the patient that there may be a patient responsible charge related to this service. The patient expressed understanding and agreed to proceed.   History of Present Illness: Patient with history of DM type II (dx 04/2019), diverticulitis with local perforation in the descending colon 04/2019.  Last seen by NP Edwards.  She requests change of PCP.  DM: checking BS in mornings before BF. Reports BS better.  Initially in the 200s.  Now 80s-120s.  This a.m BS was 121. Compliant with Metformin 1 gram BID, Lantus 15 units in a.m and Januvia 100 mg daily. Tolerating meds okay.  Some soreness where she does insulin inj.  No redness. -no DM teaching while in hosp in 04/2019 -walking and riding bike for about 30 mins a day -had blurred vision when first dx in 04/2019 but this is getting better.  No eye exam as yet.  -no numbness in hands and feet  Hx of recent Diverticulitis with local perforation:  No further abdominal pain. No nausea or vomiting.  Plan was to get her in with general surgeon post hosp but pt does not have insurance.  She has applied for [facility] discount card and was approved.  -LFTs noted to be elevated on last chem.  She does not drink ETOH beverages  HM: due for pap and tdap   Outpatient  Encounter Medications as of 07/05/2019  Medication Sig  . blood glucose meter kit and supplies KIT Dispense based on patient and insurance preference. Use up to four times daily as directed. (FOR ICD-9 250.00, 250.01).  . Insulin Glargine (LANTUS SOLOSTAR) 100 UNIT/ML Solostar Pen Inject 15 Units into the skin daily.  . Insulin Syringe-Needle U-100 (INSULIN SYRINGE .5CC/31GX5/16") 31G X 5/16" 0.5 ML MISC Levemir 15 units daily  . metFORMIN (GLUCOPHAGE) 1000 MG tablet Take 1 tablet (1,000 mg total) by mouth 2 (two) times daily with a meal.  . ondansetron (ZOFRAN) 4 MG tablet Take 1 tablet (4 mg total) by mouth every 6 (six) hours as needed for nausea.  . sitaGLIPtin (JANUVIA) 100 MG tablet Take 1 tablet (100 mg total) by mouth daily.  . traMADol (ULTRAM) 50 MG tablet Take 1 tablet (50 mg total) by mouth every 6 (six) hours as needed for severe pain.   No facility-administered encounter medications on file as of 07/05/2019.     Observations/Objective:   Chemistry      Component Value Date/Time   NA 138 05/18/2019 1151   K 4.3 05/18/2019 1151   CL 102 05/18/2019 1151   CO2 25 05/18/2019 1151   BUN 5 (L) 05/18/2019 1151   CREATININE 0.62 05/18/2019 1151      Component Value Date/Time   CALCIUM 9.1 05/18/2019 1151   ALKPHOS 103 05/18/2019 1151   AST 42 (H) 05/18/2019 1151   ALT 59 (H) 05/18/2019 1151   BILITOT 0.2 05/18/2019  1151     Lab Results  Component Value Date   WBC 3.8 05/18/2019   HGB 12.6 05/18/2019   HCT 37.7 05/18/2019   MCV 91 05/18/2019   PLT 275 05/18/2019   Lab Results  Component Value Date   HGBA1C 12.4 (H) 05/06/2019     Assessment and Plan: 1. Type 2 diabetes mellitus without complication, with long-term current use of insulin ([place]) -Reported blood sugars are good.  She will continue current dose of Lantus, Metformin and Januvia.  Dietary counseling given.  Advised to avoid sugary drinks, limit portion sizes of white carbohydrates, eat more lean white meat  than red meat, incorporate fresh fruits and vegetables into the diet.  Commended her on regular exercise and encouraged her to keep up the good work. -Advised to get an eye exam once a year.  Currently uninsured.  She will inquire into the cost of having eye exam at [facility]. - Microalbumin/Creatinine Ratio, Urine; Future - Lipid panel; Future - Amb ref to Medical Nutrition Therapy-MNT  2. History of diverticulitis - Ambulatory referral to General Surgery  3. Abnormal LFTs - Hepatic function panel; Future   Follow Up Instructions: 2 mths for pap   I discussed the assessment and treatment plan with the patient. The patient was provided an opportunity to ask questions and all were answered. The patient agreed with the plan and demonstrated an understanding of the instructions.   The patient was advised to call back or seek an in-person evaluation if the symptoms worsen or if the condition fails to improve as anticipated.  I provided 22 minutes of non-face-to-face time during this encounter.   Karle Plumber, MD

## 2019-07-06 ENCOUNTER — Other Ambulatory Visit: Payer: Self-pay | Admitting: Internal Medicine

## 2019-07-06 DIAGNOSIS — E1169 Type 2 diabetes mellitus with other specified complication: Secondary | ICD-10-CM

## 2019-07-06 DIAGNOSIS — E785 Hyperlipidemia, unspecified: Secondary | ICD-10-CM | POA: Insufficient documentation

## 2019-07-06 LAB — MICROALBUMIN / CREATININE URINE RATIO
Creatinine, Urine: 92.4 mg/dL
Microalb/Creat Ratio: 8 mg/g creat (ref 0–29)
Microalbumin, Urine: 7.8 ug/mL

## 2019-07-06 LAB — HEPATIC FUNCTION PANEL
ALT: 32 IU/L (ref 0–32)
AST: 29 IU/L (ref 0–40)
Albumin: 4.9 g/dL — ABNORMAL HIGH (ref 3.8–4.8)
Alkaline Phosphatase: 86 IU/L (ref 39–117)
Bilirubin Total: 0.5 mg/dL (ref 0.0–1.2)
Bilirubin, Direct: 0.12 mg/dL (ref 0.00–0.40)
Total Protein: 7.2 g/dL (ref 6.0–8.5)

## 2019-07-06 LAB — LIPID PANEL
Chol/HDL Ratio: 4.1 ratio (ref 0.0–4.4)
Cholesterol, Total: 203 mg/dL — ABNORMAL HIGH (ref 100–199)
HDL: 50 mg/dL (ref >39)
LDL Chol Calc (NIH): 133 mg/dL — ABNORMAL HIGH (ref 0–99)
Triglycerides: 109 mg/dL (ref 0–149)
VLDL Cholesterol Cal: 20 mg/dL (ref 5–40)

## 2019-07-06 MED ORDER — ATORVASTATIN CALCIUM 20 MG PO TABS: 20.0000 mg | ORAL_TABLET | Freq: Every day | ORAL | 3 refills | Status: DC

## 2019-07-15 ENCOUNTER — Ambulatory Visit: Payer: Self-pay | Admitting: General Surgery

## 2019-08-03 ENCOUNTER — Ambulatory Visit (INDEPENDENT_AMBULATORY_CARE_PROVIDER_SITE_OTHER): Payer: Self-pay | Admitting: General Surgery

## 2019-08-03 ENCOUNTER — Encounter: Payer: Self-pay | Admitting: General Surgery

## 2019-08-03 ENCOUNTER — Other Ambulatory Visit
Admission: RE | Admit: 2019-08-03 | Discharge: 2019-08-03 | Disposition: A | Discharge status: Home / Self Care | Payer: Self-pay | Source: Ambulatory Visit | Attending: General Surgery | Admitting: General Surgery

## 2019-08-03 VITALS — BP 121/84 | HR 73 | Temp 97.7°F | Ht 60.0 in | Wt 138.6 lb

## 2019-08-03 DIAGNOSIS — K5720 Diverticulitis of large intestine with perforation and abscess without bleeding: Secondary | ICD-10-CM

## 2019-08-03 DIAGNOSIS — R109 Unspecified abdominal pain: Secondary | ICD-10-CM | POA: Insufficient documentation

## 2019-08-03 LAB — CREATININE, SERUM
Creatinine, Ser: 0.52 mg/dL (ref 0.44–1.00)
GFR calc Af Amer: >60 mL/min (ref >60)
GFR calc non Af Amer: >60 mL/min (ref >60)

## 2019-08-03 NOTE — Progress Notes (Signed)
Patient ID: Christie Martinez, female   DOB: [birth year], 49 y.o.   MRN: [id number]  Chief Complaint  Patient presents with  . New Patient (Initial Visit)     new pt ref Dr.Deborah Wynetta Emery diverticulitis    HPI Christie Martinez is a 49 y.o. female.  She has been referred by her primary care provider for evaluation of diverticulitis.  Today's visit was held with the assistance of Spanish language interpreter, Christie Martinez.  In October 2020, Christie Martinez presented to her primary care provider at [facility] with left lower quadrant pain.  Blood work obtained at that time showed a glucose of over 500 and a CT scan performed showed perforated diverticulitis.  She was subsequently sent to the emergency department at [facility].  She was treated with antibiotics for the diverticulitis and placed on medication for her newly-diagnosed diabetes.  General surgery was consulted and at that time, operative intervention was deferred in favor of hopefully being able to perform a single step operation without ostomy.  Due to insurance reasons, she was unable to follow-up with the surgeons at [facility].  In the interim, she obtained coverage with an orange card and is now here today to discuss her surgical options.  At the time of her initial presentation, she had abdominal pain without fever or chills.  She was vomiting, but this may have been secondary to diabetic ketoacidosis.  She did not have any diarrhea.  Since completing her antibiotic treatment, she has not experienced any further episodes of a similar type of pain that brought her to the emergency department.  She currently has no fevers or chills.  No nausea or vomiting.  No diarrhea or constipation.  She feels like her diabetes is now under good control, but she has not yet had an eye exam or a repeat A1c.  She has never had a colonoscopy.  Looking back through the electronic medical record, she did have an episode of left lower quadrant  pain in 2018.  A CT scan done at that time did show uncomplicated sigmoid diverticulosis.  Unfortunately, I do not have access to the more recent October CT scan showing the perforation.  The report is available in the electronic medical record via the notes from [facility] and I have copied it here:  "CT urogram [facility] report, 05/05/2019: Clear lung base, small hiatal hernia, calcified right hepatic lobe granuloma appearing gallbladder pancreas spleen adrenals no renal masses calculus or obstructive uropathy normal urinary bladder, anteverted uterus with 4.5 mm fundal focal calcification, 2.5 cm right ovarian cyst, diverticulosis with increased attenuation within the distal descending paracolic fat and small amount of localized free air consistent with acute diverticulitis with focal perforation.  No abscess identified, normal appendix, normal abdominal aortic and iliac arteries.  No aggressive osseous lesions.   Impression:  acute diverticulitis with localized perforation at the level of the distal descending colon small hiatal hernia, calcified right hepatic lobe granuloma, 4.5 mm left fundal uterine calcification and 2.5 cm right ovarian cyst."    Past Medical History:  Diagnosis Date  . Diabetes mellitus without complication ([place])   . Hyperlipidemia     History reviewed. No pertinent surgical history.  Family History  Problem Relation Age of Onset  . Diabetes Father     Social History Social History   Tobacco Use  . Smoking status: Never Smoker  . Smokeless tobacco: Never Used  Substance Use Topics  . Alcohol use: No  .  Drug use: Never    No Known Allergies  Current Outpatient Medications  Medication Sig Dispense Refill  . atorvastatin (LIPITOR) 20 MG tablet Take 1 tablet (20 mg total) by mouth daily. 30 tablet 3  . insulin detemir (LEVEMIR) 100 UNIT/ML injection Inject 15 Units into the skin daily.    . Insulin Glargine (LANTUS SOLOSTAR) 100 UNIT/ML Solostar  Pen Inject 15 Units into the skin daily. 5 pen PRN  . Insulin Syringe-Needle U-100 (INSULIN SYRINGE .5CC/31GX5/16") 31G X 5/16" 0.5 ML MISC Levemir 15 units daily 100 each 0  . metFORMIN (GLUCOPHAGE) 1000 MG tablet Take 1 tablet (1,000 mg total) by mouth 2 (two) times daily with a meal. 60 tablet 3  . sitaGLIPtin (JANUVIA) 100 MG tablet Take 1 tablet (100 mg total) by mouth daily. 30 tablet 3  . blood glucose meter kit and supplies KIT Dispense based on patient and insurance preference. Use up to four times daily as directed. (FOR ICD-9 250.00, 250.01). 1 each 0  . ondansetron (ZOFRAN) 4 MG tablet Take 1 tablet (4 mg total) by mouth every 6 (six) hours as needed for nausea. (Patient not taking: Reported on 08/03/2019) 20 tablet 0  . traMADol (ULTRAM) 50 MG tablet Take 1 tablet (50 mg total) by mouth every 6 (six) hours as needed for severe pain. (Patient not taking: Reported on 08/03/2019) 20 tablet 0   No current facility-administered medications for this visit.    Review of Systems Review of Systems  All other systems reviewed and are negative. Or as discussed in the history of present illness.  Blood pressure 121/84, pulse 73, temperature 97.7 F (36.5 C), temperature source Temporal, height 5' (1.524 m), weight 138 lb 9.6 oz (62.9 kg), SpO2 98 %. Body mass index is 27.07 kg/m.  Physical Exam Physical Exam Constitutional:      General: She is not in acute distress.    Appearance: Normal appearance.  HENT:     Head: Normocephalic and atraumatic.     Nose:     Comments: Covered with a mask secondary to COVID-19 precautions    Mouth/Throat:     Comments: Covered with a mask secondary to COVID-19 precautions Eyes:     General: No scleral icterus.       Right eye: No discharge.        Left eye: No discharge.  Neck:     Comments: No palpable thyromegaly or dominant thyroid masses appreciated. Cardiovascular:     Rate and Rhythm: Normal rate and regular rhythm.     Pulses: Normal  pulses.     Heart sounds: No murmur.  Pulmonary:     Effort: Pulmonary effort is normal. No respiratory distress.     Breath sounds: Normal breath sounds.  Abdominal:     General: Bowel sounds are normal.     Palpations: Abdomen is soft.     Comments: Very mild tenderness to deep palpation in the left lower quadrant.  No rebound or guarding.  Genitourinary:    Comments: Deferred Musculoskeletal:        General: No deformity.     Cervical back: No rigidity.     Right lower leg: No edema.     Left lower leg: No edema.  Lymphadenopathy:     Cervical: No cervical adenopathy.  Skin:    General: Skin is warm and dry.  Neurological:     General: No focal deficit present.     Mental Status: She is alert and oriented to person,  place, and time.  Psychiatric:        Mood and Affect: Mood normal.        Behavior: Behavior normal.     Data Reviewed I reviewed her hospital course at [facility] in October.  The pertinent details are discussed in the history of present illness.  Assessment This is a 49 year old woman who has a history of diverticulosis dating back to at least 2018.  She had perforated diverticulitis in October of this year.  This was treated conservatively and she has recovered nicely.  Due to the history of perforation, partial colectomy is indicated.  Plan Due to COVID-19, we are currently unable to schedule operations that would require more than an overnight hospital admission.  In the interim, however, we will obtain a CT scan to confirm that the inflammation has resolved.  She will also need to undergo colonoscopy and we have placed a referral to gastroenterology for this.  I did discuss the surgical procedure with her today.  The risks of the operation include bleeding and infection, injury to surrounding structures, specifically the ureter as well as others.  I told her that I would have urology place a stent to aid in identification of the ureter during her operation.   There is a risk of anastomotic leak or breakdown. I cautioned her that there is a possibility she may require a temporary diverting ostomy if I had any questions about the integrity of her anastomosis.  She would like to proceed with surgery whenever we are able to do so.    Fredirick Maudlin 08/03/2019, 12:04 PM

## 2019-08-03 NOTE — Patient Instructions (Addendum)
Dr.Cannon discussed possible surgery with patient at today's visit. A referral was sent to Gastroenterology for patient to have a Colonoscopy. Patient is also scheduled for CT Abdomen/Pelvis with Contrast. Patient is to not have anything to eat or drink four hours prior to procedure.                                          Diverticulitis  La diverticulitis ocurre cuando pequeos bolsillos que se han formado en el intestino grueso (colon) se infectan o se inflaman. Esto produce dolor de Teaching laboratory technician y heces lquidas (diarrea). Estas bolsas en el colon se denominan divertculos. Se forman en las personas que tienen una afeccin llamada diverticulitis. Siga estas indicaciones en su casa: Medicamentos  [employer] de venta libre y los recetados solamente como se lo haya indicado el mdico. Estos incluyen los siguientes: ? Antibiticos. ? Analgsicos. ? Pastillas de Friendly. ? Probiticos. ? Laxantes.  No conduzca ni use maquinaria pesada mientras toma analgsicos recetados.  Si le recetaron un antibitico, tmelo como se lo hayan indicado. No deje de tomarlos aunque se sienta mejor. Instrucciones generales   Siga la dieta como se lo haya indicado el mdico.  Cuando se sienta mejor, el mdico puede indicarle que cambie la dieta. Tal vez necesite ingerir gran cantidad de fibra. La fibra facilita la evacuacin intestinal (defecacin). Entre los alimentos saludables con [place], se incluyen los siguientes: ? Frutos rojos. ? Frijoles. ? Lentejas. ? Verduras de [employer].  Haga ejercicios 3 o ms veces por semana. Hgalos durante 30 minutos cada vez. Ejerctese lo suficiente como para transpirar y Environmental education officer los latidos cardacos.  Concurra a todas las visitas de control como se lo hayan indicado. Esto es importante. Puede que tenga que someterse a un examen del intestino grueso. Esto se denomina colonoscopia. Comunquese con un mdico si:  El dolor no mejora.  Le cuesta mucho comer o  beber.  No defeca como lo hace normalmente. Solicite ayuda de inmediato si:  El Product/process development scientist.  Los problemas no mejoran.  Los problemas empeoran muy rpidamente.  Tiene fiebre.  Devuelve (vomita) ms de una vez.  Sus heces tienen las siguientes caractersticas: ? Paramedic. ? Son de color negro. ? Son alquitranadas. Resumen  La diverticulitis ocurre cuando pequeos bolsillos que se han formado en el intestino grueso (colon) se infectan o se inflaman.  Tome los medicamentos solamente como se lo haya indicado el mdico.  Siga la dieta como se lo haya indicado el mdico. Esta informacin no tiene Theme park manager el consejo del mdico. Asegrese de hacerle al mdico cualquier pregunta que tenga. Document Revised: 01/16/2017 Document Reviewed: 01/16/2017 Elsevier Patient Education  2020 [employer].

## 2019-08-11 ENCOUNTER — Ambulatory Visit
Admission: RE | Admit: 2019-08-11 | Discharge: 2019-08-11 | Disposition: A | Discharge status: Home / Self Care | Payer: Self-pay | Source: Ambulatory Visit | Attending: General Surgery | Admitting: General Surgery

## 2019-08-11 ENCOUNTER — Other Ambulatory Visit: Payer: Self-pay

## 2019-08-11 DIAGNOSIS — K5720 Diverticulitis of large intestine with perforation and abscess without bleeding: Secondary | ICD-10-CM | POA: Insufficient documentation

## 2019-08-11 MED ORDER — IOHEXOL 300 MG/ML  SOLN
100.0000 mL | Freq: Once | INTRAMUSCULAR | Status: AC | PRN
  Administered 2019-08-11: 100 mL via INTRAVENOUS

## 2019-08-12 ENCOUNTER — Encounter: Payer: Self-pay | Admitting: Gastroenterology

## 2019-08-12 ENCOUNTER — Ambulatory Visit: Payer: Self-pay | Admitting: Gastroenterology

## 2019-08-12 VITALS — BP 115/78 | HR 67 | Temp 98.2°F | Ht 60.0 in | Wt 137.6 lb

## 2019-08-12 DIAGNOSIS — Z8719 Personal history of other diseases of the digestive system: Secondary | ICD-10-CM

## 2019-08-12 NOTE — Progress Notes (Signed)
Christie Bellows MD, MRCP(U.K) [address]  [place]  [place], [place] [address]  Main: [phone]  Fax: [phone]   Gastroenterology Consultation  Referring Provider:     Fredirick Maudlin, MD Primary Care Physician:  Christie Martinez Primary Gastroenterologist:  Christie Martinez  Reason for Consultation:  Diverticulitis         HPI:   Christie Martinez is a 49 y.o. y/o female has been referred for diverticulitis requiring a colonoscopy.  Martinez October 2020 she was diagnosed with a perforated descending colon diverticulitis and sent to [facility].  Treated with antibiotics.  No surgery was performed.  08/11/2019: CT scan of the abdomen and pelvis with contrast: Mild sigmoid diverticulitis no free air or abscess or fistula.  She is here today with an interpreter she speaks only Christie Martinez.  Entire visit was conducted via an interpreter.  Since her episode of diverticulitis abdominal pain has almost resolved.  Presently she has no pain, no fever, no diarrhea.  No family history of colon cancer or polyps.  No prior colonoscopy.  Not on any blood thinners.  I discussed with her the results of her CT scan from yesterday and explained that it shows if at all mild inflammation.  Past Medical History:  Diagnosis Date  . Diabetes mellitus without complication (Christie Martinez)   . Hyperlipidemia     No past surgical history on file.  Prior to Admission medications   Medication Sig Start Date End Date Taking? Authorizing Provider  atorvastatin (LIPITOR) 20 MG tablet Take 1 tablet (20 mg total) by mouth daily. 07/06/19   Christie Pier, MD  blood glucose meter kit and supplies KIT Dispense based on patient and insurance preference. Use up to four times daily as directed. (FOR ICD-9 250.00, 250.01). 05/10/19   Christie August, MD  insulin detemir (LEVEMIR) 100 UNIT/ML injection Inject 15 Units into the skin daily.    [provider]  Insulin Glargine (LANTUS SOLOSTAR) 100 UNIT/ML  Solostar Pen Inject 15 Units into the skin daily. 05/18/19   Christie Perna, NP  Insulin Syringe-Needle U-100 (INSULIN SYRINGE .5CC/31GX5/16") 31G X 5/16" 0.5 ML MISC Levemir 15 units daily 05/10/19   Christie August, MD  metFORMIN (GLUCOPHAGE) 1000 MG tablet Take 1 tablet (1,000 mg total) by mouth 2 (two) times daily with a meal. 05/18/19   Christie Perna, NP  ondansetron (ZOFRAN) 4 MG tablet Take 1 tablet (4 mg total) by mouth every 6 (six) hours as needed for nausea. Patient not taking: Reported on 08/03/2019 05/10/19   Christie August, MD  sitaGLIPtin (JANUVIA) 100 MG tablet Take 1 tablet (100 mg total) by mouth daily. 05/18/19   Christie Perna, NP  traMADol (ULTRAM) 50 MG tablet Take 1 tablet (50 mg total) by mouth every 6 (six) hours as needed for severe pain. Patient not taking: Reported on 08/03/2019 05/10/19 05/09/20  Christie August, MD    Family History  Problem Relation Age of Onset  . Diabetes Father      Social History   Tobacco Use  . Smoking status: Never Smoker  . Smokeless tobacco: Never Used  Substance Use Topics  . Alcohol use: No  . Drug use: Never    Allergies as of 08/12/2019  . (No Known Allergies)    Review of Systems:    All systems reviewed and negative except where noted Martinez HPI.   Physical Exam:  There were no vitals taken for this visit. No LMP recorded. Patient  is premenopausal. Psych:  Alert and cooperative. Normal mood and affect. General:   Alert,  Well-developed, well-nourished, pleasant and cooperative Martinez NAD Head:  Normocephalic and atraumatic. Eyes:  Sclera clear, no icterus.   Conjunctiva pink. Mouth:  No deformity or lesions,oropharynx pink & moist. Neck:  Supple; no masses or thyromegaly. Lungs:  Respirations even and unlabored.  Clear throughout to auscultation.   No wheezes, crackles, or rhonchi. No acute distress. Heart:  Regular rate and rhythm; no murmurs, clicks, rubs, or gallops. Abdomen:  Normal bowel sounds.  No  bruits.  Soft, non-tender and non-distended without masses, hepatosplenomegaly or hernias noted.  No guarding or rebound tenderness.    Neurologic:  Alert and oriented x3;  grossly normal neurologically. Lymph Nodes:  No significant cervical adenopathy. Psych:  Alert and cooperative. Normal mood and affect.  Imaging Studies: CT Abdomen Pelvis W Contrast  Result Date: 08/11/2019 CLINICAL DATA:  Diverticulitis suspected, left lower quadrant pain since September EXAM: CT ABDOMEN AND PELVIS WITH CONTRAST TECHNIQUE: Multidetector CT imaging of the abdomen and pelvis was performed using the standard protocol following bolus administration of intravenous contrast. CONTRAST:  166m OMNIPAQUE IOHEXOL 300 MG/ML  SOLN COMPARISON:  11/26/2016 FINDINGS: Lower chest: Lung bases are clear. No signs of pleural or pericardial effusion. Hepatobiliary: Liver is normal. Biliary tree without signs of pericholecystic stranding or ductal dilation. Portal vein is patent. Pancreas: Pancreas is normal. Spleen: Spleen is normal. Adrenals/Urinary Tract: Adrenal glands are normal Symmetric enhancement of bilateral kidneys without signs of hydronephrosis or suspicious renal lesion. Urinary bladder is unremarkable. Stomach/Bowel: Pericolonic stranding adjacent to the sigmoid colon at the descending/sigmoid junction. No signs of abscess or fistula. No signs of free air. Mild distension of the appendix, filled with stool like material no periappendiceal stranding. Vascular/Lymphatic: Vascular structures Martinez the abdomen are patent. No sign of upper abdominal adenopathy. No sign of retroperitoneal adenopathy. No sign of pelvic adenopathy. Reproductive: CT appearance of uterus and adnexa is unremarkable. Other: No signs of free air. Musculoskeletal: No signs of acute bone finding or destructive bone process. IMPRESSION: 1. Question mild sigmoid diverticulitis. No free air, abscess or fistula. Electronically Signed   By: GZetta BillsM.D.    On: 08/11/2019 12:02    Assessment and Plan:   Kamiyah ASherrilee Gillesis a 49y.o. y/o female has been referred for a colonoscopy after an episode of perforated diverticulitis.  CT scan from 08/11/2019 shows possible mild sigmoid diverticulitis but she has absolutely no symptoms.  I want to discuss with Dr. CCeline Ahrand probably she does not require any more antibiotics at this point of time.  I will plan to perform a colonoscopy Martinez 4 to 5 weeks time to rule out any underlying lesions that can contribute to her diverticulitis.  I have discussed alternative options, risks & benefits,  which include, but are not limited to, bleeding, infection, perforation,respiratory complication & drug reaction.  The patient agrees with this plan & written consent will be obtained.     Follow up Martinez PRN  Dr KJonathon BellowsMD,MRCP(U.K)

## 2019-08-16 ENCOUNTER — Other Ambulatory Visit: Payer: Self-pay | Admitting: Pharmacist

## 2019-08-16 ENCOUNTER — Encounter: Payer: Self-pay | Admitting: Registered"

## 2019-08-16 ENCOUNTER — Encounter: Payer: Self-pay | Attending: Internal Medicine | Admitting: Registered"

## 2019-08-16 ENCOUNTER — Other Ambulatory Visit: Payer: Self-pay

## 2019-08-16 DIAGNOSIS — E119 Type 2 diabetes mellitus without complications: Secondary | ICD-10-CM | POA: Insufficient documentation

## 2019-08-16 DIAGNOSIS — Z794 Long term (current) use of insulin: Secondary | ICD-10-CM

## 2019-08-16 MED ORDER — GLUCOSE BLOOD VI STRP: ORAL_STRIP | 11 refills | Status: DC

## 2019-08-16 NOTE — Progress Notes (Signed)
Diabetes Self-Management Education  Visit Type:  First/Initial  Appt. Start Time: 8:00 Appt. End Time: 9:32  08/16/2019  Christie Martinez, identified by name and date of birth, is a 49 y.o. female with a diagnosis of Diabetes: Type 2.   ASSESSMENT  Pt expectations: wants to know about food  Pt states she was in the hospital in Oct. States she was having increased urination, increased thirst, blurred vision. States she did not know at the time that she had diabetes. States she was initially sad. States she has been afraid to eat things fearing it would cause her BS numbers to increase. States she's been eating vegetable soup or chicken soup, whole wheat bread, and brown rice. States she was told not to eat after 6:30 pm. States she has been experiencing some hair loss for the past 3 months. States she is still hungry at times and only eats small portions.   Reports checking BS 1-2x/day: FBS (106-121) and a few times later in the day when experiencing headaches (134).   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 States sometimes work can be stressful and cause headaches.   Reports being more intentional with activity by taking stairs instead of elevators and walks a lot.    There were no vitals taken for this visit. There is no height or weight on file to calculate BMI.   Diabetes Self-Management Education - 08/16/19 0805      Health Coping   How would you rate your overall health?  Fair      Psychosocial Assessment   Self-care barriers  English as a second language    Self-management support  Family    Patient Concerns  Nutrition/Meal planning    Special Needs  Simplified materials    Preferred Learning Style  Auditory;Visual    Learning Readiness  Ready      Complications   Last HgB A1C per patient/outside source  12.4 %    How often do you check your blood sugar?  1-2 times/day    Fasting Blood glucose range (mg/dL)  96-222    Postprandial Blood glucose range  (mg/dL)  979-892    Number of hypoglycemic episodes per month  2    Can you tell when your blood sugar is low?  Yes    What do you do if your blood sugar is low?  sit down, eat fruit, and drink water    Number of hyperglycemic episodes per week  1    Can you tell when your blood sugar is high?  No    Have you had a dilated eye exam in the past 12 months?  No    Have you had a dental exam in the past 12 months?  No    Are you checking your feet?  Yes    How many days per week are you checking your feet?  7      Dietary Intake   Breakfast  egg + small fruit + coffee    Lunch  chicken + brown rice    Dinner  sandwich wtih whole wheat bread + [place] ham + herbal tea    Beverage(s)  coffee, herbal tea, water (48-64 oz)      Exercise   Exercise Type  Light (walking / raking leaves)    How many days per week to you exercise?  3    How many minutes per day do you exercise?  20    Total  minutes per week of exercise  60      Patient Education   Previous Diabetes Education  No    Disease state   Definition of diabetes, type 1 and 2, and the diagnosis of diabetes;Factors that contribute to the development of diabetes    Nutrition management   Role of diet in the treatment of diabetes and the relationship between the three main macronutrients and blood glucose level;Food label reading, portion sizes and measuring food.;Reviewed blood glucose goals for pre and post meals and how to evaluate the patients' food intake on their blood glucose level.;Information on hints to eating out and maintain blood glucose control.    Physical activity and exercise   Role of exercise on diabetes management, blood pressure control and cardiac health.    Monitoring  Purpose and frequency of SMBG.;Taught/discussed recording of test results and interpretation of SMBG.;Identified appropriate SMBG and/or A1C goals.;Interpreting lab values - A1C, lipid, urine microalbumina.    Acute complications  Taught treatment of  hypoglycemia - the 15 rule.;Discussed and identified patients' treatment of hyperglycemia.    Chronic complications  Lipid levels, blood glucose control and heart disease    Psychosocial adjustment  Identified and addressed patients feelings and concerns about diabetes      Individualized Goals (developed by patient)   Nutrition  Follow meal plan discussed;General guidelines for healthy choices and portions discussed    Physical Activity  Exercise 3-5 times per week;30 minutes per day    Medications  take my medication as prescribed    Monitoring   test my blood glucose as discussed    Reducing Risk  examine blood glucose patterns;increase portions of nuts and seeds      Post-Education Assessment   Patient understands the diabetes disease and treatment process.  Demonstrates understanding / competency    Patient understands incorporating nutritional management into lifestyle.  Demonstrates understanding / competency    Patient undertands incorporating physical activity into lifestyle.  Needs Review    Patient understands using medications safely.  Demonstrates understanding / competency    Patient understands monitoring blood glucose, interpreting and using results  Needs Review    Patient understands prevention, detection, and treatment of acute complications.  Demonstrates understanding / competency    Patient understands prevention, detection, and treatment of chronic complications.  Needs Review    Patient understands how to develop strategies to address psychosocial issues.  Needs Review    Patient understands how to develop strategies to promote health/change behavior.  Needs Review      Outcomes   Program Status  Not Completed       Learning Objective:  Patient will have a greater understanding of diabetes self-management. Patient education plan is to attend individual and/or group sessions per assessed needs and concerns.   Plan:   Patient Instructions  Goals:  Follow  Diabetes Meal Plan as instructed  Eat 3 meals and 2 snacks, every 3-5 hrs  Make 1/2 plate non-starchy vegetables, 1/4 plate protein, and 1/4 plate carbohydrate/starch  Combine fruit with protein such as apple and nuts  Add lean protein foods to meals/snacks  Monitor glucose levels as instructed by your doctor  Aim for 30 mins of physical activity daily  Bring food record and glucose log to your next nutrition visit     Expected Outcomes:  Demonstrated interest in learning. Expect positive outcomes  Education material provided: ADA - How to Thrive: A Guide for Your Journey with Diabetes  If problems or questions, patient  to contact team via:  Phone and Email  Future DSME appointment: - 4-6 wks

## 2019-08-16 NOTE — Patient Instructions (Addendum)
Goals:  Follow Diabetes Meal Plan as instructed  Eat 3 meals and 2 snacks, every 3-5 hrs  Make 1/2 plate non-starchy vegetables, 1/4 plate protein, and 1/4 plate carbohydrate/starch  Combine fruit with protein such as apple and nuts  Add lean protein foods to meals/snacks  Monitor glucose levels as instructed by your doctor  Aim for 30 mins of physical activity daily  Bring food record and glucose log to your next nutrition visit

## 2019-08-18 ENCOUNTER — Ambulatory Visit (INDEPENDENT_AMBULATORY_CARE_PROVIDER_SITE_OTHER): Payer: Self-pay | Admitting: Primary Care

## 2019-09-07 ENCOUNTER — Telehealth: Payer: Self-pay

## 2019-09-07 NOTE — Telephone Encounter (Signed)
Spoke with pt using the interpreter line and informed her of the self pay price quotes for the colonoscopy as pt requested. I explained that the quote is the provider fee only, it does not include the hospital facility and anesthesia which are billed separately. Pt understands but wants to discuss this with her PCP first. Pt plans to contact our office if she decides to schedule.

## 2019-09-08 ENCOUNTER — Encounter: Payer: Self-pay | Attending: Internal Medicine | Admitting: Registered"

## 2019-09-08 ENCOUNTER — Other Ambulatory Visit: Payer: Self-pay

## 2019-09-08 ENCOUNTER — Encounter: Payer: Self-pay | Admitting: Registered"

## 2019-09-08 DIAGNOSIS — E119 Type 2 diabetes mellitus without complications: Secondary | ICD-10-CM | POA: Insufficient documentation

## 2019-09-08 NOTE — Patient Instructions (Signed)
Goals:  Follow Diabetes Meal Plan as instructed  Eat 3 meals and 2 snacks, every 3-5 hrs  Limit carbohydrate intake to 30-45 grams carbohydrate/meal  Limit carbohydrate intake to 15-30 grams carbohydrate/snack  Add lean protein foods to meals/snacks  Monitor glucose levels as instructed by your doctor  Aim for 30 mins of physical activity daily  Bring food record and glucose log to your next nutrition visit  Keep up the great work being active, balancing meals, and taking medications as prescribed!

## 2019-09-08 NOTE — Progress Notes (Signed)
Diabetes Self-Management Education  Visit Type:  Follow-up  Appt. Start Time: 8:06 Appt. End Time: 9:10  09/08/2019  Ms. Christie Martinez, identified by name and date of birth, is a 49 y.o. female with a diagnosis of Diabetes: Type 2.   ASSESSMENT  Pt states she has been checking BS 2x/day: FBS (95-105) and 2 hrs after eating (180). States she does a lot of walking while working, walks dog during the week at the park, and also has a stationary bike she rides at home 20 min, 4x/week.   There were no vitals taken for this visit. There is no height or weight on file to calculate BMI.   Diabetes Self-Management Education - 09/08/19 0807      Psychosocial Assessment   Self-care barriers  English as a second language    Special Needs  None    Preferred Learning Style  No preference indicated    Learning Readiness  Change in progress      Complications   How often do you check your blood sugar?  1-2 times/day    Fasting Blood glucose range (mg/dL)  70-129    Postprandial Blood glucose range (mg/dL)  180-200    Number of hypoglycemic episodes per month  0    Can you tell when your blood sugar is low?  Yes    What do you do if your blood sugar is low?  take glucose tablets    Number of hyperglycemic episodes per week  0    Can you tell when your blood sugar is high?  Yes    What do you do if your blood sugar is high?  unsure    Are you checking your feet?  Yes    How many days per week are you checking your feet?  7      Dietary Intake   Breakfast  scrambled eggs + cheese + tortilla + coffee    Lunch  3 cheese quesadillas with corn tortillas + green apple    Dinner  beef + broccoli + rice    Beverage(s)  coffee, water (3-4*16 oz bottles)      Exercise   Exercise Type  Light (walking / raking leaves)   walks alot while at work; works at [employer] many days per week to you exercise?  4    How many minutes per day do you exercise?  20    Total minutes per week of  exercise  80      Patient Education   Previous Diabetes Education  Yes (please comment)    Disease state   Definition of diabetes, type 1 and 2, and the diagnosis of diabetes;Factors that contribute to the development of diabetes    Nutrition management   Role of diet in the treatment of diabetes and the relationship between the three main macronutrients and blood glucose level;Reviewed blood glucose goals for pre and post meals and how to evaluate the patients' food intake on their blood glucose level.;Information on hints to eating out and maintain blood glucose control.    Physical activity and exercise   Role of exercise on diabetes management, blood pressure control and cardiac health.    Medications  Reviewed patients medication for diabetes, action, purpose, timing of dose and side effects.    Monitoring  Identified appropriate SMBG and/or A1C goals.;Daily foot exams;Yearly dilated eye exam    Acute complications  Taught treatment of hypoglycemia - the 15 rule.;Discussed and identified patients' treatment  of hyperglycemia.    Chronic complications  Relationship between chronic complications and blood glucose control;Assessed and discussed foot care and prevention of foot problems;Lipid levels, blood glucose control and heart disease;Identified and discussed with patient  current chronic complications;Dental care;Retinopathy and reason for yearly dilated eye exams;Nephropathy, what it is, prevention of, the use of ACE, ARB's and early detection of through urine microalbumia.;Reviewed with patient heart disease, higher risk of, and prevention    Psychosocial adjustment  Role of stress on diabetes;Helped patient identify a support system for diabetes management      Individualized Goals (developed by patient)   Nutrition  Follow meal plan discussed;General guidelines for healthy choices and portions discussed    Physical Activity  Exercise 5-7 days per week;30 minutes per day    Medications  take  my medication as prescribed    Monitoring   test my blood glucose as discussed    Reducing Risk  examine blood glucose patterns;do foot checks daily;treat hypoglycemia with 15 grams of carbs if blood glucose less than 70mg /dL;increase portions of nuts and seeds;increase portions of healthy fats      Post-Education Assessment   Patient understands the diabetes disease and treatment process.  Demonstrates understanding / competency    Patient understands incorporating nutritional management into lifestyle.  Demonstrates understanding / competency    Patient undertands incorporating physical activity into lifestyle.  Demonstrates understanding / competency    Patient understands using medications safely.  Demonstrates understanding / competency    Patient understands monitoring blood glucose, interpreting and using results  Demonstrates understanding / competency    Patient understands prevention, detection, and treatment of acute complications.  Demonstrates understanding / competency    Patient understands prevention, detection, and treatment of chronic complications.  Demonstrates understanding / competency    Patient understands how to develop strategies to address psychosocial issues.  Demonstrates understanding / competency    Patient understands how to develop strategies to promote health/change behavior.  Demonstrates understanding / competency      Outcomes   Program Status  Completed      Subsequent Visit   Since your last visit have you continued or begun to take your medications as prescribed?  Yes    Since your last visit have you had your blood pressure checked?  No    Since your last visit have you experienced any weight changes?  No change    Since your last visit, are you checking your blood glucose at least once a day?  Yes       Learning Objective:  Patient will have a greater understanding of diabetes self-management. Patient education plan is to attend individual and/or  group sessions per assessed needs and concerns.   Plan:   Patient Instructions  Goals:  Follow Diabetes Meal Plan as instructed  Eat 3 meals and 2 snacks, every 3-5 hrs  Limit carbohydrate intake to 30-45 grams carbohydrate/meal  Limit carbohydrate intake to 15-30 grams carbohydrate/snack  Add lean protein foods to meals/snacks  Monitor glucose levels as instructed by your doctor  Aim for 30 mins of physical activity daily  Bring food record and glucose log to your next nutrition visit  Keep up the great work being active, balancing meals, and taking medications as prescribed!     Expected Outcomes:  Demonstrated interest in learning. Expect positive outcomes  Education material provided: ADA - How to Thrive: A Guide for Your Journey with Diabetes and Support group flyer  If problems or questions, patient to  contact team via:  Phone and Email  Future DSME appointment: - Yearly

## 2019-10-26 ENCOUNTER — Other Ambulatory Visit (INDEPENDENT_AMBULATORY_CARE_PROVIDER_SITE_OTHER): Payer: Self-pay | Admitting: Primary Care

## 2019-10-27 NOTE — Telephone Encounter (Signed)
Sent to patients PCP to refill if appropriate.  

## 2019-12-10 ENCOUNTER — Other Ambulatory Visit: Payer: Self-pay

## 2019-12-10 ENCOUNTER — Ambulatory Visit: Payer: Self-pay | Attending: Internal Medicine

## 2019-12-13 ENCOUNTER — Other Ambulatory Visit: Payer: Self-pay | Admitting: Family

## 2019-12-13 ENCOUNTER — Ambulatory Visit: Payer: Self-pay | Attending: Family | Admitting: Family

## 2019-12-13 ENCOUNTER — Encounter: Payer: Self-pay | Admitting: Family

## 2019-12-13 ENCOUNTER — Other Ambulatory Visit: Payer: Self-pay

## 2019-12-13 VITALS — BP 126/80 | HR 70 | Temp 97.2°F | Resp 16 | Wt 136.4 lb

## 2019-12-13 DIAGNOSIS — E1169 Type 2 diabetes mellitus with other specified complication: Secondary | ICD-10-CM

## 2019-12-13 DIAGNOSIS — E119 Type 2 diabetes mellitus without complications: Secondary | ICD-10-CM

## 2019-12-13 DIAGNOSIS — Z789 Other specified health status: Secondary | ICD-10-CM

## 2019-12-13 DIAGNOSIS — E785 Hyperlipidemia, unspecified: Secondary | ICD-10-CM

## 2019-12-13 DIAGNOSIS — Z794 Long term (current) use of insulin: Secondary | ICD-10-CM

## 2019-12-13 LAB — POCT GLYCOSYLATED HEMOGLOBIN (HGB A1C): HbA1c POC (<> result, manual entry): 5.6 % (ref 4.0–5.6)

## 2019-12-13 LAB — GLUCOSE, POCT (MANUAL RESULT ENTRY): POC Glucose: 119 mg/dl — AB (ref 70–99)

## 2019-12-13 MED ORDER — ATORVASTATIN CALCIUM 20 MG PO TABS: 20.0000 mg | ORAL_TABLET | Freq: Every day | ORAL | 2 refills | Status: DC

## 2019-12-13 MED ORDER — SITAGLIPTIN PHOSPHATE 100 MG PO TABS: ORAL_TABLET | ORAL | 2 refills | Status: DC

## 2019-12-13 MED ORDER — INSULIN DETEMIR 100 UNIT/ML ~~LOC~~ SOLN: 15.0000 [IU] | Freq: Every day | SUBCUTANEOUS | 1 refills | Status: DC

## 2019-12-13 MED ORDER — METFORMIN HCL 1000 MG PO TABS: 1000.0000 mg | ORAL_TABLET | Freq: Two times a day (BID) | ORAL | 2 refills | Status: DC

## 2019-12-13 MED ORDER — "INSULIN SYRINGE 31G X 5/16"" 0.5 ML MISC": 0 refills | Status: DC

## 2019-12-13 NOTE — Progress Notes (Signed)
Patient ID: Arica Bevilacqua, female    DOB: 04-28-1971  MRN: [id number]  CC: Diabetes follow-up  Subjective: Ijeoma Sherrilee Gilles is a 49 y.o. female with history of diverticulitis of colon with perforation, type 2 diabetes mellitus without complication with long-term current use of insulin, hyperlipidemia associated with type 2 diabetes mellitus, and abnormal LFTs who presents for diabetes follow-up.   1. DIABETES TYPE 2 FOLLOW-UP: Last A1C:  5.6, May 2021 Med Adherence:  '[x]'$  Yes    '[]'$  No Medication side effects:  '[]'$  Yes    '[x]'$  No Home Monitoring?  '[x]'$  Yes    '[]'$  No Home glucose results range: 98 - 107 morning fasting values  Diet Adherence: '[x]'$  Yes    '[]'$  No  Exercise: '[x]'$  Yes    '[]'$  No Hypoglycemic episodes?: '[]'$  Yes    '[x]'$  No  Numbness of the feet? '[]'$  Yes    '[x]'$  No Retinopathy hx? '[]'$  Yes   '[x]'$  No Last eye exam: denies  Comments: Last visit December 2020 with Dr. Wynetta Emery. During that encounter Lantus, Metformin, and Januvia continued and dietary counseling given. Today patient reports she is not taking Lantus. States she is only taking Levemir.  2. HYPERLIPIDEMIA FOLLOW-UP:  Last Lipid Panel results:  HDL  Date Value Ref Range Status  07/05/2019 50 >39 mg/dL Final   Triglycerides  Date Value Ref Range Status  07/05/2019 109 0 - 149 mg/dL Final    Are you fasting today: '[]'$  Yes '[x]'$  No, had eggs, two tortillas, yogurt, and coffee for breakfast Med Adherence: '[x]'$  Yes    '[]'$  No Medication side effects: '[]'$  Yes    '[x]'$  No Muscle aches:  '[]'$  Yes    '[x]'$  No Diet Adherence: '[x]'$  Yes    '[]'$  No Comments: Last visit December 2020 with Dr. Wynetta Emery. During that encounter patient continued on Atorvastatin.   Patient Active Problem List   Diagnosis Date Noted  . Hyperlipidemia associated with type 2 diabetes mellitus ([place]) 07/06/2019  . Type 2 diabetes mellitus without complication, with long-term current use of insulin ([place]) 07/05/2019  . History of diverticulitis 07/05/2019  .  Abnormal LFTs 07/05/2019  . Diverticulitis of colon with perforation 05/06/2019     Current Outpatient Medications on File Prior to Visit  Medication Sig Dispense Refill  . atorvastatin (LIPITOR) 20 MG tablet Take 1 tablet (20 mg total) by mouth daily. 30 tablet 3  . blood glucose meter kit and supplies KIT Dispense based on patient and insurance preference. Use up to four times daily as directed. (FOR ICD-9 250.00, 250.01). 1 each 0  . glucose blood test strip Use as instructed to check blood sugar daily. 100 each 11  . insulin detemir (LEVEMIR) 100 UNIT/ML injection Inject 15 Units into the skin daily.    . Insulin Glargine (LANTUS SOLOSTAR) 100 UNIT/ML Solostar Pen Inject 15 Units into the skin daily. 5 pen PRN  . Insulin Syringe-Needle U-100 (INSULIN SYRINGE .5CC/31GX5/16") 31G X 5/16" 0.5 ML MISC Levemir 15 units daily 100 each 0  . JANUVIA 100 MG tablet TAKE 1 TABLET BY MOUTH DAILY. **NEED PROOF OF INCOME FOR PT. ASSISTANCE 30 tablet 3  . metFORMIN (GLUCOPHAGE) 1000 MG tablet Take 1 tablet (1,000 mg total) by mouth 2 (two) times daily with a meal. 60 tablet 3  . ondansetron (ZOFRAN) 4 MG tablet Take 1 tablet (4 mg total) by mouth every 6 (six) hours as needed for nausea. (Patient not taking: Reported on 08/03/2019) 20 tablet 0  .  traMADol (ULTRAM) 50 MG tablet Take 1 tablet (50 mg total) by mouth every 6 (six) hours as needed for severe pain. (Patient not taking: Reported on 08/03/2019) 20 tablet 0   No current facility-administered medications on file prior to visit.    No Known Allergies  Social History   Socioeconomic History  . Marital status: Single    Spouse name: Not on file  . Number of children: Not on file  . Years of education: Not on file  . Highest education level: Not on file  Occupational History  . Not on file  Tobacco Use  . Smoking status: Never Smoker  . Smokeless tobacco: Never Used  Substance and Sexual Activity  . Alcohol use: No  . Drug use: Never  .  Sexual activity: Yes  Other Topics Concern  . Not on file  Social History Narrative  . Not on file   Social Determinants of Health   Financial Resource Strain:   . Difficulty of Paying Living Expenses:   Food Insecurity:   . Worried About Charity fundraiser in the Last Year:   . Arboriculturist in the Last Year:   Transportation Needs:   . Film/video editor (Medical):   Marland Kitchen Lack of Transportation (Non-Medical):   Physical Activity:   . Days of Exercise per Week:   . Minutes of Exercise per Session:   Stress:   . Feeling of Stress :   Social Connections:   . Frequency of Communication with Friends and Family:   . Frequency of Social Gatherings with Friends and Family:   . Attends Religious Services:   . Active Member of Clubs or Organizations:   . Attends Archivist Meetings:   Marland Kitchen Marital Status:   Intimate Partner Violence:   . Fear of Current or Ex-Partner:   . Emotionally Abused:   Marland Kitchen Physically Abused:   . Sexually Abused:     Family History  Problem Relation Age of Onset  . Diabetes Father     No past surgical history on file.  ROS: Review of Systems Negative except as stated above  PHYSICAL EXAM: Vitals with BMI 12/13/2019 08/12/2019 08/03/2019  Height - '5\' 0"'$  '5\' 0"'$   Weight 136 lbs 6 oz 137 lbs 10 oz 138 lbs 10 oz  BMI - 44.96 75.91  Systolic 638 466 599  Diastolic 80 78 84  Pulse 70 67 73  SpO2- 99%, room air  Temperature- 97.2 F, oral  Physical Exam  General appearance - alert, well appearing, and in no distress and oriented to person, place, and time Mental status - alert, oriented to person, place, and time, normal mood, behavior, speech, dress, motor activity, and thought processes Eyes - pupils equal and reactive, extraocular eye movements intact Neck - supple, no significant adenopathy Lymphatics - no palpable lymphadenopathy, no hepatosplenomegaly Chest - clear to auscultation, no wheezes, rales or rhonchi, symmetric air entry, no  tachypnea, retractions or cyanosis Heart - normal rate, regular rhythm, normal S1, S2, no murmurs, rubs, clicks or gallops Neurological - alert, oriented, normal speech, no focal findings or movement disorder noted, neck supple without rigidity, cranial nerves II through XII intact, funduscopic exam normal, discs flat and sharp, DTR's normal and symmetric, motor and sensory grossly normal bilaterally, normal muscle tone, no tremors, strength 5/5, Romberg sign negative, normal gait and station Extremities - feet normal, good pulses, normal color, temperature and sensation, monofilament sensory exam is normal in both feet Musculoskeletal - no  joint tenderness, deformity or swelling, no muscular tenderness noted, full range of motion without pain Skin - normal coloration and turgor, no rashes, no suspicious skin lesions noted  Results for orders placed or performed in visit on 12/13/19  POCT glycosylated hemoglobin (Hb A1C)  Result Value Ref Range   Hemoglobin A1C     HbA1c POC (<> result, manual entry) 5.6 4.0 - 5.6 %   HbA1c, POC (prediabetic range)     HbA1c, POC (controlled diabetic range)    Glucose (CBG)  Result Value Ref Range   POC Glucose 119 (A) 70 - 99 mg/dl    ASSESSMENT AND PLAN: 1. Type 2 diabetes mellitus without complication, with long-term current use of insulin ([place]): -Patient's A1C within at goal during today's visit at 5.6.  Today's blood glucose 119 and patient has eaten breakfast prior to visit. -Continue Metormin, Januvia, and Levemir as prescribed. -To achieve an A1C goal of less than or equal to 7.0 percent, a fasting blood sugar of 80 to 130 mg/dL and a postprandial glucose (90 to 120 minutes after a meal) less than 180 mg/dL. In the event of sugars less than 60 mg/dl or greater than 400 mg/dl please notify the clinic ASAP. It is recommended that you undergo annual eye exams and annual foot exams. -Discussed the importance of healthy eating habits, low-carbohydrate  diet, low-sugar diet, regular aerobic exercise (at least 150 minutes a week as tolerated) and medication compliance to achieve or maintain control of diabetes. -CMP and CBC last obtained October 2020 and not indicted at this time. -Follow-up with primary physician in 3 months or sooner if needed. - POCT glycosylated hemoglobin (Hb A1C) - Glucose (CBG) - metFORMIN (GLUCOPHAGE) 1000 MG tablet; Take 1 tablet (1,000 mg total) by mouth 2 (two) times daily with a meal.  Dispense: 60 tablet; Refill: 2 - sitaGLIPtin (JANUVIA) 100 MG tablet; TAKE 1 TABLET BY MOUTH DAILY. **NEED PROOF OF INCOME FOR PT. ASSISTANCE  Dispense: 30 tablet; Refill: 2 - Insulin Syringe-Needle U-100 (INSULIN SYRINGE .5CC/31GX5/16") 31G X 5/16" 0.5 ML MISC; Levemir 15 units daily  Dispense: 100 each; Refill: 0 - insulin detemir (LEVEMIR) 100 UNIT/ML injection; Inject 0.15 mLs (15 Units total) into the skin daily.  Dispense: 10 mL; Refill: 1  2. Hyperlipidemia associated with type 2 diabetes mellitus ([place]): -Continue Atorvastatin as prescribed.  -Practice a low-fat diet and at least 150 minutes of moderate intensity exercise to assist with management of high cholesterol. -Lipid panel last obtained December 2020 and not indicated at this time. -Follow-up with primary provider in 3 months or sooner if needed. - atorvastatin (LIPITOR) 20 MG tablet; Take 1 tablet (20 mg total) by mouth daily.  Dispense: 30 tablet; Refill: 2  3. Language barrier: English as a second language teacher participated during this visit. Interpreter Name: Lavinia Sharps Identification #: [id number]  Patient was given the opportunity to ask questions.  Patient verbalized understanding of the plan and was able to repeat key elements of the plan. Patient was given clear instructions to go to Emergency Department or return to medical center if symptoms don't improve, worsen, or new problems develop.The patient verbalized understanding.   Camillia Herter, NP

## 2019-12-13 NOTE — Patient Instructions (Addendum)
Contine con Levemir, Metformin, Januvia y Metformin. Haga un seguimiento con el mdico de atencin primaria en 3 meses o antes si es necesario.  Continue Levemir, Metformin, Januvia, and Metformin. Follow-up with primary physician in 3 months or sooner if needed.   Informacin bsica sobre la diabetes Diabetes Basics  La diabetes (diabetes mellitus) es una enfermedad de larga duracin (crnica). Se produce cuando el cuerpo no utiliza Occupational hygienist (glucosa) que se libera de los alimentos despus de comer. La diabetes puede deberse a uno de [employer] o a ambos:  El pncreas no produce suficiente cantidad de una hormona llamada insulina.  El cuerpo no reacciona de forma normal a la insulina que produce. La insulina permite que ciertos azcares (glucosa) ingresen a las clulas del cuerpo. Esto le proporciona energa. Si tiene diabetes, los azcares no pueden ingresar a las clulas. Esto produce un aumento del nivel de Dispensing optician (hiperglucemia). Sigue estas instrucciones en tu casa: Cmo se trata la diabetes? Es posible que tenga que administrarse insulina u otros medicamentos para la diabetes todos los [place] para mantener el nivel de Location manager en la sangre equilibrado. Adminstrese los medicamentos para la diabetes todos los [place] se lo haya indicado el mdico. Haga una lista de los medicamentos para la diabetes aqu: Medicamentos para la diabetes  Nombre del medicamento: ______________________________ ? Cantidad (dosis): ________________ Mellody Drown (a.m./p.m.): _______________ Wilford Grist: ___________________________________  Micki Riley medicamento: ______________________________ ? Cantidad (dosis): ________________ Mellody Drown (a.m./p.m.): _______________ Wilford Grist: ___________________________________  Micki Riley medicamento: ______________________________ ? Cantidad (dosis): ________________ Mellody Drown (a.m./p.m.): _______________ Wilford Grist: ___________________________________ Si [place]  insulina, aprender cmo aplicrsela con inyecciones. Es posible que deba ajustar la cantidad en funcin de los alimentos que coma. Haga una lista de los tipos de [place] aqu: Insulina  Tipo de insulina: ______________________________ ? Cantidad (dosis): ________________ Mellody Drown (a.m./p.m.): _______________ Wilford Grist: ___________________________________  Suezanne Jacquet: ______________________________ ? Cantidad (dosis): ________________ Mellody Drown (a.m./p.m.): _______________ Wilford Grist: ___________________________________  Suezanne Jacquet: ______________________________ ? Cantidad (dosis): ________________ Mellody Drown (a.m./p.m.): _______________ Wilford Grist: ___________________________________  Suezanne Jacquet: ______________________________ ? Cantidad (dosis): ________________ Mellody Drown (a.m./p.m.): _______________ Wilford Grist: ___________________________________  Suezanne Jacquet: ______________________________ ? Cantidad (dosis): ________________ Mellody Drown (a.m./p.m.): _______________ Wilford Grist: ___________________________________ Cmo me controlo el nivel de azcar en la sangre?  Controle sus niveles de azcar en la sangre con un medidor de glucemia segn las indicaciones del mdico. El mdico fijar los objetivos del tratamiento para usted. Generalmente, los resultados de los niveles de azcar en la sangre deben ser los siguientes:  Antes de las comidas (preprandial): de 80 a 130mg /dl (de 4,4 a 7,43mmol/l).  Despus de las comidas (posprandial): por debajo de 180mg /dl (38mmol/l).  Nivel de A1c: menos del 7%. Anote las veces que se controlar los niveles de azcar en la sangre: Controles de azcar en la sangre  Hora: _______________ Wilford Grist: ___________________________________  Mellody Drown: _______________ Notas: ___________________________________  Hora: _______________ Notas: ___________________________________  Hora: _______________ Notas: ___________________________________  Hora: _______________  Notas: ___________________________________  Hora: _______________ Notas: ___________________________________  Sander Nephew debo saber acerca del nivel bajo de azcar en la sangre? Un nivel bajo de azcar en la sangre se denomina hipoglucemia. Este cuadro ocurre cuando el nivel de azcar en la sangre es igual o menor que 70mg /dl (3,87mmol/l). Entre los sntomas, se pueden incluir los siguientes:  Sentir: ? [place]. ? Preocupacin o nervios (ansiedad). ? Sudoracin y [employer]. ? Confusin. ? Mareos. ? Somnolencia. ? Ganas de vomitar (nuseas).  Tener: ? Latidos cardacos acelerados. ? Dolor de [place]. ? Cambios en la visin. ?  Hormigueo y falta de sensibilidad (entumecimiento) alrededor de la boca, los labios o la [place]. ? Movimientos espasmdicos que no puede controlar (convulsiones).  Dificultades para hacer lo siguiente: ? Moverse (coordinacin). ? [place]ormir. ? Desmayos. ? Molestarse con facilidad (irritabilidad). Tratamiento del nivel bajo de azcar en la sangre Para tratar un nivel bajo de azcar en la sangre, ingiera un alimento o una bebida azucarada de inmediato. Si puede pensar con claridad y tragar de manera segura, siga la regla 15/15, que consiste en lo siguiente:  Consuma 15gramos de un hidrato de carbono de accin rpida (carbohidrato). Hable con su mdico acerca de cunto debera consumir.  Algunos hidratos de carbono de accin rpida son: ? Comprimidos de azcar (pastillas de glucosa). Consuma 3o 4pastillas de glucosa. ? De 6 a 8unidades de caramelos duros. ? De 4 a 6onzas (de 120 a ) de jugo de frutas. ? De 4 a 6onzas (de 120 a ) de refresco comn (no diettico). ? 1 cucharada (25ml) de miel o azcar.  Contrlese el nivel de azcar en la sangre despus de ingerir el hidrato de carbono.  Si el nivel de azcar en la sangre todava es igual o menor que 70mg /dl ( ), ingiera nuevamente 15gramos de un hidrato de carbono.  Si el  nivel de azcar en la sangre no supera los 70mg /dl (7,3UKGU/R) despus de 3intentos, solicite ayuda de inmediato.  Ingiera una comida o una colacin en el transcurso de 1hora despus de que el nivel de azcar en la sangre se haya normalizado. Tratamiento del nivel muy bajo de azcar en la sangre Si el nivel de azcar en la sangre es igual o menor que 54mg /dl (73mmol/l), significa que est muy bajo (hipoglucemia grave). Esto es 4,2HCWC/B. No espere a ver si los sntomas desaparecen. Solicite atencin mdica de inmediato. Comunquese con el servicio de emergencias de su localidad (911 en los [place]). No conduzca por sus propios medios hospital. Preguntas para hacerle al mdico  Es necesario que me rena con 1m en el cuidado de la diabetes?  Qu equipos necesitar para cuidarme en casa?  Qu medicamentos para la diabetes necesito? Cundo debo tomarlos?  Con qu frecuencia debo controlar mi nivel de azcar en la sangre?  A qu nmero puedo llamar si tengo preguntas?  Cundo es mi prxima cita con el mdico?  Dnde puedo encontrar un grupo de apoyo para las personas con diabetes? Dnde buscar ms informacin  American Diabetes Association (Asociacin Estadounidense de la Diabetes): www.diabetes.org  American Association of Diabetes Educators (Asociacin Estadounidense de Instructores para el Cuidado de la Diabetes): www.diabeteseducator.org/patient-resources Comunquese con un mdico si:  El nivel de azcar en la sangre es igual o mayor que 240mg /dl (Radio broadcast assistant) durante 2das seguidos.  Ha estado enfermo o ha tenido fiebre durante 2das o ms y no mejora.  Tiene alguno de estos problemas durante ms de 6horas: ? No puede comer ni beber. ? Siente malestar estomacal (nuseas). ? [place]omita. ? Presenta heces lquidas (diarrea). Solicite ayuda inmediatamente si:  El nivel de azcar en la sangre est por debajo de 54mg /dl (1mmol/l).  Se  siente confundida.  Tiene dificultad para hacer lo siguiente: ? Pensar con claridad. ? La respiracin. Resumen  La diabetes (diabetes mellitus) es una enfermedad de larga duracin (crnica). Se produce cuando el cuerpo no utiliza IT trainer (glucosa) que se libera de los alimentos despus de la digestin.  Aplquese la insulina y tome los medicamentos para la diabetes como se lo hayan indicado.  Contrlese el nivel de azcar en la sangre todos los [place], con la frecuencia que le hayan indicado.  Concurra a todas las visitas de [address] se lo haya indicado el mdico. Esto es importante. Esta informacin no tiene Theme park manager el consejo del mdico. Asegrese de hacerle al mdico cualquier pregunta que tenga. Document Revised: 09/09/2018 Document Reviewed: 11/21/2017 Elsevier Patient Education  2020 [employer].

## 2020-03-16 ENCOUNTER — Ambulatory Visit: Payer: Self-pay | Attending: Internal Medicine | Admitting: Internal Medicine

## 2020-03-16 DIAGNOSIS — E785 Hyperlipidemia, unspecified: Secondary | ICD-10-CM

## 2020-03-16 DIAGNOSIS — Z23 Encounter for immunization: Secondary | ICD-10-CM

## 2020-03-16 DIAGNOSIS — E119 Type 2 diabetes mellitus without complications: Secondary | ICD-10-CM

## 2020-03-16 DIAGNOSIS — E1169 Type 2 diabetes mellitus with other specified complication: Secondary | ICD-10-CM

## 2020-03-16 DIAGNOSIS — Z794 Long term (current) use of insulin: Secondary | ICD-10-CM

## 2020-03-16 MED ORDER — INSULIN DETEMIR 100 UNIT/ML ~~LOC~~ SOLN: 10.0000 [IU] | Freq: Every day | SUBCUTANEOUS | 1 refills | Status: DC

## 2020-03-16 NOTE — Progress Notes (Signed)
Pt states her blood sugar this morning was 93

## 2020-03-16 NOTE — Progress Notes (Signed)
Virtual Visit via Telephone Note Due to current restrictions/limitations of in-office visits due to the COVID-19 pandemic, this scheduled clinical appointment was converted to a telehealth visit  I connected with Christie Martinez on 03/16/20 at 11:03 a.m by telephone and verified that I am speaking with the correct person using two identifiers. I am in my office.  The patient is at home.  Only the patient, myself and Hassell Done [phone]) from [employer] participated in this encounter.  I discussed the limitations, risks, security and privacy concerns of performing an evaluation and management service by telephone and the availability of in person appointments. I also discussed with the patient that there may be a patient responsible charge related to this service. The patient expressed understanding and agreed to proceed.  History of Present Illness: Patient with history of DM type II (dx 04/2019), HL, diverticulitis with local perforation in the descending colon 04/2019.  Last evaluated in December by me and in May of this year by my nurse practitioner.   DIABETES TYPE 2 Last A1C:   Results for orders placed or performed in visit on 12/13/19  POCT glycosylated hemoglobin (Hb A1C)  Result Value Ref Range   Hemoglobin A1C     HbA1c POC (<> result, manual entry) 5.6 4.0 - 5.6 %   HbA1c, POC (prediabetic range)     HbA1c, POC (controlled diabetic range)    Glucose (CBG)  Result Value Ref Range   POC Glucose 119 (A) 70 - 99 mg/dl    Med Adherence:  Stopped Levemir a few days ago due to frequent low BS - occurred about 3 x a wk.  She would feel shaky during these episodes.  She would eat something to bring BS back up.  Compliant with Metformin 1 gram BID  Medication side effects:  []  Yes    [x]  No Home Monitoring?  [x]  Yes  1-2 x a day Home glucose results range: lowest 87.  Highest is 110 Diet Adherence: [x]  Yes    []  No Exercise: [x]  Yes  -walks daily with her  dog Hypoglycemic episodes?: [x]  Yes    []  No Numbness of the feet? []  Yes    [x]  No Retinopathy hx? []  Yes    []  No Last eye exam: over due for eye exam.  No insurance Comments:   HL:  Taking and tolerating Lipitor  HM:  Completed COVID vaccine.  Due for flu shot and Tdap vaccine.  Due for PAP Outpatient Encounter Medications as of 03/16/2020  Medication Sig  . atorvastatin (LIPITOR) 20 MG tablet Take 1 tablet (20 mg total) by mouth daily.  . blood glucose meter kit and supplies KIT Dispense based on patient and insurance preference. Use up to four times daily as directed. (FOR ICD-9 250.00, 250.01).  Marland Kitchen glucose blood test strip Use as instructed to check blood sugar daily.  . insulin detemir (LEVEMIR) 100 UNIT/ML injection Inject 0.15 mLs (15 Units total) into the skin daily.  . Insulin Glargine (LANTUS SOLOSTAR) 100 UNIT/ML Solostar Pen Inject 15 Units into the skin daily.  . Insulin Syringe-Needle U-100 (INSULIN SYRINGE .5CC/31GX5/16") 31G X 5/16" 0.5 ML MISC Levemir 15 units daily  . metFORMIN (GLUCOPHAGE) 1000 MG tablet Take 1 tablet (1,000 mg total) by mouth 2 (two) times daily with a meal.  . ondansetron (ZOFRAN) 4 MG tablet Take 1 tablet (4 mg total) by mouth every 6 (six) hours as needed for nausea. (Patient not taking: Reported on 08/03/2019)  . sitaGLIPtin (JANUVIA) 100  MG tablet TAKE 1 TABLET BY MOUTH DAILY. **NEED PROOF OF INCOME FOR PT. ASSISTANCE  . traMADol (ULTRAM) 50 MG tablet Take 1 tablet (50 mg total) by mouth every 6 (six) hours as needed for severe pain. (Patient not taking: Reported on 08/03/2019)   No facility-administered encounter medications on file as of 03/16/2020.    Observations/Objective: Results for orders placed or performed in visit on 12/13/19  POCT glycosylated hemoglobin (Hb A1C)  Result Value Ref Range   Hemoglobin A1C     HbA1c POC (<> result, manual entry) 5.6 4.0 - 5.6 %   HbA1c, POC (prediabetic range)     HbA1c, POC (controlled diabetic range)     Glucose (CBG)  Result Value Ref Range   POC Glucose 119 (A) 70 - 99 mg/dl   Lab Results  Component Value Date   CHOL 203 (H) 07/05/2019   HDL 50 07/05/2019   LDLCALC 133 (H) 07/05/2019   TRIG 109 07/05/2019   CHOLHDL 4.1 07/05/2019     Assessment and Plan: 1. Type 2 diabetes mellitus without complication, with long-term current use of insulin ([facility]) Blood sugars at goal.  I recommend that she decrease the Levemir from 15 units daily to 10 units daily given hypoglycemic episodes.  If she finds that she continues to have the hypoglycemic episodes, she will let us know.  Continue Metformin and Januvia, healthy eating habits and regular exercise. - insulin detemir (LEVEMIR) 100 UNIT/ML injection; Inject 0.1 mLs (10 Units total) into the skin daily.  Dispense: 10 mL; Refill: 1  2. Hyperlipidemia associated with type 2 diabetes mellitus ([facility]CC) Continue atorvastatin.  3. Need for Tdap vaccination 4. Need for influenza vaccination She will come to see the clinical pharmacist to get the Tdap and flu vaccine.   Follow Up Instructions: 1 mth for pap   I discussed the assessment and treatment plan with the patient. The patient was provided an opportunity to ask questions and all were answered. The patient agreed with the plan and demonstrated an understanding of the instructions.   The patient was advised to call back or seek an in-person evaluation if the symptoms worsen or if the condition fails to improve as anticipated.  I provided 14 minutes of non-face-to-face time during this encounter.   Karle Plumber, MD

## 2020-03-30 ENCOUNTER — Other Ambulatory Visit: Payer: Self-pay

## 2020-03-30 ENCOUNTER — Ambulatory Visit: Payer: Self-pay | Attending: Internal Medicine | Admitting: Pharmacist

## 2020-03-30 DIAGNOSIS — Z23 Encounter for immunization: Secondary | ICD-10-CM

## 2020-03-30 NOTE — Progress Notes (Signed)
Patient presents for vaccination against influenza and tetanus per orders of Dr. Johnson. Consent given. Counseling provided. No contraindications exists. Vaccine administered without incident.   Luke Van Ausdall, PharmD, CPP Clinical Pharmacist [facility] [phone]  

## 2020-04-10 ENCOUNTER — Other Ambulatory Visit: Payer: Self-pay

## 2020-04-10 ENCOUNTER — Encounter: Payer: Self-pay | Admitting: Internal Medicine

## 2020-04-10 ENCOUNTER — Ambulatory Visit: Payer: Self-pay | Attending: Internal Medicine | Admitting: Family

## 2020-04-10 VITALS — BP 124/81 | HR 67 | Temp 98.3°F | Resp 16 | Wt 137.4 lb

## 2020-04-10 DIAGNOSIS — Z124 Encounter for screening for malignant neoplasm of cervix: Secondary | ICD-10-CM

## 2020-04-10 DIAGNOSIS — Z113 Encounter for screening for infections with a predominantly sexual mode of transmission: Secondary | ICD-10-CM

## 2020-04-10 DIAGNOSIS — Z1231 Encounter for screening mammogram for malignant neoplasm of breast: Secondary | ICD-10-CM

## 2020-04-10 DIAGNOSIS — Z789 Other specified health status: Secondary | ICD-10-CM

## 2020-04-10 NOTE — Progress Notes (Signed)
Patient ID: Christie Martinez, female    DOB: April 03, 1971  MRN: [id number]  CC: Gynecologic Exam  Subjective: Christie Martinez is a 49 y.o. female with history of diverticulitis of colon with perforation, type 2 diabetes mellitus without complication, with long-term current use of insulin, hyperlipidemia associated with type 2 diabetes mellitus, and abnormal LFT's who presents for PAP smear.  1. PAP SMEAR:  Age at menarche: 49 years old  LMP: October 2020, reports she did not have a period for 2 years prior to 2020 (being 2018) and then it returned October 2020 and has not had one since then Pregnancies: 3 Births: 3 Abortions: 0 Miscarriages: 0 Sexual concerns: denies Sexually active: yes Contraception use past, present: denies Previous STIs: denies Previous PID: denies Vaginal discharge: denies Vaginal lesions: denies Pelvic pain: denies Uterine fibroids: denies Urinary incontinence: denies Bowel incontinence: denies Testing for STIs today? yes Mammogram: denies and would like to get one   Patient Active Problem List   Diagnosis Date Noted  . Hyperlipidemia associated with type 2 diabetes mellitus ([place]) 07/06/2019  . Type 2 diabetes mellitus without complication, with long-term current use of insulin ([place]) 07/05/2019  . History of diverticulitis 07/05/2019  . Abnormal LFTs 07/05/2019  . Diverticulitis of colon with perforation 05/06/2019     Current Outpatient Medications on File Prior to Visit  Medication Sig Dispense Refill  . atorvastatin (LIPITOR) 20 MG tablet Take 1 tablet (20 mg total) by mouth daily. 30 tablet 2  . blood glucose meter kit and supplies KIT Dispense based on patient and insurance preference. Use up to four times daily as directed. (FOR ICD-9 250.00, 250.01). 1 each 0  . glucose blood test strip Use as instructed to check blood sugar daily. 100 each 11  . insulin detemir (LEVEMIR) 100 UNIT/ML injection Inject 0.1 mLs (10 Units total)  into the skin daily. 10 mL 1  . Insulin Syringe-Needle U-100 (INSULIN SYRINGE .5CC/31GX5/16") 31G X 5/16" 0.5 ML MISC Levemir 15 units daily 100 each 0  . metFORMIN (GLUCOPHAGE) 1000 MG tablet Take 1 tablet (1,000 mg total) by mouth 2 (two) times daily with a meal. 60 tablet 2  . sitaGLIPtin (JANUVIA) 100 MG tablet TAKE 1 TABLET BY MOUTH DAILY. **NEED PROOF OF INCOME FOR PT. ASSISTANCE 30 tablet 2   No current facility-administered medications on file prior to visit.    No Known Allergies  Social History   Socioeconomic History  . Marital status: Single    Spouse name: Not on file  . Number of children: Not on file  . Years of education: Not on file  . Highest education level: Not on file  Occupational History  . Not on file  Tobacco Use  . Smoking status: Never Smoker  . Smokeless tobacco: Never Used  Vaping Use  . Vaping Use: Never used  Substance and Sexual Activity  . Alcohol use: No  . Drug use: Never  . Sexual activity: Yes  Other Topics Concern  . Not on file  Social History Narrative  . Not on file   Social Determinants of Health   Financial Resource Strain:   . Difficulty of Paying Living Expenses: Not on file  Food Insecurity:   . Worried About Charity fundraiser in the Last Year: Not on file  . Ran Out of Food in the Last Year: Not on file  Transportation Needs:   . Lack of Transportation (Medical): Not on file  . Lack of Transportation (Non-Medical):  Not on file  Physical Activity:   . Days of Exercise per Week: Not on file  . Minutes of Exercise per Session: Not on file  Stress:   . Feeling of Stress : Not on file  Social Connections:   . Frequency of Communication with Friends and Family: Not on file  . Frequency of Social Gatherings with Friends and Family: Not on file  . Attends Religious Services: Not on file  . Active Member of Clubs or Organizations: Not on file  . Attends Archivist Meetings: Not on file  . Marital Status: Not on  file  Intimate Partner Violence:   . Fear of Current or Ex-Partner: Not on file  . Emotionally Abused: Not on file  . Physically Abused: Not on file  . Sexually Abused: Not on file    Family History  Problem Relation Age of Onset  . Diabetes Father     No past surgical history on file.  ROS: Review of Systems Negative except as stated above  PHYSICAL EXAM: BP 124/81   Pulse 67   Temp 98.3 F (36.8 C)   Resp 16   Wt 137 lb 6.4 oz (62.3 kg)   SpO2 99%   BMI 26.83 kg/m   Physical Exam General appearance - alert, well appearing, and in no distress and oriented to person, place, and time Mental status - alert, oriented to person, place, and time, normal mood, behavior, speech, dress, motor activity, and thought processes Chest - clear to auscultation, no wheezes, rales or rhonchi, symmetric air entry, no tachypnea, retractions or cyanosis Heart - normal rate, regular rhythm, normal S1, S2, no murmurs, rubs, clicks or gallops Breasts - patient declines to have breast exam Pelvic - normal external genitalia, vulva, vagina, cervix, uterus and adnexa, VULVA: normal appearing vulva with no masses, tenderness or lesions, VAGINA: normal appearing vagina with normal color and discharge, no lesions, DNA probe for chlamydia and GC obtained, CERVIX: normal appearing cervix without discharge or lesions, DNA probe for chlamydia and GC obtained, UTERUS: uterus is normal size, shape, consistency and nontender, ADNEXA: normal adnexa in size, nontender and no masses, PAP: Pap smear done today, thin-prep method, HPV test, exam chaperoned by Orlan Leavens, CMA  ASSESSMENT AND PLAN: 1. Cervical cancer screening: - Cytology PAP for cervical cancer screening. - Cytology - PAP  2. Routine screening for STI (sexually transmitted infection): - Cervicovaginal ancillary for screening of sexually transmitted infections. - Cervicovaginal ancillary only  3. Encounter for screening mammogram for malignant  neoplasm of breast: - Referral for mammogram for screening of malignant neoplasm of breast. - MM Digital Screening; Future  4. Language barrier: - [employer] participated during today's visit.  - Interpreter Name: Marianna Fuss, ID#: [id number]  Patient was given the opportunity to ask questions.  Patient verbalized understanding of the plan and was able to repeat key elements of the plan. Patient was given clear instructions to go to Emergency Department or return to medical center if symptoms don't improve, worsen, or new problems develop.The patient verbalized understanding.   Camillia Herter, NP

## 2020-04-10 NOTE — Patient Instructions (Addendum)
Please call the BCCCP (breast and cervical cancer control program) at [phone] to schedule diagnostic mammogram   PAP smear today. Referral for mammogram.   PAP hoy. Remisin para mamografa.   Prueba de Papanicolaou Pap Test Por qu me debo realizar esta prueba? La prueba de Papanicolaou, tambin denominada citologa vaginal, es una prueba de cribado para Engineer, manufacturing signos de:  Cncer de la vagina, del cuello uterino y del tero. El cuello uterino es la parte baja del tero que se abre hacia la vagina.  Infeccin.  Cambios que podran ser un signo de que se est desarrollando un cncer (cambios precancerosos). Las mujeres deben realizarse esta prueba con regularidad. En general, debe hacerse una prueba de Papanicolaou cada 3 aos hasta alcanzar la menopausia o hasta los 65 aos. Las [employer] 30 y 60 aos de edad pueden elegir realizarse la prueba de Papanicolaou al mismo tiempo que la prueba del VPH (virus del papiloma humano) cada 5 aos (en lugar de cada 3 aos). El mdico puede recomendarle que se realice pruebas de Papanicolaou con ms o menos frecuencia en funcin de sus afecciones mdicas y los resultados de la prueba de Papanicolaou anterior. Qu tipo de [place] se toma?  El mdico recolectar una muestra de clulas de la superficie del cuello uterino. Lo har utilizando un pequeo hisopo de algodn, una esptula de plstico o un cepillo. Esta muestra se recolecta durante un examen plvico, mientras usted est recostada boca arriba sobre la mesa de examen con los pies en los descansos para pies (estribos). En algunos casos, tambin pueden recolectarse fluidos (secreciones) del cuello uterino y la vagina. Cmo debo prepararme para esta prueba?  Tenga en cuenta en qu etapa del ciclo menstrual se encuentra. Es posible que se le pida que vuelva a Charity fundraiser la prueba si est Magazine features editor en que debe Futures trader.  Si el da en que debe realizarse la prueba tiene una  infeccin vaginal aparente, deber volver a Nurse, learning disability prueba.  Siga las indicaciones del mdico acerca de lo siguiente: ? Cambiar o suspender los medicamentos que toma habitualmente. Algunos medicamentos pueden [employer] de la prueba, como los digitlicos y Regulatory affairs officer. ? Evite las duchas vaginales o los baos de inmersin el da de la prueba o Medical laboratory scientific officer anterior. Informe al mdico acerca de lo siguiente:  Cualquier alergia que tenga.  Todos los [employer], incluidos vitaminas, hierbas, gotas oftlmicas, cremas y [address] de [address].  Cualquier enfermedad de la sangre que tenga.  Cirugas a las que se someti.  Cualquier afeccin mdica que tenga.  Si est embarazada o podra estarlo. Cmo se informan los resultados? Los [employer] de la prueba se informarn como anormales o normales. Puede producirse un resultado positivo falso. Este tipo de resultado es incorrecto porque indica que una enfermedad est presente cuando en realidad no lo est. Puede producirse un resultado negativo falso. Este tipo de resultado es incorrecto porque indica que una enfermedad no est presente cuando en realidad lo est. Qu significan los [place]? Un resultado normal en la prueba significa que no tiene signos de cncer de la vagina, del cuello uterino o del tero. Un resultado anormal puede significar que tiene:  Cncer. Una prueba de Papanicolaou por s sola no es suficiente para Consulting civil engineer. En este caso, se le realizarn ms pruebas.  Cambios precancerosos en la vagina, cuello uterino o tero.  Inflamacin del cuello uterino.  Enfermedades de transmisin sexual (ETS).  Infecciones por hongos.  Infecciones por parsitos.  Hable con su mdico sobre lo que significan sus [place]. Preguntas para hacerle al mdico Consulte a su mdico o pregunte en el departamento donde se realiza la prueba acerca de lo siguiente:  Cundo estarn  disponibles mis resultados?  Cmo obtendr mis resultados?  Cules son mis opciones de tratamiento?  Qu otras pruebas necesito?  Cules son los prximos pasos que debo seguir? Resumen  En general, las mujeres deben hacerse una prueba de Papanicolaou cada 3 aos Engineer, maintenance la menopausia o [employer] 65 aos de [place].  El mdico recolectar una muestra de clulas de la superficie del cuello uterino. Lo har utilizando un pequeo hisopo de algodn, una esptula de plstico o un cepillo.  En algunos casos, tambin pueden recolectarse fluidos (secreciones) del cuello uterino y la vagina. Esta informacin no tiene Theme park manager el consejo del mdico. Asegrese de hacerle al mdico cualquier pregunta que tenga. Document Revised: 06/24/2017 Document Reviewed: 06/24/2017 Elsevier Patient Education  2020 [employer].

## 2020-04-11 LAB — CERVICOVAGINAL ANCILLARY ONLY
Bacterial Vaginitis (gardnerella): NEGATIVE
Candida Glabrata: NEGATIVE
Candida Vaginitis: NEGATIVE
Chlamydia: NEGATIVE
Comment: NEGATIVE
Comment: NEGATIVE
Comment: NEGATIVE
Comment: NEGATIVE
Comment: NEGATIVE
Comment: NORMAL
Neisseria Gonorrhea: NEGATIVE
Trichomonas: NEGATIVE

## 2020-04-11 NOTE — Progress Notes (Signed)
No sexually transmitted infections. PAP results pending.

## 2020-04-13 LAB — CYTOLOGY - PAP
Comment: NEGATIVE
Diagnosis: NEGATIVE
High risk HPV: NEGATIVE

## 2020-04-13 NOTE — Progress Notes (Signed)
Please call patient with update.   PAP negative for malignancy and HPV negative.

## 2020-04-15 ENCOUNTER — Telehealth: Payer: Self-pay

## 2020-04-15 NOTE — Telephone Encounter (Signed)
[employer] Christie Martinez  Id# [id number]  contacted pt to go over lab results pt is aware and doesn't have any questions or concerns  °

## 2020-04-27 ENCOUNTER — Other Ambulatory Visit: Payer: Self-pay

## 2020-04-27 ENCOUNTER — Ambulatory Visit
Admission: RE | Admit: 2020-04-27 | Discharge: 2020-04-27 | Disposition: A | Discharge status: Home / Self Care | Payer: No Typology Code available for payment source | Source: Ambulatory Visit | Attending: Family | Admitting: Family

## 2020-04-27 DIAGNOSIS — Z1231 Encounter for screening mammogram for malignant neoplasm of breast: Secondary | ICD-10-CM

## 2020-05-03 ENCOUNTER — Telehealth: Payer: Self-pay

## 2020-05-03 NOTE — Telephone Encounter (Signed)
[employer] [place]  Id# [phone]  contacted pt to go over mm results pt is aware and doesn't have any questions or concerns

## 2020-05-03 NOTE — Progress Notes (Signed)
Please call patient with update.   No malignancy and repeat in 1 year.

## 2020-06-13 ENCOUNTER — Other Ambulatory Visit: Payer: Self-pay

## 2020-06-13 ENCOUNTER — Ambulatory Visit: Payer: Self-pay

## 2020-06-15 ENCOUNTER — Other Ambulatory Visit: Payer: Self-pay

## 2020-06-15 ENCOUNTER — Ambulatory Visit: Payer: Self-pay | Attending: Internal Medicine | Admitting: Internal Medicine

## 2020-06-15 ENCOUNTER — Other Ambulatory Visit: Payer: Self-pay | Admitting: Internal Medicine

## 2020-06-15 ENCOUNTER — Encounter: Payer: Self-pay | Admitting: Internal Medicine

## 2020-06-15 VITALS — BP 118/80 | HR 73 | Resp 16 | Wt 137.0 lb

## 2020-06-15 DIAGNOSIS — E785 Hyperlipidemia, unspecified: Secondary | ICD-10-CM

## 2020-06-15 DIAGNOSIS — E1169 Type 2 diabetes mellitus with other specified complication: Secondary | ICD-10-CM

## 2020-06-15 DIAGNOSIS — E119 Type 2 diabetes mellitus without complications: Secondary | ICD-10-CM

## 2020-06-15 DIAGNOSIS — G43009 Migraine without aura, not intractable, without status migrainosus: Secondary | ICD-10-CM | POA: Insufficient documentation

## 2020-06-15 DIAGNOSIS — Z794 Long term (current) use of insulin: Secondary | ICD-10-CM

## 2020-06-15 LAB — GLUCOSE, POCT (MANUAL RESULT ENTRY): POC Glucose: 87 mg/dl (ref 70–99)

## 2020-06-15 MED ORDER — SUMATRIPTAN SUCCINATE 50 MG PO TABS: ORAL_TABLET | ORAL | 1 refills | Status: DC

## 2020-06-15 MED ORDER — METFORMIN HCL 1000 MG PO TABS: 1000.0000 mg | ORAL_TABLET | Freq: Two times a day (BID) | ORAL | 2 refills | Status: DC

## 2020-06-15 MED ORDER — INSULIN DETEMIR 100 UNIT/ML ~~LOC~~ SOLN: 5.0000 [IU] | Freq: Every day | SUBCUTANEOUS | 1 refills | Status: DC

## 2020-06-15 MED ORDER — TOPIRAMATE 25 MG PO TABS: 25.0000 mg | ORAL_TABLET | Freq: Every day | ORAL | 1 refills | Status: DC

## 2020-06-15 MED ORDER — ATORVASTATIN CALCIUM 20 MG PO TABS: 20.0000 mg | ORAL_TABLET | Freq: Every day | ORAL | 2 refills | Status: DC

## 2020-06-15 MED ORDER — "INSULIN SYRINGE 31G X 5/16"" 0.5 ML MISC": 0 refills | Status: DC

## 2020-06-15 MED ORDER — SITAGLIPTIN PHOSPHATE 100 MG PO TABS: ORAL_TABLET | ORAL | 2 refills | Status: DC

## 2020-06-15 NOTE — Patient Instructions (Signed)
Cefalea migraosa Migraine Headache Una cefalea migraosa es un dolor muy intenso y punzante en uno o ambos lados de la cabeza. Este tipo de dolor de cabeza tambin puede causar otros sntomas. Puede durar desde 4horas hasta 3das. Hable con su mdico sobre las cosas que pueden causar (desencadenar) esta afeccin. Cules son las causas? Se desconoce la causa exacta de esta afeccin. Esta afeccin puede desencadenarse o ser causada por lo siguiente:  Consumo de alcohol.  Consumo de cigarrillos.  Tomar medicamentos como por ejemplo: ? Medicamentos para aliviar el dolor torcico (nitroglicerina). ? Anticonceptivos orales. ? Estrgeno. ? Algunos medicamentos para la presin arterial.  Comer o beber ciertos productos.  Hacer actividad fsica. Otros factores que pueden provocar cefalea migraosa son los siguientes:  Tener el perodo menstrual.  Embarazo.  Hambre.  Estrs.  No dormir lo suficiente o dormir demasiado.  Cambios climticos.  Cansancio (fatiga). Qu incrementa el riesgo?  Tener entre 25 y 55aos de edad.  Ser mujer.  Tener antecedentes familiares de cefalea migraosa.  Ser de raza caucsica.  Tener depresin o ansiedad.  Tener mucho sobrepeso. Cules son los signos o los sntomas?  Un dolor punzante. Este dolor puede tener las siguientes caractersticas: ? Puede aparecer en cualquier regin de la cabeza, tanto de un lado como de ambos. ? Puede dificultar las actividades cotidianas. ? Puede empeorar con la actividad fsica. ? Puede empeorar con las luces brillantes o los ruidos fuertes.  Otros sntomas pueden incluir: ? Ganas de vomitar (nuseas). ? Vmitos. ? Mareos. ? Sensibilidad a las luces brillantes, los ruidos fuertes o los olores.  Antes de tener una cefalea migraosa, puede recibir seales de advertencia (aura). Un aura puede incluir: ? Ver luces intermitentes o tener puntos ciegos. ? Ver puntos brillantes, halos o lneas en  zigzag. ? Tener una visin en tnel o visin borrosa. ? Sentir entumecimiento u hormigueo. ? Tener dificultad para hablar. ? Tener msculos dbiles.  Algunas personas tienen sntomas despus de una cefalea migraosa (fase posdromal), como los siguientes: ? Cansancio. ? Dificultad para pensar (concentrarse). Cmo se trata?  Tomar medicamentos para: ? Aliviar el dolor. ? Aliviar la sensacin de malestar estomacal. ? Prevenir las cefaleas migraosas.  El tratamiento tambin puede incluir lo siguiente: ? Tomar sesiones de acupuntura. ? Evitar los alimentos que provocan las cefaleas migraosas. ? Aprender maneras de controlar las funciones corporales (biorretroalimentacin). ? [place]apia para ayudarlo a conocer y lidiar con los pensamientos negativos (terapia cognitivo conductual). Siga estas instrucciones en su casa: Medicamentos  Tome los medicamentos de venta libre y los recetados solamente como se lo haya indicado el mdico.  Consulte a su mdico si el medicamento que le recetaron: ? Hace que sea necesario que evite conducir o usar maquinaria pesada. ? Puede causarle dificultad para defecar (estreimiento). Es posible que deba tomar estas medidas para prevenir o tratar los problemas para defecar:  Beber suficiente lquido para mantener el pis (la orina) de color amarillo plido.  Tomar medicamentos recetados o de venta libre.  Comer alimentos ricos en fibra. Entre ellos, frijoles, cereales integrales y frutas y verduras frescas.  Limitar los alimentos con alto contenido de grasa y azcar. Estos incluyen alimentos fritos o dulces. Estilo de vida  No beba alcohol.  No consuma ningn producto que contenga nicotina o tabaco, como cigarrillos, cigarrillos electrnicos y tabaco de mascar. Si necesita ayuda para dejar de fumar, consulte al mdico.  Duerma como mnimo 8horas todas las noches.  Limite el estrs y manjelo. Indicaciones generales        Lleve un registro diario  para averiguar lo que puede provocar las cefaleas migraosas. Registre, por ejemplo, lo siguiente: ? Lo que usted come y bebe. ? El tiempo que duerme. ? Algn cambio en lo que come o bebe. ? Algn cambio en sus medicamentos.  Si tiene una cefalea migraosa: ? Evite los factores que empeoren los sntomas, como las luces brillantes. ? Resulta til acostarse en una habitacin oscura y silenciosa. ? No conduzca vehculos ni opere maquinaria pesada. ? Pregntele al mdico qu actividades son seguras para usted.  Concurra a todas las visitas de seguimiento como se lo haya indicado el mdico. Esto es importante. Comunquese con un mdico si:  Tiene una cefalea migraosa que es diferente o peor que otras que ha tenido.  Tiene ms de 15 das de cefalea por mes. Solicite ayuda inmediatamente si:  La cefalea migraosa empeora mucho.  La cefalea migraosa dura ms de 72 horas.  Tiene fiebre.  Presenta rigidez en el cuello.  Tiene dificultad para ver.  Siente debilidad en los msculos o que no puede controlarlos.  Comienza a perder el equilibrio continuamente.  Comienza a tener dificultad para caminar.  Pierde el conocimiento (se desmaya).  Tiene una convulsin. Resumen  Una cefalea migraosa es un dolor muy intenso y punzante en uno o ambos lados de la cabeza. Estos dolores de cabeza tambin pueden causar otros sntomas.  Esta afeccin puede tratarse con medicamentos y cambios en el estilo de vida.  Lleve un registro diario para averiguar lo que puede provocar las cefaleas migraosas.  Comunquese con un mdico si tiene una cefalea migraosa que es diferente o peor que otras que ha tenido.  Comunquese con el mdico si tiene ms de 15 das de cefalea en un mes. Esta informacin no tiene como fin reemplazar el consejo del mdico. Asegrese de hacerle al mdico cualquier pregunta que tenga. Document Revised: 09/25/2018 Document Reviewed: 09/25/2018 Elsevier Patient Education   2020 Elsevier Inc.  

## 2020-06-15 NOTE — Progress Notes (Signed)
Patient ID: Christie Martinez, female    DOB: May 04, 1971  MRN: [id number]  CC: Diabetes and Headache   Subjective: Christie Martinez is a 49 y.o. female who presents for chronic ds management Her concerns today include:  Patient with history of DM type II(dx 04/2019), HL, diverticulitis with local perforation in the descending colon 04/2019.  DIABETES TYPE 2 Last A1C:   Results for orders placed or performed in visit on 06/15/20  POCT glucose (manual entry)  Result Value Ref Range   POC Glucose 87 70 - 99 mg/dl    Med Adherence:  _0  Yes -Metformin BID, Januvia and Levemir 5 units Medication side effects:  _1  Yes    _2  No Home Monitoring?  1-2 x a day Home glucose results range: Before BF range 99-120 Diet Adherence: doing well.  Snack on fruits.  Exercise: _3  Yes -does a lot of walking at work and also walks her dog 3-4 x/wk Hypoglycemic episodes?: _4  Yes -no recent events Numbness of the feet? _5  Yes    _6  No Retinopathy hx? _7  Yes    _8  No Last eye exam: over due for eye exam.  Plans to call and schedule at [facility] Comments:   HL:  compliant with Lipitor  C/o intermittent headaches that start 2 wks ago.  Feels it more on top of scalp. Occurs 3 x/wk; comes on gradualy; last a few hrs Endorses N/V (no abdomen pain), photophobia and phenophobia. Sometimes she gets redness in LT eye. No blurred vision Initiating factors include stress.  Feels better laying down Resolves a few hrs  after taking 1-2 Advil Similar type HA a few yrs ago.  Seen at [place] feeling intermittent twitching below LT lower eye lid.  Lasts few seconds.  Does not occur every day.  Drinks 1 cup decaf coffee in mornings  HM:  Completed COVID-19 vaccine series in May  Patient Active Problem List   Diagnosis Date Noted  . Hyperlipidemia associated with type 2 diabetes mellitus ([place]) 07/06/2019  . Type 2 diabetes mellitus without complication, with long-term current use of insulin  ([place]) 07/05/2019  . History of diverticulitis 07/05/2019  . Abnormal LFTs 07/05/2019  . Diverticulitis of colon with perforation 05/06/2019     Current Outpatient Medications on File Prior to Visit  Medication Sig Dispense Refill  . blood glucose meter kit and supplies KIT Dispense based on patient and insurance preference. Use up to four times daily as directed. (FOR ICD-9 250.00, 250.01). 1 each 0  . glucose blood test strip Use as instructed to check blood sugar daily. 100 each 11   No current facility-administered medications on file prior to visit.    No Known Allergies  Social History   Socioeconomic History  . Marital status: Single    Spouse name: Not on file  . Number of children: Not on file  . Years of education: Not on file  . Highest education level: Not on file  Occupational History  . Not on file  Tobacco Use  . Smoking status: Never Smoker  . Smokeless tobacco: Never Used  Vaping Use  . Vaping Use: Never used  Substance and Sexual Activity  . Alcohol use: No  . Drug use: Never  . Sexual activity: Yes  Other Topics Concern  . Not on file  Social History Narrative  . Not on file   Social Determinants of Health   Financial Resource Strain:   . Difficulty of Paying Living Expenses: Not  on file  Food Insecurity:   . Worried About Charity fundraiser in the Last Year: Not on file  . Ran Out of Food in the Last Year: Not on file  Transportation Needs:   . Lack of Transportation (Medical): Not on file  . Lack of Transportation (Non-Medical): Not on file  Physical Activity:   . Days of Exercise per Week: Not on file  . Minutes of Exercise per Session: Not on file  Stress:   . Feeling of Stress : Not on file  Social Connections:   . Frequency of Communication with Friends and Family: Not on file  . Frequency of Social Gatherings with Friends and Family: Not on file  . Attends Religious Services: Not on file  . Active Member of Clubs or Organizations:  Not on file  . Attends Archivist Meetings: Not on file  . Marital Status: Not on file  Intimate Partner Violence:   . Fear of Current or Ex-Partner: Not on file  . Emotionally Abused: Not on file  . Physically Abused: Not on file  . Sexually Abused: Not on file    Family History  Problem Relation Age of Onset  . Diabetes Father     No past surgical history on file.  ROS: Review of Systems Negative except as stated above  PHYSICAL EXAM: BP 118/80   Pulse 73   Resp 16   Wt 137 lb (62.1 kg)   LMP 11/12/2016   SpO2 99%   BMI 26.76 kg/m   Physical Exam  General appearance - alert, well appearing, and in no distress Mental status - normal mood, behavior, speech, dress, motor activity, and thought processes Neck - supple, no significant adenopathy Chest - clear to auscultation, no wheezes, rales or rhonchi, symmetric air entry Heart - normal rate, regular rhythm, normal S1, S2, no murmurs, rubs, clicks or gallops Neurological - cranial nerves II through XII intact, motor and sensory grossly normal bilaterally, Romberg sign negative, normal gait and station Extremities - peripheral pulses normal, no pedal edema, no clubbing or cyanosis   CMP Latest Ref Rng & Units 08/03/2019 07/05/2019 05/18/2019  Glucose 65 - 99 mg/dL - - 212(H)  BUN 6 - 24 mg/dL - - 5(L)  Creatinine 0.44 - 1.00 mg/dL 0.52 - 0.62  Sodium 134 - 144 mmol/L - - 138  Potassium 3.5 - 5.2 mmol/L - - 4.3  Chloride 96 - 106 mmol/L - - 102  CO2 20 - 29 mmol/L - - 25  Calcium 8.7 - 10.2 mg/dL - - 9.1  Total Protein 6.0 - 8.5 g/dL - 7.2 6.2  Total Bilirubin 0.0 - 1.2 mg/dL - 0.5 0.2  Alkaline Phos 39 - 117 IU/L - 86 103  AST 0 - 40 IU/L - 29 42(H)  ALT 0 - 32 IU/L - 32 59(H)   Lipid Panel     Component Value Date/Time   CHOL 203 (H) 07/05/2019 1331   TRIG 109 07/05/2019 1331   HDL 50 07/05/2019 1331   CHOLHDL 4.1 07/05/2019 1331   LDLCALC 133 (H) 07/05/2019 1331    CBC    Component Value  Date/Time   WBC 3.8 05/18/2019 1151   WBC 4.6 05/09/2019 0415   RBC 4.14 05/18/2019 1151   RBC 3.75 (L) 05/09/2019 0415   HGB 12.6 05/18/2019 1151   HCT 37.7 05/18/2019 1151   PLT 275 05/18/2019 1151   MCV 91 05/18/2019 1151   MCH 30.4 05/18/2019 1151   MCH  30.7 05/09/2019 0415   MCHC 33.4 05/18/2019 1151   MCHC 34.2 05/09/2019 0415   RDW 13.2 05/18/2019 1151   LYMPHSABS 1.4 05/18/2019 1151   MONOABS 0.4 05/09/2019 0415   EOSABS 0.0 05/18/2019 1151   BASOSABS 0.0 05/18/2019 1151    ASSESSMENT AND PLAN: 1. Type 2 diabetes mellitus without complication, with long-term current use of insulin ([place]) Reported blood sugars are at goal.  Continue current medications, healthy eating habits and regular exercise. A1c to be checked today via blood draw. 2. Hyperlipidemia associated with type 2 diabetes mellitus (HCC) Continue atorvastatin.  3. Migraine without aura and without status migrainosus, not intractable Discussed diagnosis of migraines.  Discussed management of migraines.  We will start her on Topamax for prophylaxis.  Advised patient that the medication can sometimes cause numbness and tingling in the extremities.  If she experiences any of that she should stop the medication and let us know.  We will use Imitrex for abortive therapy.  I went over how to use Imitrex.  Follow-up in 6 weeks to see how she is doing.      Patient was given the opportunity to ask questions.  Patient verbalized understanding of the plan and was able to repeat key elements of the plan.  [employer] interpreter used during this encounter. #[id number]  Orders Placed This Encounter  Procedures  . CBC  . Comprehensive metabolic panel  . Lipid panel  . Hemoglobin A1c  . POCT glucose (manual entry)     Requested Prescriptions   Signed Prescriptions Disp Refills  . metFORMIN (GLUCOPHAGE) 1000 MG tablet 60 tablet 2    Sig: Take 1 tablet (1,000 mg total) by mouth 2 (two) times daily with a meal.  .  atorvastatin (LIPITOR) 20 MG tablet 30 tablet 2    Sig: Take 1 tablet (20 mg total) by mouth daily.  . insulin detemir (LEVEMIR) 100 UNIT/ML injection 10 mL 1    Sig: Inject 0.05 mLs (5 Units total) into the skin daily.  . sitaGLIPtin (JANUVIA) 100 MG tablet 30 tablet 2    Sig: TAKE 1 TABLET BY MOUTH DAILY. **NEED PROOF OF INCOME FOR PT. ASSISTANCE  . Insulin Syringe-Needle U-100 (INSULIN SYRINGE .5CC/31GX5/16") 31G X 5/16" 0.5 ML MISC 100 each 0    Sig: Levemir 15 units daily  . topiramate (TOPAMAX) 25 MG tablet 30 tablet 1    Sig: Take 1 tablet (25 mg total) by mouth at bedtime.  . SUMAtriptan (IMITREX) 50 MG tablet 10 tablet 1    Sig: 1 tab Po at start of headache PRN.  May repeat x 1 in 2 hrs if Headache persist.  Max 2 tabs/24 hr    Return in about 6 weeks (around 07/27/2020).  Karle Plumber, MD, FACP

## 2020-06-16 ENCOUNTER — Other Ambulatory Visit: Payer: Self-pay | Admitting: Pharmacist

## 2020-06-16 DIAGNOSIS — E119 Type 2 diabetes mellitus without complications: Secondary | ICD-10-CM

## 2020-06-16 DIAGNOSIS — Z794 Long term (current) use of insulin: Secondary | ICD-10-CM

## 2020-06-16 LAB — COMPREHENSIVE METABOLIC PANEL
ALT: 19 IU/L (ref 0–32)
AST: 20 IU/L (ref 0–40)
Albumin/Globulin Ratio: 2.3 — ABNORMAL HIGH (ref 1.2–2.2)
Albumin: 4.8 g/dL (ref 3.8–4.8)
Alkaline Phosphatase: 83 IU/L (ref 44–121)
BUN/Creatinine Ratio: 17 (ref 9–23)
BUN: 11 mg/dL (ref 6–24)
Bilirubin Total: 0.6 mg/dL (ref 0.0–1.2)
CO2: 23 mmol/L (ref 20–29)
Calcium: 9.6 mg/dL (ref 8.7–10.2)
Chloride: 101 mmol/L (ref 96–106)
Creatinine, Ser: 0.64 mg/dL (ref 0.57–1.00)
GFR calc Af Amer: 123 mL/min/{1.73_m2} (ref >59)
GFR calc non Af Amer: 107 mL/min/{1.73_m2} (ref >59)
Globulin, Total: 2.1 g/dL (ref 1.5–4.5)
Glucose: 92 mg/dL (ref 65–99)
Potassium: 4.1 mmol/L (ref 3.5–5.2)
Sodium: 138 mmol/L (ref 134–144)
Total Protein: 6.9 g/dL (ref 6.0–8.5)

## 2020-06-16 LAB — LIPID PANEL
Chol/HDL Ratio: 3.1 ratio (ref 0.0–4.4)
Cholesterol, Total: 168 mg/dL (ref 100–199)
HDL: 55 mg/dL (ref >39)
LDL Chol Calc (NIH): 100 mg/dL — ABNORMAL HIGH (ref 0–99)
Triglycerides: 69 mg/dL (ref 0–149)
VLDL Cholesterol Cal: 13 mg/dL (ref 5–40)

## 2020-06-16 MED ORDER — "INSULIN SYRINGE 31G X 5/16"" 0.5 ML MISC": 2 refills | Status: AC

## 2020-06-18 NOTE — Progress Notes (Signed)
Let patient know that her kidney function is normal.Liver function tests normal.LDL cholesterol is 100 with goal being less than 70.  Please confirm that she is taking the atorvastatin 20 mg daily consistently.  If she has not been taking it consistently encourage her to do so.  If she has been taking it consistently then we will need to increase the dose to 40 mg daily.  Please let me know.

## 2020-06-19 ENCOUNTER — Telehealth: Payer: Self-pay | Admitting: Internal Medicine

## 2020-06-19 NOTE — Telephone Encounter (Signed)
Pt called asking Dr. Laural Benes to call whenever possible to go over results.

## 2020-06-20 NOTE — Telephone Encounter (Signed)
Called pt made aware/ Stated she skips sometimes. Please refer to notes in Epic

## 2020-06-26 ENCOUNTER — Other Ambulatory Visit: Payer: Self-pay | Admitting: Internal Medicine

## 2020-06-26 ENCOUNTER — Telehealth: Payer: Self-pay | Admitting: Internal Medicine

## 2020-06-26 DIAGNOSIS — E119 Type 2 diabetes mellitus without complications: Secondary | ICD-10-CM

## 2020-06-26 MED ORDER — GLUCOSE BLOOD VI STRP: ORAL_STRIP | 11 refills | Status: DC

## 2020-06-26 NOTE — Telephone Encounter (Signed)
Patient is calling because she has questions regarding "the blue card". It has community health and wellness on the card.  Patient states every time she goes to the pharmacy to pick up the medications the pharmacy tells her that she needs to fill out an application. The patient was asked by the pharmacy how much money that she makes? She told them $14.00per hour. They said that they would write down $53,000.  The patient stated that was incorrect that she make $29,000 a year. Patient was told that the patient needs a new application.  Patient wants to know the next time that she goes to the pharmacy does she need to show her taxes? Patient did complete a new application and she did receive her medication.  Patient paid $50 for her medication. Cb- [phone]  Patient is needed medication today and wants to be sure that she does not need to fill out a new application.

## 2020-06-26 NOTE — Telephone Encounter (Signed)
Medication: glucose blood test strip [[id number]]   Has the patient contacted their pharmacy? YES  (Agent: If no, request that the patient contact the pharmacy for the refill.) (Agent: If yes, when and what did the pharmacy advise?)  Preferred Pharmacy (with phone number or street name): [facility] - [place], [place] - [address]  [address], [place] [address]  Phone:  [phone] Fax:  [phone]   Agent: Please be advised that RX refills may take up to 3 business days. We ask that you follow-up with your pharmacy.

## 2020-06-26 NOTE — Telephone Encounter (Signed)
Requested Prescriptions  Pending Prescriptions Disp Refills  . glucose blood test strip 100 each 11    Sig: Use as instructed to check blood sugar daily.     Endocrinology: Diabetes - Testing Supplies Passed - 06/26/2020  9:07 AM      Passed - Valid encounter within last 12 months    Recent Outpatient Visits          1 week ago Type 2 diabetes mellitus without complication, with long-term current use of insulin (HCC)   [place] [facility] Marcine Matar, MD   2 months ago Cervical cancer screening   [facility] [place], [place], NP   2 months ago Need for influenza vaccination   [facility] Drucilla Chalet, RPH-CPP   3 months ago Type 2 diabetes mellitus without complication, with long-term current use of insulin [facility])   [place] [facility] Jonah Blue B, MD   6 months ago Type 2 diabetes mellitus without complication, with long-term current use of insulin [facility])   [facility] [place], [place], NP

## 2020-06-28 NOTE — Telephone Encounter (Signed)
I do not have any updated financials.  We have been asking her for them and she keeps saying you have them, but all I have been able to find are financials that are too old.  We have stopped helping her with patient assistance at this point because we are not getting what we need from her.  She said that we would get income info after her appt with you on 11/16....Marland KitchenMarland Kitchen

## 2020-06-28 NOTE — Telephone Encounter (Signed)
Disregard previous note-I do have 2020 taxes on file, just the first 2 pages

## 2020-06-29 ENCOUNTER — Other Ambulatory Visit: Payer: Self-pay | Admitting: Pharmacist

## 2020-06-29 ENCOUNTER — Other Ambulatory Visit: Payer: Self-pay | Admitting: Internal Medicine

## 2020-06-29 MED ORDER — TRUEPLUS LANCETS 28G MISC: 2 refills | Status: DC

## 2020-08-01 ENCOUNTER — Ambulatory Visit
Admission: EM | Admit: 2020-08-01 | Discharge: 2020-08-01 | Disposition: A | Discharge status: Home / Self Care | Payer: No Typology Code available for payment source | Attending: Emergency Medicine | Admitting: Emergency Medicine

## 2020-08-01 ENCOUNTER — Other Ambulatory Visit: Payer: Self-pay | Admitting: Emergency Medicine

## 2020-08-01 ENCOUNTER — Other Ambulatory Visit: Payer: Self-pay

## 2020-08-01 DIAGNOSIS — Z20822 Contact with and (suspected) exposure to covid-19: Secondary | ICD-10-CM

## 2020-08-01 DIAGNOSIS — J069 Acute upper respiratory infection, unspecified: Secondary | ICD-10-CM

## 2020-08-01 MED ORDER — CETIRIZINE HCL 10 MG PO CAPS: 10.0000 mg | ORAL_CAPSULE | Freq: Every day | ORAL | 0 refills | Status: DC

## 2020-08-01 MED ORDER — BENZONATATE 200 MG PO CAPS: 200.0000 mg | ORAL_CAPSULE | Freq: Three times a day (TID) | ORAL | 0 refills | Status: AC | PRN

## 2020-08-01 NOTE — ED Provider Notes (Signed)
[facility]    CSN: [id number] Arrival date & time: 08/01/20  1207      History   Chief Complaint No chief complaint on file. Fever, Cough  HPI Christie Martinez is a 50 y.o. female history of DM type II, hyperlipidemia presenting today for evaluation of URI symptoms and fever.  Reports for the past 3 days she has had cough congestion and sore throat.  Reports associated fevers.  She has not been taking over-the-counter medicine as she is concerned about elevating her sugars.  Reports her blood sugar this morning was 101.  Husband and son here with similar symptoms.  No known Covid exposure.  HPI  Past Medical History:  Diagnosis Date  . Diabetes mellitus without complication ([place])   . Hyperlipidemia     Patient Active Problem List   Diagnosis Date Noted  . Migraine without aura and without status migrainosus, not intractable 06/15/2020  . Hyperlipidemia associated with type 2 diabetes mellitus ([place]) 07/06/2019  . Type 2 diabetes mellitus without complication, with long-term current use of insulin ([place]) 07/05/2019  . History of diverticulitis 07/05/2019  . Abnormal LFTs 07/05/2019  . Diverticulitis of colon with perforation 05/06/2019    History reviewed. No pertinent surgical history.  OB History    Gravida  3   Para  3   Term  3   Preterm      AB      Living  3     SAB      IAB      Ectopic      Multiple      Live Births           Obstetric Comments  Menstrual age: 59  Age 1st Pregnancy: 64          Home Medications    Prior to Admission medications   Medication Sig Start Date End Date Taking? Authorizing Provider  benzonatate (TESSALON) 200 MG capsule Take 1 capsule (200 mg total) by mouth 3 (three) times daily as needed for up to 7 days for cough. 08/01/20 08/08/20 Yes Wieters, Hallie C, PA-C  Cetirizine HCl 10 MG CAPS Take 1 capsule (10 mg total) by mouth daily for 10 days. 08/01/20 08/11/20 Yes Wieters, Hallie C, PA-C   metFORMIN (GLUCOPHAGE) 1000 MG tablet Take 1 tablet (1,000 mg total) by mouth 2 (two) times daily with a meal. 06/15/20  Yes Ladell Pier, MD  atorvastatin (LIPITOR) 20 MG tablet Take 1 tablet (20 mg total) by mouth daily. 06/15/20   Ladell Pier, MD  blood glucose meter kit and supplies KIT Dispense based on patient and insurance preference. Use up to four times daily as directed. (FOR ICD-9 250.00, 250.01). 05/10/19   Aline August, MD  glucose blood test strip Use as instructed to check blood sugar daily. 06/26/20   Ladell Pier, MD  insulin detemir (LEVEMIR) 100 UNIT/ML injection Inject 0.05 mLs (5 Units total) into the skin daily. 06/15/20   Ladell Pier, MD  Insulin Syringe-Needle U-100 (INSULIN SYRINGE .5CC/31GX5/16") 31G X 5/16" 0.5 ML MISC Use to inject Levemir 15 units daily. 06/16/20   Ladell Pier, MD  sitaGLIPtin (JANUVIA) 100 MG tablet TAKE 1 TABLET BY MOUTH DAILY. **NEED PROOF OF INCOME FOR PT. ASSISTANCE 06/15/20   Ladell Pier, MD  SUMAtriptan (IMITREX) 50 MG tablet 1 tab Po at start of headache PRN.  May repeat x 1 in 2 hrs if Headache persist.  Max 2 tabs/24 hr  06/15/20   Ladell Pier, MD  topiramate (TOPAMAX) 25 MG tablet Take 1 tablet (25 mg total) by mouth at bedtime. 06/15/20   Ladell Pier, MD  TRUEplus Lancets 28G MISC Use to check blood sugar TID. 06/29/20   Ladell Pier, MD    Family History Family History  Problem Relation Age of Onset  . Diabetes Father     Social History Social History   Tobacco Use  . Smoking status: Never Smoker  . Smokeless tobacco: Never Used  Vaping Use  . Vaping Use: Never used  Substance Use Topics  . Alcohol use: No  . Drug use: Never     Allergies   Patient has no known allergies.   Review of Systems Review of Systems  Constitutional: Positive for fever. Negative for activity change, appetite change, chills and fatigue.  HENT: Positive for congestion. Negative for ear  pain, rhinorrhea, sinus pressure and trouble swallowing.   Eyes: Negative for discharge and redness.  Respiratory: Positive for cough. Negative for chest tightness and shortness of breath.   Cardiovascular: Negative for chest pain.  Gastrointestinal: Negative for abdominal pain, diarrhea, nausea and vomiting.  Musculoskeletal: Negative for myalgias.  Skin: Negative for rash.  Neurological: Negative for dizziness, light-headedness and headaches.     Physical Exam Triage Vital Signs ED Triage Vitals  Enc Vitals Group     BP 08/01/20 1509 118/75     Pulse Rate 08/01/20 1509 87     Resp 08/01/20 1509 16     Temp 08/01/20 1509 98.4 F (36.9 C)     Temp Source 08/01/20 1509 Oral     SpO2 08/01/20 1509 97 %     Weight --      Height --      Head Circumference --      Peak Flow --      Pain Score 08/01/20 1506 4     Pain Loc --      Pain Edu? --      Excl. in [place]? --    No data found.  Updated Vital Signs BP 118/75 (BP Location: Left Arm)   Pulse 87   Temp 98.4 F (36.9 C) (Oral)   Resp 16   LMP 11/12/2016   SpO2 97%   Visual Acuity Right Eye Distance:   Left Eye Distance:   Bilateral Distance:    Right Eye Near:   Left Eye Near:    Bilateral Near:     Physical Exam Vitals and nursing note reviewed.  Constitutional:      Appearance: She is well-developed and well-nourished.     Comments: No acute distress  HENT:     Head: Normocephalic and atraumatic.     Ears:     Comments: Bilateral ears without tenderness to palpation of external auricle, tragus and mastoid, EAC's without erythema or swelling, TM's with good bony landmarks and cone of light. Non erythematous.     Nose: Nose normal.     Mouth/Throat:     Comments: Oral mucosa pink and moist, no tonsillar enlargement or exudate. Posterior pharynx patent and nonerythematous, no uvula deviation or swelling. Normal phonation. Eyes:     Conjunctiva/sclera: Conjunctivae normal.  Cardiovascular:     Rate and  Rhythm: Normal rate and regular rhythm.  Pulmonary:     Effort: Pulmonary effort is normal. No respiratory distress.     Comments: Breathing comfortably at rest, CTABL, no wheezing, rales or other adventitious sounds auscultated Abdominal:  General: There is no distension.  Musculoskeletal:        General: Normal range of motion.     Cervical back: Neck supple.  Skin:    General: Skin is warm and dry.  Neurological:     Mental Status: She is alert and oriented to person, place, and time.  Psychiatric:        Mood and Affect: Mood and affect normal.      UC Treatments / Results  Labs (all labs ordered are listed, but only abnormal results are displayed) Labs Reviewed  COVID-19, FLU A+B NAA    EKG   Radiology No results found.  Procedures Procedures (including critical care time)  Medications Ordered in UC Medications - No data to display  Initial Impression / Assessment and Plan / UC Course  I have reviewed the triage vital signs and the nursing notes.  Pertinent labs & imaging results that were available during my care of the patient were reviewed by me and considered in my medical decision making (see chart for details).     Covid test pending, exam reassuring, recommend symptomatic and supportive care suspect viral etiology.  Tessalon and daily antihistamine to further help with cough and congestion.  Rest and fluids.  Discussed strict return precautions. Patient verbalized understanding and is agreeable with plan.  Final Clinical Impressions(s) / UC Diagnoses   Final diagnoses:  Encounter for screening laboratory testing for COVID-19 virus  Viral URI with cough     Discharge Instructions     Los resultado del COVID regrese in [place] 2-4 dias, solamente lammar si es positivo Toma tessalon cada 8 horas para tos Cetirizine diara para ayuda con congestion Tylenol y ibuprofen si necesita para dolor de [place], fiebre, dolor del cuerpo Regrese si no mejoran  o tiene mas dificuldad de respiracion     ED Prescriptions    Medication Sig Dispense Auth. Provider   benzonatate (TESSALON) 200 MG capsule Take 1 capsule (200 mg total) by mouth 3 (three) times daily as needed for up to 7 days for cough. 28 capsule Wieters, Hallie C, PA-C   Cetirizine HCl 10 MG CAPS Take 1 capsule (10 mg total) by mouth daily for 10 days. 10 capsule Wieters, [place] C, PA-C     PDMP not reviewed this encounter.   Janith Lima, [place] 08/01/20 1619

## 2020-08-01 NOTE — Discharge Instructions (Signed)
Los resultado del COVID regrese in [place] 2-4 dias, solamente lammar si es positivo Toma tessalon cada 8 horas para tos Cetirizine diara para ayuda con congestion Tylenol y ibuprofen si necesita para dolor de [place], fiebre, dolor del cuerpo Regrese si no mejoran o tiene mas dificuldad de respiracion

## 2020-08-01 NOTE — ED Triage Notes (Signed)
Pt c/o fever x 3 days and a sore throat. Pt states she feels chest and nasal congestion. Pt states she started to cough this morning. Pt states she is diabetic and does not know what medications to take to relieve the sxs.

## 2020-08-04 LAB — COVID-19, FLU A+B NAA
Influenza A, NAA: NOT DETECTED
Influenza B, NAA: NOT DETECTED
SARS-CoV-2, NAA: DETECTED — AB

## 2020-11-27 ENCOUNTER — Other Ambulatory Visit: Payer: Self-pay

## 2020-12-01 ENCOUNTER — Other Ambulatory Visit: Payer: Self-pay

## 2020-12-07 ENCOUNTER — Other Ambulatory Visit: Payer: Self-pay

## 2020-12-07 MED FILL — Metformin HCl Tab 1000 MG: ORAL | 30 days supply | Qty: 60 | Fill #0 | Status: AC

## 2020-12-11 ENCOUNTER — Other Ambulatory Visit: Payer: Self-pay

## 2021-02-09 ENCOUNTER — Other Ambulatory Visit: Payer: Self-pay

## 2021-04-27 ENCOUNTER — Encounter: Payer: Self-pay | Admitting: General Surgery

## 2021-05-07 ENCOUNTER — Other Ambulatory Visit: Payer: Self-pay

## 2021-05-07 ENCOUNTER — Ambulatory Visit: Payer: Self-pay | Attending: Internal Medicine | Admitting: Internal Medicine

## 2021-05-07 VITALS — BP 118/80 | HR 72 | Resp 16 | Wt 147.2 lb

## 2021-05-07 DIAGNOSIS — E785 Hyperlipidemia, unspecified: Secondary | ICD-10-CM

## 2021-05-07 DIAGNOSIS — E1169 Type 2 diabetes mellitus with other specified complication: Secondary | ICD-10-CM

## 2021-05-07 DIAGNOSIS — Z23 Encounter for immunization: Secondary | ICD-10-CM

## 2021-05-07 DIAGNOSIS — Z1211 Encounter for screening for malignant neoplasm of colon: Secondary | ICD-10-CM

## 2021-05-07 DIAGNOSIS — Z794 Long term (current) use of insulin: Secondary | ICD-10-CM

## 2021-05-07 DIAGNOSIS — E119 Type 2 diabetes mellitus without complications: Secondary | ICD-10-CM

## 2021-05-07 DIAGNOSIS — E663 Overweight: Secondary | ICD-10-CM

## 2021-05-07 DIAGNOSIS — Z1159 Encounter for screening for other viral diseases: Secondary | ICD-10-CM

## 2021-05-07 LAB — GLUCOSE, POCT (MANUAL RESULT ENTRY): POC Glucose: 95 mg/dl (ref 70–99)

## 2021-05-07 LAB — POCT GLYCOSYLATED HEMOGLOBIN (HGB A1C): HbA1c, POC (controlled diabetic range): 6.1 % (ref 0.0–7.0)

## 2021-05-07 MED ORDER — ATORVASTATIN CALCIUM 20 MG PO TABS
20.0000 mg | ORAL_TABLET | Freq: Every day | ORAL | 5 refills | Status: DC
  Filled 2021-05-07: qty 30, 30d supply, fill #0

## 2021-05-07 MED ORDER — METFORMIN HCL 1000 MG PO TABS
1000.0000 mg | ORAL_TABLET | Freq: Two times a day (BID) | ORAL | 5 refills | Status: DC
  Filled 2021-05-07 – 2021-11-12 (×2): qty 60, 30d supply, fill #0

## 2021-05-07 MED ORDER — INSULIN DETEMIR 100 UNIT/ML ~~LOC~~ SOLN
10.0000 [IU] | Freq: Every day | SUBCUTANEOUS | 1 refills | Status: DC
  Filled 2021-05-07: qty 10, 42d supply, fill #0

## 2021-05-07 MED ORDER — SITAGLIPTIN PHOSPHATE 100 MG PO TABS
100.0000 mg | ORAL_TABLET | Freq: Every day | ORAL | 5 refills | Status: DC
Start: 2021-05-07 — End: 2022-06-17
  Filled 2021-05-07: qty 90, 90d supply, fill #0

## 2021-05-07 NOTE — Patient Instructions (Signed)
Alimentacin saludable Healthy Eating Seguir una modalidad de alimentacin saludable puede ayudarlo a alcanzar y mantener un peso saludable, reducir el riesgo de tener enfermedades crnicas y vivir una vida larga y productiva. Es importante que siga una modalidad de alimentacin saludable con un nivel adecuado de caloras para su cuerpo. Debe cubrir sus necesidades nutricionales principalmente a travs de los alimentos, escogiendo una variedad de alimentos ricos en nutrientes. Cules son algunos consejos para seguir este plan? Lea las etiquetas de los alimentos Lea las etiquetas y elija las que digan lo siguiente: Reducido en sodio o con bajo contenido de sodio. Jugos con 100 % jugo de fruta. Alimentos con bajo contenido de grasas saturadas y alto contenido de grasas poliinsaturadas y monoinsaturadas. Alimentos con cereales integrales, como trigo integral, trigo partido, arroz integral y arroz salvaje. Cereales integrales fortificados con cido flico. Se recomienda a las mujeres embarazadas o que desean quedar embarazadas. Lea las etiquetas y evite: Los alimentos con una gran cantidad de azcares agregados. Estos incluyen los alimentos que contienen azcar moreno, endulzante a base de maz, jarabe de maz, dextrosa, fructosa, glucosa, jarabe de maz de alta fructosa, miel, azcar invertido, lactosa, jarabe de malta, maltosa, melaza, azcar sin refinar, sacarosa, trehalosa y azcar turbinado. No consuma ms que las siguientes cantidades de azcar agregada por da: 6 cucharaditas (25 g) las mujeres. 9 cucharaditas (38 g) los hombres. Los alimentos que contienen almidones y cereales refinados o procesados. Los productos de cereales refinados, como harina blanca, harina de maz desgerminada, pan blanco y arroz blanco. Al ir de compras Elija refrigerios ricos en nutrientes, como verduras, frutas enteras y frutos secos. Evite los refrigerios con alto contenido de caloras y azcar, como las papas  fritas, los refrigerios frutales y los caramelos. Use alios y productos para untar a base de aceite con los alimentos en lugar de grasas slidas como la mantequilla, la margarina en barra o el queso crema. Limite las salsas, las mezclas y los productos "instantneos" preelaborados como el arroz saborizado, los fideos instantneos y las pastas listas para comer. Pruebe ms fuentes de protena vegetal, como tofu, tempeh, frijoles negros, edamame, lentejas, frutos secos y semillas. Explore planes de alimentacin como la dieta mediterrnea o la dieta vegetariana. Al cocinar Use aceite para saltear los alimentos en lugar de grasas slidas como mantequilla, margarina en barra o manteca de cerdo. En lugar de frer, trate de cocinar en el horno, en la plancha o en la parrilla, o hervir los alimentos. Retire la parte grasa de las carnes antes de cocinarlas. Cocine las verduras al vapor en agua o caldo. Planificacin de las comidas  En las comidas, imagine dividir su plato en cuartos: La mitad del plato tiene frutas y verduras. Un cuarto del plato tiene cereales integrales. Un cuarto del plato tiene protena, especialmente carnes magras, aves, huevos, tofu, frijoles o frutos secos. Incluya lcteos descremados en su dieta diaria. Estilo de vida Elija opciones saludables en todos los mbitos, como en el hogar, el trabajo, la escuela, los restaurantes y las tiendas. Prepare los alimentos de un modo seguro: Lvese las manos despus de manipular carnes crudas. Mantenga las superficies de preparacin de los alimentos limpias lavndolas regularmente con agua caliente y jabn. Mantenga las carnes crudas separadas de los alimentos que estn listos para comer como las frutas y las verduras. Cocine los frutos de mar, carnes, aves y huevos hasta alcanzar la temperatura interna recomendada. Almacene los alimentos a temperaturas seguras. En general: Mantenga los alimentos fros a una temperatura de 40   F (4,4 C)  o inferior. Mantenga los alimentos calientes a una temperatura de 140 F (60 C) o superior. Mantenga el congelador a una temperatura de 0 F (-17,8 C) o inferior. Los alimentos dejan de ser seguros para su consumo cuando han estado a una temperatura de entre 40 y 140 F (4,4 y 60 C) por ms de 2 horas. Qu alimentos debo consumir? Frutas Propngase comer el equivalente a 2 tazas de frutas frescas, enlatadas (en su jugo natural) o congeladas cada da. Algunos ejemplos de equivalentes a 1 taza de frutas son 1 manzana pequea, 8 fresas grandes, 1 taza de fruta enlatada,  taza de fruta desecada o 1 taza de jugo 100 %. Verduras Propngase comer el equivalente a 2 o 3 tazas de verduras frescas y congeladas cada da, incluyendo diferentes variedades y colores. Algunos ejemplos de equivalentes a 1 taza de verduras son 2 zanahorias medianas, 2 tazas de verduras de hoja verde crudas, 1 taza de verduras cortadas (crudas o cocidas) o 1 papa mediana al horno. Granos Propngase comer el equivalente a 6 onzas de cereales integrales por da. Algunos ejemplos de equivalentes a 1 onza de cereales son 1 rebanada de pan, 1 taza de cereal listo para comer, 3 tazas de palomitas de maz o  taza de arroz, pasta o cereales cocidos. Carnes y otras protenas Propngase comer el equivalente a 5 o 6 onzas de protena por da. Algunos ejemplos de equivalentes a 1 onza de protena son 1 huevo,  taza de frutos secos o semillas o 1 cucharada (16 g) de mantequilla de man. Un corte de carne o pescado del tamao de un mazo de cartas equivale aproximadamente a 3 a 4 onzas. De las protenas que consume cada semana, intente que al menos 8 onzas provengan de frutos de mar. Esto incluye el salmn, la trucha, el arenque y las anchoas. Lcteos Propngase comer el equivalente a 3 tazas de lcteos descremados o con bajo contenido de grasa cada da. Algunos ejemplos de equivalentes a 1 taza de lcteos son 1 taza (240 ml) de leche, 8  onzas (250 g) de yogur, 1 onzas (44 g) de queso natural o 1 taza (240 ml) de leche de soja fortificada. Grasas y aceites Propngase consumir alrededor de 5 cucharaditas (21 g) por da. Elija grasas monoinsaturadas, como el aceite de canola y de oliva, aguacate, mantequilla de man y la mayora de los frutos secos, o bien grasas poliinsaturadas, como el aceite de girasol, maz y soja, nueces, piones, semillas de ssamo, semillas de girasol y semillas de lino. Bebidas Propngase beber seis vasos de 8 onzas de agua por da. Limite el caf a entre tres y cinco tazas de 8 onzas por da. Limite el consumo de bebidas con cafena que tengan caloras agregadas, como los refrescos y las bebidas energizantes. Limite el consumo de alcohol a no ms de 1 medida por da si es mujer y no est embarazada, y a 2 medidas por da si es hombre. Una medida equivale a 12 onzas de cerveza (355 ml), 5 onzas de vino (148 ml) o 1 onzas de bebidas alcohlicas de alta graduacin (44 ml). Condimentos y otros alimentos Evite agregar cantidades excesivas de sal a los alimentos. Pruebe darles sabor con hierbas y especias en lugar de sal. Evite agregar azcar a los alimentos. Pruebe usar alios, salsas y productos untables a base de aceite en lugar de grasas slidas. Esta informacin se basa en las pautas generales de nutricin de los [place] Para obtener   ms informacin, visite choosemyplate.gov. Las cantidades exactas pueden variar en funcin de sus necesidades nutricionales. Resumen Un plan de alimentacin saludable puede ayudarlo a mantener un peso saludable, reducir el riesgo de tener enfermedades crnicas y mantenerse activo durante toda su vida. Planifique sus comidas. Asegrese de consumir las porciones correctas de una variedad de alimentos ricos en nutrientes. En lugar de frer, trate de cocinar en el horno, en la plancha o en la parrilla, o hervir los alimentos. Elija opciones saludables en todos los mbitos, como en el  hogar, el trabajo, la escuela, los restaurantes y las tiendas. Esta informacin no tiene como fin reemplazar el consejo del mdico. Asegrese de hacerle al mdico cualquier pregunta que tenga. Document Revised: 12/08/2017 Document Reviewed: 12/08/2017 Elsevier Patient Education  2022 Elsevier Inc.  

## 2021-05-07 NOTE — Progress Notes (Signed)
Patient ID: Christie Martinez, female    DOB: [birth year]  MRN: [id number]  CC: Diabetes   Subjective: Christie Martinez is a 50 y.o. female who presents for chronic ds management Her concerns today include:  Patient with history of DM type II (dx 04/2019), HL, migraine without aura, diverticulitis with local perforation in the descending colon 04/2019.  DIABETES TYPE 2 Last A1C:   Results for orders placed or performed in visit on 05/07/21  POCT glucose (manual entry)  Result Value Ref Range   POC Glucose 95 70 - 99 mg/dl  POCT glycosylated hemoglobin (Hb A1C)  Result Value Ref Range   Hemoglobin A1C     HbA1c POC (<> result, manual entry)     HbA1c, POC (prediabetic range)     HbA1c, POC (controlled diabetic range) 6.1 0.0 - 7.0 %    Med Adherence:  [x]  Yes  -reports taking Januvia, Metformin, Levemir 10 units daily Medication side effects:  []  Yes    [x]  No Home Monitoring?  [x]  Yes once a day in mornings   []  No Home glucose results range: 96-120 Diet Adherence: [x]  Yes    []  No Exercise: [x]  Yes  5 days a wk  []  No Hypoglycemic episodes?: [x]  Yes - once    []  No Numbness of the feet? []  Yes    [x]  No Retinopathy hx? []  Yes    []  No Last eye exam: over due for eye exam.  She has scheduled one already Comments:   HL:  taking and tolerating Lipitor.  Out of med x few days  HM:  due for eye exam, colon CA screening (no fhx of colon CA), flu shot, hep C screening.  Had 2 COVID-19 vaccine shots  Patient Active Problem List   Diagnosis Date Noted   Migraine without aura and without status migrainosus, not intractable 06/15/2020   Hyperlipidemia associated with type 2 diabetes mellitus ([place]) 07/06/2019   Type 2 diabetes mellitus without complication, with long-term current use of insulin ([place]) 07/05/2019   History of diverticulitis 07/05/2019   Abnormal LFTs 07/05/2019   Diverticulitis of colon with perforation 05/06/2019     Current Outpatient  Medications on File Prior to Visit  Medication Sig Dispense Refill   blood glucose meter kit and supplies KIT Dispense based on patient and insurance preference. Use up to four times daily as directed. (FOR ICD-9 250.00, 250.01). 1 each 0   cetirizine (ZYRTEC) 10 MG tablet TAKE 1 CAPSULE (10 MG TOTAL) BY MOUTH DAILY FOR 10 DAYS. 10 tablet 0   Cetirizine HCl 10 MG CAPS Take 1 capsule (10 mg total) by mouth daily for 10 days. 10 capsule 0   glucose blood test strip Use as instructed to check blood sugar daily. 100 each 11   Insulin Syringe-Needle U-100 (INSULIN SYRINGE .5CC/31GX5/16") 31G X 5/16" 0.5 ML MISC Use to inject Levemir 15 units daily. 100 each 2   SUMAtriptan (IMITREX) 50 MG tablet TAKE 1 TABLET BY MOUTH AT THE START OF HEADACHE AS NEEDED. MAY REPEAT ONCE IN 2 HOURS IF HEADACE PERSISTS. NO MORE THAN 2 TABLETS IN 24 HOURS 10 tablet 1   topiramate (TOPAMAX) 25 MG tablet Take 1 tablet (25 mg total) by mouth at bedtime. 30 tablet 1   TRUEplus Lancets 28G MISC Use to check blood sugar TID. 100 each 2   No current facility-administered medications on file prior to visit.    No Known Allergies  Social History   Socioeconomic  History   Marital status: Single    Spouse name: Not on file   Number of children: Not on file   Years of education: Not on file   Highest education level: Not on file  Occupational History   Not on file  Tobacco Use   Smoking status: Never   Smokeless tobacco: Never  Vaping Use   Vaping Use: Never used  Substance and Sexual Activity   Alcohol use: No   Drug use: Never   Sexual activity: Yes  Other Topics Concern   Not on file  Social History Narrative   Not on file   Social Determinants of Health   Financial Resource Strain: Not on file  Food Insecurity: Not on file  Transportation Needs: Not on file  Physical Activity: Not on file  Stress: Not on file  Social Connections: Not on file  Intimate Partner Violence: Not on file    Family History   Problem Relation Age of Onset   Diabetes Father     No past surgical history on file.  ROS: Review of Systems Negative except as stated above  PHYSICAL EXAM: BP 118/80   Pulse 72   Resp 16   Wt 147 lb 3.2 oz (66.8 kg)   LMP 11/12/2016   SpO2 99%   BMI 28.75 kg/m   Wt Readings from Last 3 Encounters:  05/07/21 147 lb 3.2 oz (66.8 kg)  06/15/20 137 lb (62.1 kg)  04/10/20 137 lb 6.4 oz (62.3 kg)    Physical Exam  General appearance - alert, well appearing, middle-aged Hispanic female and in no distress Mental status - normal mood, behavior, speech, dress, motor activity, and thought processes Neck - supple, no significant adenopathy Chest - clear to auscultation, no wheezes, rales or rhonchi, symmetric air entry Heart - normal rate, regular rhythm, normal S1, S2, no murmurs, rubs, clicks or gallops Extremities - peripheral pulses normal, no pedal edema, no clubbing or cyanosis   CMP Latest Ref Rng & Units 06/15/2020 08/03/2019 07/05/2019  Glucose 65 - 99 mg/dL 92 - -  BUN 6 - 24 mg/dL 11 - -  Creatinine 5.93 - 1.00 mg/dL 4.45 5.79 -  Sodium 143 - 144 mmol/L 138 - -  Potassium 3.5 - 5.2 mmol/L 4.1 - -  Chloride 96 - 106 mmol/L 101 - -  CO2 20 - 29 mmol/L 23 - -  Calcium 8.7 - 10.2 mg/dL 9.6 - -  Total Protein 6.0 - 8.5 g/dL 6.9 - 7.2  Total Bilirubin 0.0 - 1.2 mg/dL 0.6 - 0.5  Alkaline Phos 44 - 121 IU/L 83 - 86  AST 0 - 40 IU/L 20 - 29  ALT 0 - 32 IU/L 19 - 32   Lipid Panel     Component Value Date/Time   CHOL 168 06/15/2020 1226   TRIG 69 06/15/2020 1226   HDL 55 06/15/2020 1226   CHOLHDL 3.1 06/15/2020 1226   LDLCALC 100 (H) 06/15/2020 1226    CBC    Component Value Date/Time   WBC 3.8 05/18/2019 1151   WBC 4.6 05/09/2019 0415   RBC 4.14 05/18/2019 1151   RBC 3.75 (L) 05/09/2019 0415   HGB 12.6 05/18/2019 1151   HCT 37.7 05/18/2019 1151   PLT 275 05/18/2019 1151   MCV 91 05/18/2019 1151   MCH 30.4 05/18/2019 1151   MCH 30.7 05/09/2019 0415   MCHC  33.4 05/18/2019 1151   MCHC 34.2 05/09/2019 0415   RDW 13.2 05/18/2019 1151  LYMPHSABS 1.4 05/18/2019 1151   MONOABS 0.4 05/09/2019 0415   EOSABS 0.0 05/18/2019 1151   BASOSABS 0.0 05/18/2019 1151   Depression screen [facility] 2/9 05/07/2021 04/10/2020 08/16/2019  Decreased Interest 1 0 0  Down, Depressed, Hopeless 0 0 0  PHQ - 2 Score 1 0 0     ASSESSMENT AND PLAN: 1. Type 2 diabetes mellitus without complication, with long-term current use of insulin (HCC) At goal.  Continue current medications, healthy eating habits and regular exercise.  urged her to follow through with getting her eye exam done - CBC - Comprehensive metabolic panel - Lipid panel - Microalbumin / creatinine urine ratio - POCT glucose (manual entry) - POCT glycosylated hemoglobin (Hb A1C) - sitaGLIPtin (JANUVIA) 100 MG tablet; Take 1 tablet (100 mg total) by mouth daily.  Dispense: 30 tablet; Refill: 5 - insulin detemir (LEVEMIR) 100 UNIT/ML injection; Inject 0.1 mLs (10 Units total) into the skin at bedtime.  Dispense: 10 mL; Refill: 1 - metFORMIN (GLUCOPHAGE) 1000 MG tablet; Take 1 tablet (1,000 mg total) by mouth 2 (two) times daily with a meal.  Dispense: 60 tablet; Refill: 5  2. Hyperlipidemia associated with type 2 diabetes mellitus (HCC) - atorvastatin (LIPITOR) 20 MG tablet; Take 1 tablet (20 mg total) by mouth daily.  Dispense: 30 tablet; Refill: 5  3. Overweight (BMI 25.0-29.9) See #1 above.  4. Need for immunization against influenza - Flu Vaccine QUAD 30mo+IM (Fluarix, Fluzone & Alfiuria Quad PF)  5. Need for hepatitis C screening test Patient agreeable to screening. - HCV Ab w Reflex to Quant PCR  6. Screening for colon cancer Patient at average risk for colon cancer screening.  She is uninsured.  She is willing to be screened with a fit test. - Fecal occult blood, imunochemical([facility]/Sun[facility])    Patient was given the opportunity to ask questions.  Patient verbalized understanding of the plan  and was able to repeat key elements of the plan.  [employer] Language interpreter used during this encounter. #[id number], Mikisli  Orders Placed This Encounter  Procedures   Fecal occult blood, imunochemical([facility]/Sunquest)   Flu Vaccine QUAD 27mo+IM (Fluarix, Fluzone & Alfiuria Quad PF)   CBC   Comprehensive metabolic panel   Lipid panel   Microalbumin / creatinine urine ratio   HCV Ab w Reflex to Quant PCR   POCT glucose (manual entry)   POCT glycosylated hemoglobin (Hb A1C)     Requested Prescriptions   Signed Prescriptions Disp Refills   sitaGLIPtin (JANUVIA) 100 MG tablet 30 tablet 5    Sig: Take 1 tablet (100 mg total) by mouth daily.   insulin detemir (LEVEMIR) 100 UNIT/ML injection 10 mL 1    Sig: Inject 0.1 mLs (10 Units total) into the skin at bedtime.   metFORMIN (GLUCOPHAGE) 1000 MG tablet 60 tablet 5    Sig: Take 1 tablet (1,000 mg total) by mouth 2 (two) times daily with a meal.   atorvastatin (LIPITOR) 20 MG tablet 30 tablet 5    Sig: Take 1 tablet (20 mg total) by mouth daily.    Return in about 4 months (around 09/07/2021).  Karle Plumber, MD, FACP

## 2021-05-08 LAB — CBC
Hematocrit: 39.2 % (ref 34.0–46.6)
Hemoglobin: 13.3 g/dL (ref 11.1–15.9)
MCH: 30.6 pg (ref 26.6–33.0)
MCHC: 33.9 g/dL (ref 31.5–35.7)
MCV: 90 fL (ref 79–97)
Platelets: 271 10*3/uL (ref 150–450)
RBC: 4.35 x10E6/uL (ref 3.77–5.28)
RDW: 13.1 % (ref 11.7–15.4)
WBC: 4.9 10*3/uL (ref 3.4–10.8)

## 2021-05-08 LAB — COMPREHENSIVE METABOLIC PANEL
ALT: 36 IU/L — ABNORMAL HIGH (ref 0–32)
AST: 26 IU/L (ref 0–40)
Albumin/Globulin Ratio: 2.1 (ref 1.2–2.2)
Albumin: 4.8 g/dL (ref 3.8–4.8)
Alkaline Phosphatase: 103 IU/L (ref 44–121)
BUN/Creatinine Ratio: 14 (ref 9–23)
BUN: 9 mg/dL (ref 6–24)
Bilirubin Total: 0.2 mg/dL (ref 0.0–1.2)
CO2: 25 mmol/L (ref 20–29)
Calcium: 9.6 mg/dL (ref 8.7–10.2)
Chloride: 103 mmol/L (ref 96–106)
Creatinine, Ser: 0.65 mg/dL (ref 0.57–1.00)
Globulin, Total: 2.3 g/dL (ref 1.5–4.5)
Glucose: 84 mg/dL (ref 70–99)
Potassium: 4.1 mmol/L (ref 3.5–5.2)
Sodium: 140 mmol/L (ref 134–144)
Total Protein: 7.1 g/dL (ref 6.0–8.5)
eGFR: 109 mL/min/{1.73_m2} (ref >59)

## 2021-05-08 LAB — LIPID PANEL
Chol/HDL Ratio: 3.7 ratio (ref 0.0–4.4)
Cholesterol, Total: 211 mg/dL — ABNORMAL HIGH (ref 100–199)
HDL: 57 mg/dL (ref >39)
LDL Chol Calc (NIH): 130 mg/dL — ABNORMAL HIGH (ref 0–99)
Triglycerides: 138 mg/dL (ref 0–149)
VLDL Cholesterol Cal: 24 mg/dL (ref 5–40)

## 2021-05-08 LAB — MICROALBUMIN / CREATININE URINE RATIO
Creatinine, Urine: 87 mg/dL
Microalb/Creat Ratio: 8 mg/g creat (ref 0–29)
Microalbumin, Urine: 6.7 ug/mL

## 2021-05-08 LAB — HCV AB W REFLEX TO QUANT PCR: HCV Ab: <0.1 s/co ratio (ref 0.0–0.9)

## 2021-05-08 LAB — HCV INTERPRETATION

## 2021-05-09 ENCOUNTER — Other Ambulatory Visit: Payer: Self-pay | Admitting: Internal Medicine

## 2021-05-09 ENCOUNTER — Other Ambulatory Visit: Payer: Self-pay

## 2021-05-09 DIAGNOSIS — E785 Hyperlipidemia, unspecified: Secondary | ICD-10-CM

## 2021-05-09 DIAGNOSIS — E1169 Type 2 diabetes mellitus with other specified complication: Secondary | ICD-10-CM

## 2021-05-09 MED ORDER — ATORVASTATIN CALCIUM 20 MG PO TABS
30.0000 mg | ORAL_TABLET | Freq: Every day | ORAL | 5 refills | Status: DC
  Filled 2021-05-09 (×2): qty 45, 30d supply, fill #0

## 2021-05-09 NOTE — Progress Notes (Signed)
Blood cell counts are normal. Kidney function normal. Mild elevation in one of her liver enzymes which we will observe for now. LDL cholesterol is 130 with goal being less than 70.  Recommend increasing the atorvastatin from 20 mg daily to 30 mg daily.  This means she will take 1-1/2 tablets of the 20 mg tablet daily.  Updated prescription sent to the pharmacy.  Screening tests for hepatitis C and HIV are negative.

## 2021-05-10 ENCOUNTER — Other Ambulatory Visit: Payer: Self-pay

## 2021-05-10 ENCOUNTER — Telehealth: Payer: Self-pay

## 2021-05-10 ENCOUNTER — Ambulatory Visit: Payer: Self-pay | Attending: Internal Medicine

## 2021-05-10 NOTE — Telephone Encounter (Signed)
Contacted pt to go over lab results pt didn't answer lvm [employer] interperter: Rafel ID: [id number]   Sent a CRM and forward labs to NT to give pt labs when they call back   

## 2021-05-14 ENCOUNTER — Other Ambulatory Visit: Payer: Self-pay | Admitting: Internal Medicine

## 2021-05-17 LAB — FECAL OCCULT BLOOD, IMMUNOCHEMICAL: Fecal Occult Bld: NEGATIVE

## 2021-06-07 ENCOUNTER — Other Ambulatory Visit: Payer: Self-pay | Admitting: Obstetrics and Gynecology

## 2021-06-07 DIAGNOSIS — Z1231 Encounter for screening mammogram for malignant neoplasm of breast: Secondary | ICD-10-CM

## 2021-07-17 ENCOUNTER — Other Ambulatory Visit: Payer: Self-pay

## 2021-07-17 ENCOUNTER — Ambulatory Visit: Payer: Self-pay | Admitting: *Deleted

## 2021-07-17 ENCOUNTER — Ambulatory Visit: Admission: RE | Admit: 2021-07-17 | Payer: No Typology Code available for payment source | Source: Ambulatory Visit

## 2021-07-17 VITALS — BP 110/72 | Wt 150.0 lb

## 2021-07-17 DIAGNOSIS — N644 Mastodynia: Secondary | ICD-10-CM

## 2021-07-17 DIAGNOSIS — Z1239 Encounter for other screening for malignant neoplasm of breast: Secondary | ICD-10-CM

## 2021-07-17 DIAGNOSIS — Z1211 Encounter for screening for malignant neoplasm of colon: Secondary | ICD-10-CM

## 2021-07-17 NOTE — Patient Instructions (Signed)
Explained breast self awareness with [name]. Patient did not need a Pap smear today due to last Pap smear and HPV typing was 04/10/2020. Let her know BCCCP will cover Pap smears and HPV typing every 5 years unless has a history of abnormal Pap smears. Referred patient to the [facility] for a diagnostic mammogram. Appointment scheduled Tuesday, August 07, 2021 at 1240. Patient aware of appointment and will be there. Christie Martinez verbalized understanding.  Jandi Swiger, Kathaleen Maser, RN 9:32 AM

## 2021-07-17 NOTE — Addendum Note (Signed)
Addended by: Meryl Dare on: 07/17/2021 09:15 AM   Modules accepted: Orders

## 2021-07-17 NOTE — Progress Notes (Signed)
Christie Martinez is a 49 y.o. female who presents to [facility] clinic today with complaint of left upper outer quadrant breast pain x 7 months that comes and goes. Patient states when the pain comes it is constant for several days up to two weeks at a time. Patient rates the pain at a 7-8 out of 10.    Pap Smear: Pap smear not completed today. Last Pap smear was 04/10/2020 at [facility] and was normal with negative HPV. Per patient has no history of an abnormal Pap smear. Last Pap smear result is available in Epic.   Physical exam: Breasts Breasts symmetrical. No skin abnormalities bilateral breasts. No nipple retraction bilateral breasts. No nipple discharge bilateral breasts. No lymphadenopathy. No lumps palpated bilateral breasts. Complaints of left upper outer quadrant breast pain on exam.      MS DIGITAL SCREENING TOMO BILATERAL  Result Date: 05/01/2020 CLINICAL DATA:  Screening. EXAM: DIGITAL SCREENING BILATERAL MAMMOGRAM WITH TOMO AND CAD COMPARISON:  None. ACR Breast Density Category b: There are scattered areas of fibroglandular density. FINDINGS: There are no findings suspicious for malignancy. Images were processed with CAD. IMPRESSION: No mammographic evidence of malignancy. A result letter of this screening mammogram will be mailed directly to the patient. RECOMMENDATION: Screening mammogram in one year. (Code:SM-B-01Y) BI-RADS CATEGORY  1: Negative. Electronically Signed   By: Norva Pavlov M.D.   On: 05/01/2020 08:21     Pelvic/Bimanual Pap is not indicated today per BCCCP guidelines.   Smoking History: Patient has never smoked.   Patient Navigation: Patient education provided. Access to services provided for patient through [place] program. Spanish interpreter Christie Martinez from [facility] provided.   Colorectal Cancer Screening: Per patient has never had colonoscopy completed. Patient completed a FIT test through her PCP office  05/14/2021 that was negative. No complaints today.    Breast and Cervical Cancer Risk Assessment: Patient does not have family history of breast cancer, known genetic mutations, or radiation treatment to the chest before age 51. Patient does not have history of cervical dysplasia, immunocompromised, or DES exposure in-utero.  Risk Assessment     Risk Scores       07/17/2021   Last edited by: Narda Rutherford, LPN   5-year risk: 0.6 %   Lifetime risk: 5.8 %            A: BCCCP exam without pap smear Complaints of left upper outer quadrant breast pain.  P: Referred patient to the [facility] for a diagnostic mammogram. Appointment scheduled Tuesday, August 07, 2021 at 1240.  Priscille Heidelberg, RN 07/17/2021 9:32 AM

## 2021-08-07 ENCOUNTER — Other Ambulatory Visit: Payer: Self-pay

## 2021-08-07 ENCOUNTER — Ambulatory Visit
Admission: RE | Admit: 2021-08-07 | Discharge: 2021-08-07 | Disposition: A | Discharge status: Home / Self Care | Payer: No Typology Code available for payment source | Source: Ambulatory Visit | Attending: Obstetrics and Gynecology | Admitting: Obstetrics and Gynecology

## 2021-08-07 DIAGNOSIS — N644 Mastodynia: Secondary | ICD-10-CM

## 2021-09-10 ENCOUNTER — Other Ambulatory Visit (HOSPITAL_COMMUNITY)
Admission: RE | Admit: 2021-09-10 | Discharge: 2021-09-10 | Disposition: A | Discharge status: Home / Self Care | Payer: No Typology Code available for payment source | Source: Ambulatory Visit | Attending: Internal Medicine | Admitting: Internal Medicine

## 2021-09-10 ENCOUNTER — Ambulatory Visit: Payer: Self-pay | Attending: Internal Medicine | Admitting: Internal Medicine

## 2021-09-10 ENCOUNTER — Other Ambulatory Visit: Payer: Self-pay | Admitting: Internal Medicine

## 2021-09-10 ENCOUNTER — Other Ambulatory Visit: Payer: Self-pay

## 2021-09-10 ENCOUNTER — Encounter: Payer: Self-pay | Admitting: Internal Medicine

## 2021-09-10 VITALS — BP 118/78 | HR 76 | Resp 16 | Ht <= 58 in | Wt 151.6 lb

## 2021-09-10 DIAGNOSIS — Z6831 Body mass index (BMI) 31.0-31.9, adult: Secondary | ICD-10-CM

## 2021-09-10 DIAGNOSIS — E785 Hyperlipidemia, unspecified: Secondary | ICD-10-CM

## 2021-09-10 DIAGNOSIS — R1012 Left upper quadrant pain: Secondary | ICD-10-CM | POA: Insufficient documentation

## 2021-09-10 DIAGNOSIS — E669 Obesity, unspecified: Secondary | ICD-10-CM

## 2021-09-10 DIAGNOSIS — E1169 Type 2 diabetes mellitus with other specified complication: Secondary | ICD-10-CM

## 2021-09-10 LAB — POCT GLYCOSYLATED HEMOGLOBIN (HGB A1C): HbA1c, POC (controlled diabetic range): 6.5 % (ref 0.0–7.0)

## 2021-09-10 LAB — POCT URINALYSIS DIP (CLINITEK)
Bilirubin, UA: NEGATIVE
Glucose, UA: 500 mg/dL — AB
Leukocytes, UA: NEGATIVE
Nitrite, UA: NEGATIVE
POC PROTEIN,UA: NEGATIVE
Spec Grav, UA: 1.025 (ref 1.010–1.025)
Urobilinogen, UA: 0.2 E.U./dL
pH, UA: 6.5 (ref 5.0–8.0)

## 2021-09-10 MED ORDER — CIPROFLOXACIN HCL 500 MG PO TABS
500.0000 mg | ORAL_TABLET | Freq: Two times a day (BID) | ORAL | 0 refills | Status: AC
  Filled 2021-09-10: qty 14, 7d supply, fill #0

## 2021-09-10 MED ORDER — METRONIDAZOLE 500 MG PO TABS
500.0000 mg | ORAL_TABLET | Freq: Two times a day (BID) | ORAL | 0 refills | Status: DC
  Filled 2021-09-10: qty 14, 7d supply, fill #0

## 2021-09-10 NOTE — Progress Notes (Signed)
Patient ID: Christie Martinez, female    DOB: [birth year]  MRN: [id number]  CC: Diabetes and Abdominal Pain (Left side /Pain scale 6 of 10 )   Subjective: Christie Martinez is a 51 y.o. female who presents for chronic ds management Her concerns today include:  Patient with history of DM type II (dx 04/2019), HL, migraine without aura, diverticulitis with local perforation in the descending colon 04/2019.  DIABETES TYPE 2/Obesity Last A1C:   Results for orders placed or performed in visit on 09/10/21  POCT glycosylated hemoglobin (Hb A1C)  Result Value Ref Range   Hemoglobin A1C     HbA1c POC (<> result, manual entry)     HbA1c, POC (prediabetic range)     HbA1c, POC (controlled diabetic range) 6.5 0.0 - 7.0 %  POCT URINALYSIS DIP (CLINITEK)  Result Value Ref Range   Color, UA yellow yellow   Clarity, UA clear clear   Glucose, UA =500 (A) negative mg/dL   Bilirubin, UA negative negative   Ketones, POC UA trace (5) (A) negative mg/dL   Spec Grav, UA 1.025 1.010 - 1.025   Blood, UA trace-intact (A) negative   pH, UA 6.5 5.0 - 8.0   POC PROTEIN,UA negative negative, trace   Urobilinogen, UA 0.2 0.2 or 1.0 E.U./dL   Nitrite, UA Negative Negative   Leukocytes, UA Negative Negative    Med Adherence:  [x]  Yes  - on Januvia 100 mg, metformin 1 gram BID and Levemir 10 units daily   Medication side effects:  []  Yes    [x]  No Home Monitoring?  []  Yes    [x]  No needs lancets/test stripes Home glucose results range: Diet Adherence: [x]  Yes    []  No Exercise: []  Yes    [x]  No due to cold weather Hypoglycemic episodes?: []  Yes    []  No Numbness of the feet? []  Yes    [x]  No Retinopathy hx? []  Yes    []  No Last eye exam: over due for eye exam.  No insurance Comments:   HL: taking and tolerating Lipitor  Started having a little intermittent pain on LT side of the abdomin x several days. Pt points to mid to upper LT abdomin.  Sometimes can last all day. Sharp and rates as  6/10 when it comes on No fever. Some nausea.  Diarrhea x 2 days when it first started.  Last loose stools was 5 days ago.  No blood in stools.  Last BM was this a.m Nothing makes it worse, but when she gets the pain she tries not to eat.  Had tortia, eggs and coffee this a.m.   No dysuria or hematuria.  No abnormal vaginal discharge.   No menses in 2 yrs. Patient Active Problem List   Diagnosis Date Noted   Migraine without aura and without status migrainosus, not intractable 06/15/2020   Hyperlipidemia associated with type 2 diabetes mellitus ([place]) 07/06/2019   Type 2 diabetes mellitus without complication, with long-term current use of insulin ([place]) 07/05/2019   History of diverticulitis 07/05/2019   Abnormal LFTs 07/05/2019   Diverticulitis of colon with perforation 05/06/2019     Current Outpatient Medications on File Prior to Visit  Medication Sig Dispense Refill   atorvastatin (LIPITOR) 20 MG tablet Take 1.5 tablets (30 mg total) by mouth daily. 45 tablet 5   blood glucose meter kit and supplies KIT Dispense based on patient and insurance preference. Use up to four times daily as directed. (FOR  ICD-9 250.00, 250.01). 1 each 0   cetirizine (ZYRTEC) 10 MG tablet TAKE 1 CAPSULE (10 MG TOTAL) BY MOUTH DAILY FOR 10 DAYS. 10 tablet 0   Cetirizine HCl 10 MG CAPS Take 1 capsule (10 mg total) by mouth daily for 10 days. 10 capsule 0   glucose blood test strip Use as instructed to check blood sugar daily. 100 each 11   insulin detemir (LEVEMIR) 100 UNIT/ML injection Inject 0.1 mLs (10 Units total) into the skin at bedtime. 10 mL 1   Insulin Syringe-Needle U-100 (INSULIN SYRINGE .5CC/31GX5/16") 31G X 5/16" 0.5 ML MISC Use to inject Levemir 15 units daily. 100 each 2   metFORMIN (GLUCOPHAGE) 1000 MG tablet Take 1 tablet (1,000 mg total) by mouth 2 (two) times daily with a meal. 60 tablet 5   sitaGLIPtin (JANUVIA) 100 MG tablet Take 1 tablet (100 mg total) by mouth daily. 30 tablet 5    SUMAtriptan (IMITREX) 50 MG tablet TAKE 1 TABLET BY MOUTH AT THE START OF HEADACHE AS NEEDED. MAY REPEAT ONCE IN 2 HOURS IF HEADACE PERSISTS. NO MORE THAN 2 TABLETS IN 24 HOURS 10 tablet 1   topiramate (TOPAMAX) 25 MG tablet Take 1 tablet (25 mg total) by mouth at bedtime. 30 tablet 1   TRUEplus Lancets 28G MISC Use to check blood sugar TID. 100 each 2   No current facility-administered medications on file prior to visit.    No Known Allergies  Social History   Socioeconomic History   Marital status: Single    Spouse name: Not on file   Number of children: 3   Years of education: Not on file   Highest education level: 9th grade  Occupational History   Not on file  Tobacco Use   Smoking status: Never   Smokeless tobacco: Never  Vaping Use   Vaping Use: Never used  Substance and Sexual Activity   Alcohol use: No   Drug use: Never   Sexual activity: Yes    Birth control/protection: Post-menopausal  Other Topics Concern   Not on file  Social History Narrative   Not on file   Social Determinants of Health   Financial Resource Strain: Not on file  Food Insecurity: No Food Insecurity   Worried About Running Out of Food in the Last Year: Never true   Ran Out of Food in the Last Year: Never true  Transportation Needs: No Transportation Needs   Lack of Transportation (Medical): No   Lack of Transportation (Non-Medical): No  Physical Activity: Not on file  Stress: Not on file  Social Connections: Not on file  Intimate Partner Violence: Not on file    Family History  Problem Relation Age of Onset   Diabetes Father     No past surgical history on file.  ROS: Review of Systems Negative except as stated above  PHYSICAL EXAM: BP 118/78    Pulse 76    Resp 16    Ht $R'4\' 10"'vS$  (1.473 m)    Wt 151 lb 9.6 oz (68.8 kg)    LMP 11/12/2016    SpO2 99%    BMI 31.68 kg/m   Wt Readings from Last 3 Encounters:  09/10/21 151 lb 9.6 oz (68.8 kg)  07/17/21 150 lb (68 kg)  05/07/21 147  lb 3.2 oz (66.8 kg)    Physical Exam  General appearance - alert, well appearing, and in no distress Mental status - normal mood, behavior, speech, dress, motor activity, and thought processes Chest -  clear to auscultation, no wheezes, rales or rhonchi, symmetric air entry Heart - normal rate, regular rhythm, normal S1, S2, no murmurs, rubs, clicks or gallops Abdomen -normal bowel sounds, nondistended, soft, mild left lower quadrant tenderness without guarding or rebound. Extremities - peripheral pulses normal, no pedal edema, no clubbing or cyanosis   CMP Latest Ref Rng & Units 05/07/2021 06/15/2020 08/03/2019  Glucose 70 - 99 mg/dL 84 92 -  BUN 6 - 24 mg/dL 9 11 -  Creatinine 0.57 - 1.00 mg/dL 0.65 0.64 0.52  Sodium 134 - 144 mmol/L 140 138 -  Potassium 3.5 - 5.2 mmol/L 4.1 4.1 -  Chloride 96 - 106 mmol/L 103 101 -  CO2 20 - 29 mmol/L 25 23 -  Calcium 8.7 - 10.2 mg/dL 9.6 9.6 -  Total Protein 6.0 - 8.5 g/dL 7.1 6.9 -  Total Bilirubin 0.0 - 1.2 mg/dL 0.2 0.6 -  Alkaline Phos 44 - 121 IU/L 103 83 -  AST 0 - 40 IU/L 26 20 -  ALT 0 - 32 IU/L 36(H) 19 -   Lipid Panel     Component Value Date/Time   CHOL 211 (H) 05/07/2021 1436   TRIG 138 05/07/2021 1436   HDL 57 05/07/2021 1436   CHOLHDL 3.7 05/07/2021 1436   LDLCALC 130 (H) 05/07/2021 1436    CBC    Component Value Date/Time   WBC 4.9 05/07/2021 1436   WBC 4.6 05/09/2019 0415   RBC 4.35 05/07/2021 1436   RBC 3.75 (L) 05/09/2019 0415   HGB 13.3 05/07/2021 1436   HCT 39.2 05/07/2021 1436   PLT 271 05/07/2021 1436   MCV 90 05/07/2021 1436   MCH 30.6 05/07/2021 1436   MCH 30.7 05/09/2019 0415   MCHC 33.9 05/07/2021 1436   MCHC 34.2 05/09/2019 0415   RDW 13.1 05/07/2021 1436   LYMPHSABS 1.4 05/18/2019 1151   MONOABS 0.4 05/09/2019 0415   EOSABS 0.0 05/18/2019 1151   BASOSABS 0.0 05/18/2019 1151   Results for orders placed or performed in visit on 09/10/21  POCT glycosylated hemoglobin (Hb A1C)  Result Value Ref  Range   Hemoglobin A1C     HbA1c POC (<> result, manual entry)     HbA1c, POC (prediabetic range)     HbA1c, POC (controlled diabetic range) 6.5 0.0 - 7.0 %  POCT URINALYSIS DIP (CLINITEK)  Result Value Ref Range   Color, UA yellow yellow   Clarity, UA clear clear   Glucose, UA =500 (A) negative mg/dL   Bilirubin, UA negative negative   Ketones, POC UA trace (5) (A) negative mg/dL   Spec Grav, UA 1.025 1.010 - 1.025   Blood, UA trace-intact (A) negative   pH, UA 6.5 5.0 - 8.0   POC PROTEIN,UA negative negative, trace   Urobilinogen, UA 0.2 0.2 or 1.0 E.U./dL   Nitrite, UA Negative Negative   Leukocytes, UA Negative Negative     ASSESSMENT AND PLAN: 1. Type 2 diabetes mellitus with obesity (HCC) Controlled.  Continue meds listed above Get eye exam done when she can afford Encouraged to exercise indoors like walking in place for 30 mins 5 days a wk - POCT glycosylated hemoglobin (Hb A1C)  2. Left upper quadrant abdominal pain Differential diagnosis include gastroenteritis versus diverticulitis I think mild diverticulitis is most likely the cause.  Advised patient to do liquid diet for the next 48 hours.  I have placed her on ciprofloxacin and Flagyl for 7 days.  Follow-up if no improvement.  If any worsening of symptoms, patient advised to be seen in the emergency room. - CBC With Differential - POCT URINALYSIS DIP (CLINITEK) - Cervicovaginal ancillary only  3. Hyperlipidemia associated with type 2 diabetes mellitus (HCC) Continue Lipitor.    [employer] Language interpreter used during this encounter. #[id number], [place]  Patient was given the opportunity to ask questions.  Patient verbalized understanding of the plan and was able to repeat key elements of the plan.   Orders Placed This Encounter  Procedures   CBC With Differential   POCT glycosylated hemoglobin (Hb A1C)   POCT URINALYSIS DIP (CLINITEK)     Requested Prescriptions   Signed Prescriptions Disp Refills    ciprofloxacin (CIPRO) 500 MG tablet 14 tablet 0    Sig: Take 1 tablet (500 mg total) by mouth 2 (two) times daily for 7 days.   metroNIDAZOLE (FLAGYL) 500 MG tablet 14 tablet 0    Sig: Take 1 tablet (500 mg total) by mouth 2 (two) times daily.    Return in about 4 months (around 01/08/2022).  Karle Plumber, MD, FACP

## 2021-09-11 ENCOUNTER — Other Ambulatory Visit: Payer: Self-pay

## 2021-09-11 LAB — CBC WITH DIFFERENTIAL
Basophils Absolute: 0 10*3/uL (ref 0.0–0.2)
Basos: 0 %
EOS (ABSOLUTE): 0.1 10*3/uL (ref 0.0–0.4)
Eos: 2 %
Hematocrit: 38 % (ref 34.0–46.6)
Hemoglobin: 12.9 g/dL (ref 11.1–15.9)
Immature Grans (Abs): 0 10*3/uL (ref 0.0–0.1)
Immature Granulocytes: 0 %
Lymphocytes Absolute: 1.7 10*3/uL (ref 0.7–3.1)
Lymphs: 37 %
MCH: 30.4 pg (ref 26.6–33.0)
MCHC: 33.9 g/dL (ref 31.5–35.7)
MCV: 89 fL (ref 79–97)
Monocytes Absolute: 0.4 10*3/uL (ref 0.1–0.9)
Monocytes: 8 %
Neutrophils Absolute: 2.4 10*3/uL (ref 1.4–7.0)
Neutrophils: 53 %
RBC: 4.25 x10E6/uL (ref 3.77–5.28)
RDW: 12.4 % (ref 11.7–15.4)
WBC: 4.6 10*3/uL (ref 3.4–10.8)

## 2021-09-11 LAB — CERVICOVAGINAL ANCILLARY ONLY
Chlamydia: NEGATIVE
Comment: NEGATIVE
Comment: NEGATIVE
Comment: NORMAL
Neisseria Gonorrhea: NEGATIVE
Trichomonas: NEGATIVE

## 2021-09-11 MED ORDER — TRUEPLUS LANCETS 28G MISC
2 refills | Status: DC
  Filled 2021-09-11 – 2021-09-19 (×2): qty 100, 33d supply, fill #0

## 2021-09-11 NOTE — Telephone Encounter (Signed)
Requested Prescriptions  Pending Prescriptions Disp Refills   TRUEplus Lancets 28G MISC 100 each 2    Sig: Use to check blood sugar TID.     Endocrinology: Diabetes - Testing Supplies Passed - 09/10/2021 11:07 AM      Passed - Valid encounter within last 12 months    Recent Outpatient Visits          Yesterday Type 2 diabetes mellitus with obesity (HCC)   [facility] [place], Gavin Pound B, MD   4 months ago Type 2 diabetes mellitus without complication, with long-term current use of insulin (HCC)   [place] [facility] Jonah Blue B, MD   1 year ago Type 2 diabetes mellitus without complication, with long-term current use of insulin [facility])   [place] Marcine Matar, MD   1 year ago Cervical cancer screening   [facility] [place], [place], NP   1 year ago Need for influenza vaccination   [facility] Lois Huxley, Cornelius Moras, RPH-CPP      Future Appointments            In 3 months Laural Benes Binnie Rail, MD [facility]

## 2021-09-12 ENCOUNTER — Encounter (INDEPENDENT_AMBULATORY_CARE_PROVIDER_SITE_OTHER): Payer: Self-pay

## 2021-09-18 ENCOUNTER — Other Ambulatory Visit: Payer: Self-pay

## 2021-09-19 ENCOUNTER — Other Ambulatory Visit: Payer: Self-pay

## 2021-09-19 ENCOUNTER — Other Ambulatory Visit: Payer: Self-pay | Admitting: Internal Medicine

## 2021-09-19 DIAGNOSIS — E119 Type 2 diabetes mellitus without complications: Secondary | ICD-10-CM

## 2021-09-19 DIAGNOSIS — Z794 Long term (current) use of insulin: Secondary | ICD-10-CM

## 2021-09-19 MED ORDER — GLUCOSE BLOOD VI STRP
ORAL_STRIP | 11 refills | Status: DC
  Filled 2021-09-19: qty 100, 90d supply, fill #0

## 2021-09-19 NOTE — Telephone Encounter (Signed)
Requested medication (s) are due for refill today: Yes  Requested medication (s) are on the active medication list: Yes  Last refill:  06/26/20  Future visit scheduled: Yes  Notes to clinic:  Prescription expired.    Requested Prescriptions  Pending Prescriptions Disp Refills   glucose blood test strip 100 each 11    Sig: Use as instructed to check blood sugar daily.     Endocrinology: Diabetes - Testing Supplies Passed - 09/19/2021  8:39 AM      Passed - Valid encounter within last 12 months    Recent Outpatient Visits           1 week ago Type 2 diabetes mellitus with obesity (HCC)   [facility] [place], Gavin Pound B, MD   4 months ago Type 2 diabetes mellitus without complication, with long-term current use of insulin (HCC)   [facility] Jonah Blue B, MD   1 year ago Type 2 diabetes mellitus without complication, with long-term current use of insulin [facility])   [facility] Marcine Matar, MD   1 year ago Cervical cancer screening    [facility] [place], [place], NP   1 year ago Need for influenza vaccination   [facility] Lois Huxley, Cornelius Moras, RPH-CPP       Future Appointments             In 3 months Laural Benes Binnie Rail, MD [facility]

## 2021-09-25 ENCOUNTER — Other Ambulatory Visit: Payer: Self-pay

## 2021-11-12 ENCOUNTER — Other Ambulatory Visit: Payer: Self-pay

## 2022-01-08 ENCOUNTER — Ambulatory Visit: Payer: Self-pay | Attending: Internal Medicine | Admitting: Internal Medicine

## 2022-01-08 ENCOUNTER — Encounter: Payer: Self-pay | Admitting: Internal Medicine

## 2022-01-08 VITALS — BP 107/71 | HR 71 | Temp 98.0°F | Resp 16 | Wt 150.0 lb

## 2022-01-08 DIAGNOSIS — E785 Hyperlipidemia, unspecified: Secondary | ICD-10-CM

## 2022-01-08 DIAGNOSIS — E669 Obesity, unspecified: Secondary | ICD-10-CM

## 2022-01-08 DIAGNOSIS — Z6831 Body mass index (BMI) 31.0-31.9, adult: Secondary | ICD-10-CM

## 2022-01-08 DIAGNOSIS — E1169 Type 2 diabetes mellitus with other specified complication: Secondary | ICD-10-CM

## 2022-01-08 LAB — POCT GLYCOSYLATED HEMOGLOBIN (HGB A1C): Hemoglobin A1C: 6.5 % — AB (ref 4.0–5.6)

## 2022-01-08 LAB — GLUCOSE, POCT (MANUAL RESULT ENTRY): POC Glucose: 139 mg/dl — AB (ref 70–99)

## 2022-01-08 NOTE — Progress Notes (Signed)
Patient ID: Christie Martinez, female    DOB: 30-Aug-1970  MRN: [id number]  CC: chronic ds management  Subjective: Christie Martinez is a 51 y.o. female who presents for chronic ds management Her concerns today include:  Patient with history of DM type II (dx 04/2019), HL, migraine without aura, diverticulitis with local perforation in the descending colon 04/2019.  DIABETES TYPE 2 Last A1C:   Results for orders placed or performed in visit on 01/08/22  POCT glucose (manual entry)  Result Value Ref Range   POC Glucose 139 (A) 70 - 99 mg/dl  POCT glycosylated hemoglobin (Hb A1C)  Result Value Ref Range   Hemoglobin A1C 6.5 (A) 4.0 - 5.6 %   HbA1c POC (<> result, manual entry)     HbA1c, POC (prediabetic range)     HbA1c, POC (controlled diabetic range)      Med Adherence:  _0  Yes Januvia 100 mg, Metformin 1 gram BID and Levemir 10 units. Medication side effects:  _1  Yes    _2  No Home Monitoring?  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  She also walks with her dog and kids daily.  Weight has remained stable but BMI is still in the range for mild obesity. Hypoglycemic episodes?: _7  Yes    _8  No Numbness of the feet? _9  Yes    _10  No Retinopathy hx? _11  Yes    _12  No Last eye exam: Reports having had eye exam at [facility] 01/04/2022.  Told she needs reading glasses. Comments:   HL:  compliant with taking Lipitor Last LDL was 130 in the fall of last year.  We had her increase the atorvastatin to 30 mg daily.  Patient Active Problem List   Diagnosis Date Noted   Migraine without aura and without status migrainosus, not intractable 06/15/2020   Hyperlipidemia associated with type 2 diabetes mellitus ([place]) 07/06/2019   Type 2 diabetes mellitus without complication, with long-term current use of insulin ([place]) 07/05/2019   History of  diverticulitis 07/05/2019   Abnormal LFTs 07/05/2019   Diverticulitis of colon with perforation 05/06/2019     Current Outpatient Medications on File Prior to Visit  Medication Sig Dispense Refill   atorvastatin (LIPITOR) 20 MG tablet Take 1.5 tablets (30 mg total) by mouth daily. 45 tablet 5   blood glucose meter kit and supplies KIT Dispense based on patient and insurance preference. Use up to four times daily as directed. (FOR ICD-9 250.00, 250.01). 1 each 0   glucose blood test strip Use as instructed to check blood sugar daily. 100 each 11   insulin detemir (LEVEMIR) 100 UNIT/ML injection Inject 0.1 mLs (10 Units total) into the skin at bedtime. 10 mL 1   Insulin Syringe-Needle U-100 (INSULIN SYRINGE .5CC/31GX5/16") 31G X 5/16" 0.5 ML MISC Use to inject Levemir 15 units daily. 100 each 2   metFORMIN (GLUCOPHAGE) 1000 MG tablet Take 1 tablet (1,000 mg total) by mouth 2 (two) times daily with a meal. 60 tablet 5   metroNIDAZOLE (FLAGYL) 500 MG tablet Take 1 tablet (500 mg total) by mouth 2 (two) times daily. 14 tablet 0   sitaGLIPtin (JANUVIA) 100 MG tablet Take 1 tablet (100 mg total) by mouth daily. 30 tablet 5   topiramate (TOPAMAX) 25 MG tablet Take 1 tablet (25 mg  total) by mouth at bedtime. 30 tablet 1   TRUEplus Lancets 28G MISC Use to check blood sugar 3 times daily 100 each 2   cetirizine (ZYRTEC) 10 MG tablet TAKE 1 CAPSULE (10 MG TOTAL) BY MOUTH DAILY FOR 10 DAYS. 10 tablet 0   Cetirizine HCl 10 MG CAPS Take 1 capsule (10 mg total) by mouth daily for 10 days. 10 capsule 0   SUMAtriptan (IMITREX) 50 MG tablet TAKE 1 TABLET BY MOUTH AT THE START OF HEADACHE AS NEEDED. MAY REPEAT ONCE IN 2 HOURS IF HEADACE PERSISTS. NO MORE THAN 2 TABLETS IN 24 HOURS 10 tablet 1   No current facility-administered medications on file prior to visit.    No Known Allergies  Social History   Socioeconomic History   Marital status: Single    Spouse name: Not on file   Number of children: 3    Years of education: Not on file   Highest education level: 9th grade  Occupational History   Not on file  Tobacco Use   Smoking status: Never   Smokeless tobacco: Never  Vaping Use   Vaping Use: Never used  Substance and Sexual Activity   Alcohol use: No   Drug use: Never   Sexual activity: Yes    Birth control/protection: Post-menopausal  Other Topics Concern   Not on file  Social History Narrative   Not on file   Social Determinants of Health   Financial Resource Strain: Not on file  Food Insecurity: No Food Insecurity (07/17/2021)   Hunger Vital Sign    Worried About Running Out of Food in the Last Year: Never true    Ran Out of Food in the Last Year: Never true  Transportation Needs: No Transportation Needs (07/17/2021)   PRAPARE - Hydrologist (Medical): No    Lack of Transportation (Non-Medical): No  Physical Activity: Not on file  Stress: Not on file  Social Connections: Not on file  Intimate Partner Violence: Not on file    Family History  Problem Relation Age of Onset   Diabetes Father     No past surgical history on file.  ROS: Review of Systems Negative except as stated above  PHYSICAL EXAM: BP 107/71   Pulse 71   Temp 98 F (36.7 C) (Oral)   Resp 16   Wt 150 lb (68 kg)   LMP 11/12/2016   SpO2 97%   BMI 31.35 kg/m   Wt Readings from Last 3 Encounters:  01/08/22 150 lb (68 kg)  09/10/21 151 lb 9.6 oz (68.8 kg)  07/17/21 150 lb (68 kg)    Physical Exam  General appearance - alert, well appearing, and in no distress Mental status - normal mood, behavior, speech, dress, motor activity, and thought processes Chest - clear to auscultation, no wheezes, rales or rhonchi, symmetric air entry Heart - normal rate, regular rhythm, normal S1, S2, no murmurs, rubs, clicks or gallops Extremities - peripheral pulses normal, no pedal edema, no clubbing or cyanosis Diabetic Foot Exam - Simple   Simple Foot Form Diabetic  Foot exam was performed with the following findings: Yes 01/08/2022 10:59 AM  Visual Inspection No deformities, no ulcerations, no other skin breakdown bilaterally: Yes Sensation Testing Intact to touch and monofilament testing bilaterally: Yes Pulse Check Posterior Tibialis and Dorsalis pulse intact bilaterally: Yes Comments         Latest Ref Rng & Units 05/07/2021    2:36 PM 06/15/2020  12:26 PM 08/03/2019   12:51 PM  CMP  Glucose 70 - 99 mg/dL 84  92    BUN 6 - 24 mg/dL 9  11    Creatinine 0.57 - 1.00 mg/dL 0.65  0.64  0.52   Sodium 134 - 144 mmol/L 140  138    Potassium 3.5 - 5.2 mmol/L 4.1  4.1    Chloride 96 - 106 mmol/L 103  101    CO2 20 - 29 mmol/L 25  23    Calcium 8.7 - 10.2 mg/dL 9.6  9.6    Total Protein 6.0 - 8.5 g/dL 7.1  6.9    Total Bilirubin 0.0 - 1.2 mg/dL 0.2  0.6    Alkaline Phos 44 - 121 IU/L 103  83    AST 0 - 40 IU/L 26  20    ALT 0 - 32 IU/L 36  19     Lipid Panel     Component Value Date/Time   CHOL 211 (H) 05/07/2021 1436   TRIG 138 05/07/2021 1436   HDL 57 05/07/2021 1436   CHOLHDL 3.7 05/07/2021 1436   LDLCALC 130 (H) 05/07/2021 1436    CBC    Component Value Date/Time   WBC 4.6 09/10/2021 1054   WBC 4.6 05/09/2019 0415   RBC 4.25 09/10/2021 1054   RBC 3.75 (L) 05/09/2019 0415   HGB 12.9 09/10/2021 1054   HCT 38.0 09/10/2021 1054   PLT 271 05/07/2021 1436   MCV 89 09/10/2021 1054   MCH 30.4 09/10/2021 1054   MCH 30.7 05/09/2019 0415   MCHC 33.9 09/10/2021 1054   MCHC 34.2 05/09/2019 0415   RDW 12.4 09/10/2021 1054   LYMPHSABS 1.7 09/10/2021 1054   MONOABS 0.4 05/09/2019 0415   EOSABS 0.1 09/10/2021 1054   BASOSABS 0.0 09/10/2021 1054    ASSESSMENT AND PLAN:  1. Type 2 diabetes mellitus with obesity (HCC) At goal.  Commended on healthy eating habits and regular exercise.  Encouraged her to keep up the good work. Continue Januvia, metformin and twice a day Levemir 10 units daily - POCT glucose (manual entry) - POCT  glycosylated hemoglobin (Hb A1C)  2. Hyperlipidemia associated with type 2 diabetes mellitus ([place]) Due for recheck of function.  Lipitor 30 mg daily. - Lipid panel - Hepatic Function Panel   [employer] Language interpreter used during this encounter. #[id number], [place]  Patient was given the opportunity to ask questions.  Patient verbalized understanding of the plan and was able to repeat key elements of the plan.   This documentation was completed using Radio producer.  Any transcriptional errors are unintentional.  Orders Placed This Encounter  Procedures   Lipid panel   Hepatic Function Panel   POCT glucose (manual entry)   POCT glycosylated hemoglobin (Hb A1C)     Requested Prescriptions    No prescriptions requested or ordered in this encounter    Return in about 4 months (around 05/10/2022).  Karle Plumber, MD, FACP

## 2022-01-08 NOTE — Progress Notes (Signed)
Patient is here for f/u diabetes mellitus..  Patient said she has no concerns for the provider today

## 2022-01-09 ENCOUNTER — Other Ambulatory Visit: Payer: Self-pay | Admitting: Internal Medicine

## 2022-01-09 ENCOUNTER — Other Ambulatory Visit: Payer: Self-pay

## 2022-01-09 DIAGNOSIS — E1169 Type 2 diabetes mellitus with other specified complication: Secondary | ICD-10-CM

## 2022-01-09 LAB — HEPATIC FUNCTION PANEL
ALT: 30 IU/L (ref 0–32)
AST: 26 IU/L (ref 0–40)
Albumin: 4.6 g/dL (ref 3.8–4.8)
Alkaline Phosphatase: 101 IU/L (ref 44–121)
Bilirubin Total: 0.4 mg/dL (ref 0.0–1.2)
Bilirubin, Direct: 0.13 mg/dL (ref 0.00–0.40)
Total Protein: 6.7 g/dL (ref 6.0–8.5)

## 2022-01-09 LAB — LIPID PANEL
Chol/HDL Ratio: 3.6 ratio (ref 0.0–4.4)
Cholesterol, Total: 196 mg/dL (ref 100–199)
HDL: 54 mg/dL (ref >39)
LDL Chol Calc (NIH): 117 mg/dL — ABNORMAL HIGH (ref 0–99)
Triglycerides: 142 mg/dL (ref 0–149)
VLDL Cholesterol Cal: 25 mg/dL (ref 5–40)

## 2022-01-09 MED ORDER — ATORVASTATIN CALCIUM 40 MG PO TABS
40.0000 mg | ORAL_TABLET | Freq: Every day | ORAL | 6 refills | Status: DC
  Filled 2022-01-09 – 2022-01-24 (×2): qty 30, 30d supply, fill #0

## 2022-01-09 NOTE — Progress Notes (Signed)
Let patient know that her cholesterol level has improved but still not at goal.  Level is now 117 with goal being less than 70.  I recommend increasing the atorvastatin from 30 mg daily to 40 mg daily.  I will send prescription to the pharmacy for the 40 mg tablets.  Liver function test normal.

## 2022-01-10 ENCOUNTER — Emergency Department (HOSPITAL_BASED_OUTPATIENT_CLINIC_OR_DEPARTMENT_OTHER)
Admission: EM | Admit: 2022-01-10 | Discharge: 2022-01-10 | Disposition: A | Discharge status: Home / Self Care | Payer: No Typology Code available for payment source | Attending: Emergency Medicine | Admitting: Emergency Medicine

## 2022-01-10 ENCOUNTER — Emergency Department (HOSPITAL_BASED_OUTPATIENT_CLINIC_OR_DEPARTMENT_OTHER): Payer: No Typology Code available for payment source

## 2022-01-10 ENCOUNTER — Encounter (HOSPITAL_BASED_OUTPATIENT_CLINIC_OR_DEPARTMENT_OTHER): Payer: Self-pay | Admitting: Emergency Medicine

## 2022-01-10 ENCOUNTER — Emergency Department (HOSPITAL_BASED_OUTPATIENT_CLINIC_OR_DEPARTMENT_OTHER): Payer: No Typology Code available for payment source | Admitting: Radiology

## 2022-01-10 ENCOUNTER — Other Ambulatory Visit: Payer: Self-pay

## 2022-01-10 DIAGNOSIS — Z794 Long term (current) use of insulin: Secondary | ICD-10-CM | POA: Insufficient documentation

## 2022-01-10 DIAGNOSIS — E119 Type 2 diabetes mellitus without complications: Secondary | ICD-10-CM | POA: Insufficient documentation

## 2022-01-10 DIAGNOSIS — R42 Dizziness and giddiness: Secondary | ICD-10-CM | POA: Insufficient documentation

## 2022-01-10 DIAGNOSIS — Z7984 Long term (current) use of oral hypoglycemic drugs: Secondary | ICD-10-CM | POA: Insufficient documentation

## 2022-01-10 DIAGNOSIS — R202 Paresthesia of skin: Secondary | ICD-10-CM | POA: Insufficient documentation

## 2022-01-10 LAB — BASIC METABOLIC PANEL
Anion gap: 11 (ref 5–15)
BUN: 9 mg/dL (ref 6–20)
CO2: 24 mmol/L (ref 22–32)
Calcium: 9.4 mg/dL (ref 8.9–10.3)
Chloride: 105 mmol/L (ref 98–111)
Creatinine, Ser: 0.54 mg/dL (ref 0.44–1.00)
GFR, Estimated: >60 mL/min (ref >60)
Glucose, Bld: 95 mg/dL (ref 70–99)
Potassium: 3.5 mmol/L (ref 3.5–5.1)
Sodium: 140 mmol/L (ref 135–145)

## 2022-01-10 LAB — URINALYSIS, ROUTINE W REFLEX MICROSCOPIC
Bilirubin Urine: NEGATIVE
Glucose, UA: NEGATIVE mg/dL
Hgb urine dipstick: NEGATIVE
Ketones, ur: NEGATIVE mg/dL
Leukocytes,Ua: NEGATIVE
Nitrite: NEGATIVE
Protein, ur: NEGATIVE mg/dL
Specific Gravity, Urine: 1.006 (ref 1.005–1.030)
pH: 5.5 (ref 5.0–8.0)

## 2022-01-10 LAB — CBC
HCT: 36.2 % (ref 36.0–46.0)
Hemoglobin: 12.2 g/dL (ref 12.0–15.0)
MCH: 29.8 pg (ref 26.0–34.0)
MCHC: 33.7 g/dL (ref 30.0–36.0)
MCV: 88.3 fL (ref 80.0–100.0)
Platelets: 152 10*3/uL (ref 150–400)
RBC: 4.1 MIL/uL (ref 3.87–5.11)
RDW: 12.2 % (ref 11.5–15.5)
WBC: 5 10*3/uL (ref 4.0–10.5)
nRBC: 0 % (ref 0.0–0.2)

## 2022-01-10 LAB — PREGNANCY, URINE: Preg Test, Ur: NEGATIVE

## 2022-01-10 LAB — TROPONIN I (HIGH SENSITIVITY): Troponin I (High Sensitivity): <2 ng/L (ref <18)

## 2022-01-10 MED ORDER — SODIUM CHLORIDE 0.9 % IV BOLUS
1000.0000 mL | Freq: Once | INTRAVENOUS | Status: AC
Start: 2022-01-10 — End: 2022-01-10
  Administered 2022-01-10: 1000 mL via INTRAVENOUS

## 2022-01-10 MED ORDER — IOHEXOL 300 MG/ML  SOLN
100.0000 mL | Freq: Once | INTRAMUSCULAR | Status: AC | PRN
  Administered 2022-01-10: 75 mL via INTRAVENOUS

## 2022-01-10 NOTE — ED Provider Notes (Signed)
[facility] EMERGENCY DEPT Provider Note   CSN: [id number] Arrival date & time: 01/10/22  1244     History  Chief Complaint  Patient presents with   Dizziness    Christie Martinez is a 51 y.o. female.  HPI 51 year old female presents with multiple complaints.  She states that yesterday she noticed some right eye swelling.  She has been having some blurry vision out of both eyes.  She is having tingling in her face that is on both sides but more prominent on her right cheek.  Has also been feeling dizzy which is hard to gauge what exactly this means, despite with the interpreter.  Seems like she is mostly lightheaded.  However she has been able to ambulate without difficulty.  Has a slight headache and starting this morning she will developed a little bit of chest pain.  No weakness or numbness in the extremities.  No double vision or visual field cut symptoms.  When asked about her teeth, she indicates that she has transiently had some right mandibular molar discomfort but not really right now.  She has a history of hyperlipidemia and diabetes.  Home Medications Prior to Admission medications   Medication Sig Start Date End Date Taking? Authorizing Provider  atorvastatin (LIPITOR) 40 MG tablet Take 1 tablet (40 mg total) by mouth daily. 01/09/22   Ladell Pier, MD  blood glucose meter kit and supplies KIT Dispense based on patient and insurance preference. Use up to four times daily as directed. (FOR ICD-9 250.00, 250.01). 05/10/19   Aline August, MD  cetirizine (ZYRTEC) 10 MG tablet TAKE 1 CAPSULE (10 MG TOTAL) BY MOUTH DAILY FOR 10 DAYS. 08/01/20 08/01/21  Wieters, Hallie C, PA-C  Cetirizine HCl 10 MG CAPS Take 1 capsule (10 mg total) by mouth daily for 10 days. 08/01/20 08/11/20  Wieters, Hallie C, PA-C  glucose blood test strip Use as instructed to check blood sugar daily. 09/19/21   Ladell Pier, MD  insulin detemir (LEVEMIR) 100 UNIT/ML injection Inject  0.1 mLs (10 Units total) into the skin at bedtime. 05/07/21   Ladell Pier, MD  Insulin Syringe-Needle U-100 (INSULIN SYRINGE .5CC/31GX5/16") 31G X 5/16" 0.5 ML MISC Use to inject Levemir 15 units daily. 06/16/20   Ladell Pier, MD  metFORMIN (GLUCOPHAGE) 1000 MG tablet Take 1 tablet (1,000 mg total) by mouth 2 (two) times daily with a meal. 05/07/21   Ladell Pier, MD  metroNIDAZOLE (FLAGYL) 500 MG tablet Take 1 tablet (500 mg total) by mouth 2 (two) times daily. 09/10/21   Ladell Pier, MD  sitaGLIPtin (JANUVIA) 100 MG tablet Take 1 tablet (100 mg total) by mouth daily. 05/07/21   Ladell Pier, MD  SUMAtriptan (IMITREX) 50 MG tablet TAKE 1 TABLET BY MOUTH AT THE START OF HEADACHE AS NEEDED. MAY REPEAT ONCE IN 2 HOURS IF HEADACE PERSISTS. NO MORE THAN 2 TABLETS IN 24 HOURS 06/15/20 06/15/21  Ladell Pier, MD  topiramate (TOPAMAX) 25 MG tablet Take 1 tablet (25 mg total) by mouth at bedtime. 06/15/20   Ladell Pier, MD  TRUEplus Lancets 28G MISC Use to check blood sugar 3 times daily 09/11/21   Ladell Pier, MD      Allergies    Patient has no known allergies.    Review of Systems   Review of Systems  HENT:  Positive for facial swelling.   Eyes:  Positive for visual disturbance.  Cardiovascular:  Positive for chest pain.  Neurological:  Positive for numbness and headaches. Negative for weakness.    Physical Exam Updated Vital Signs BP 118/72   Pulse 65   Temp 97.8 F (36.6 C) (Oral)   Resp (!) 23   LMP 11/12/2016   SpO2 96%  Physical Exam Vitals and nursing note reviewed.  Constitutional:      General: She is not in acute distress.    Appearance: She is well-developed. She is not ill-appearing or diaphoretic.  HENT:     Head: Normocephalic and atraumatic.      Mouth/Throat:     Comments: No obviously abnormal dentition or dental infection/abscess Eyes:     Extraocular Movements: Extraocular movements intact.     Pupils: Pupils  are equal, round, and reactive to light.  Cardiovascular:     Rate and Rhythm: Normal rate and regular rhythm.     Heart sounds: Normal heart sounds.  Pulmonary:     Effort: Pulmonary effort is normal.     Breath sounds: Normal breath sounds.  Abdominal:     General: There is no distension.     Palpations: Abdomen is soft.     Tenderness: There is no abdominal tenderness.  Skin:    General: Skin is warm and dry.  Neurological:     Mental Status: She is alert.     Comments: CN 3-12 grossly intact. 5/5 strength in all 4 extremities. Grossly normal sensation, including both sides of her face and right cheek. Normal finger to nose.      ED Results / Procedures / Treatments   Labs (all labs ordered are listed, but only abnormal results are displayed) Labs Reviewed  URINALYSIS, ROUTINE W REFLEX MICROSCOPIC - Abnormal; Notable for the following components:      Result Value   Color, Urine COLORLESS (*)    All other components within normal limits  BASIC METABOLIC PANEL  CBC  PREGNANCY, URINE  TROPONIN I (HIGH SENSITIVITY)    EKG EKG Interpretation  Date/Time:  Thursday January 10 2022 13:19:12 EDT Ventricular Rate:  61 PR Interval:  151 QRS Duration: 79 QT Interval:  415 QTC Calculation: 418 R Axis:   58 Text Interpretation: Sinus rhythm Low voltage, precordial leads Abnormal R-wave progression, early transition rate slower than prior 10/20 Confirmed by Aletta Edouard [phone]) on 01/10/2022 2:30:22 PM  Radiology CT Maxillofacial W Contrast  Result Date: 01/10/2022 CLINICAL DATA:  Dizziness and right eye pain, tingling in right cheek EXAM: CT MAXILLOFACIAL WITH CONTRAST TECHNIQUE: Multidetector CT imaging of the maxillofacial structures was performed with intravenous contrast. Multiplanar CT image reconstructions were also generated. RADIATION DOSE REDUCTION: This exam was performed according to the departmental dose-optimization program which includes automated exposure control,  adjustment of the mA and/or kV according to patient size and/or use of iterative reconstruction technique. CONTRAST:  72m OMNIPAQUE IOHEXOL 300 MG/ML  SOLN COMPARISON:  None Available. FINDINGS: Osseous: There is no acute facial bone fracture. There is no suspicious osseous lesion. Orbits: The globes and orbits are unremarkable. There is no inflammatory change or traumatic finding. Sinuses: Clear. Soft tissues: Unremarkable. There is no soft tissue swelling or abnormal fluid collection. There is no abnormal soft tissue enhancement. Limited intracranial: Assessed on the separately dictated head CT. IMPRESSION: Normal facial bone CT. Electronically Signed   By: PValetta MoleM.D.   On: 01/10/2022 16:50   CT Head Wo Contrast  Result Date: 01/10/2022 CLINICAL DATA:  Dizziness and right eye pain tingling to right cheek EXAM: CT HEAD WITHOUT CONTRAST  TECHNIQUE: Contiguous axial images were obtained from the base of the skull through the vertex without intravenous contrast. RADIATION DOSE REDUCTION: This exam was performed according to the departmental dose-optimization program which includes automated exposure control, adjustment of the mA and/or kV according to patient size and/or use of iterative reconstruction technique. COMPARISON:  None Available. FINDINGS: Brain: There is no acute intracranial hemorrhage, extra-axial fluid collection, or acute infarct. Parenchymal volume is normal. The ventricles are normal in size. Gray-white differentiation is preserved. There is no mass lesion.  There is no mass effect or midline shift. Vascular: No hyperdense vessel or unexpected calcification. Skull: Normal. Negative for fracture or focal lesion. Sinuses/Orbits: The imaged paranasal sinuses are clear, evaluated in full on the separately dictated CT facial bones. The globes and orbits are unremarkable, also better evaluated on the separate CT facial bones. Other: None. IMPRESSION: Normal noncontrast head CT. Please also  refer to the separately dictated CT facial bones. Electronically Signed   By: Valetta Mole M.D.   On: 01/10/2022 16:29   DG Chest 2 View  Result Date: 01/10/2022 CLINICAL DATA:  Per ED Notes - Patient presents to ED via POV with c/o dizziness and right sided eye pain. Reports tingling to right cheek. Denies arm or leg numbness/tingling. Symptoms began yesterday at 0700. No slurred speech noted. No facial droop. EXAM: CHEST - 2 VIEW COMPARISON:  None Available. FINDINGS: Lungs are clear. Heart size and mediastinal contours are within normal limits. No effusion. Visualized bones unremarkable. IMPRESSION: No acute cardiopulmonary disease. Electronically Signed   By: Lucrezia Europe M.D.   On: 01/10/2022 16:11    Procedures Procedures    Medications Ordered in ED Medications  sodium chloride 0.9 % bolus 1,000 mL (1,000 mLs Intravenous New Bag/Given 01/10/22 1529)  iohexol (OMNIPAQUE) 300 MG/ML solution 100 mL (75 mLs Intravenous Contrast Given 01/10/22 1551)    ED Course/ Medical Decision Making/ A&P                           Medical Decision Making Amount and/or Complexity of Data Reviewed External Data Reviewed: notes. Labs: ordered. Radiology: ordered and independent interpretation performed. ECG/medicine tests: ordered and independent interpretation performed.  Risk Prescription drug management.   Patient has multiple complaints and does have significant past medical history which includes hyperlipidemia and diabetes.  However despite the tingling in her face, I think stroke is pretty unlikely.  It is mostly bilateral but right worse than left.  This would not really make sense from a stroke perspective.  There does appear to be a slight facial swelling and so a CT of her face was obtained and I also did a CT head.  I reviewed both these images and there might be slight swelling on the face but there is no obvious abscess and no head bleed on the CT.  No obvious hypodensity.  Labs have been  reviewed/interpreted and are normal besides colorless urine.  Electrolytes benign.  EKG is unremarkable.  She was given a liter of fluid.  After initial work-up I went to reevaluate her with the interpreter and she reports her symptoms have resolved.  Unclear why she had the symptoms but I suspicion of TIA/stroke is fairly low.  I think is reasonable to have her follow-up with PCP but for what ever reason she had some paresthesias.  Will discharge home with return precautions.        Final Clinical Impression(s) / ED Diagnoses Final  diagnoses:  Tingling of face    Rx / DC Orders ED Discharge Orders     None         Sherwood Gambler, MD 01/10/22 1801

## 2022-01-10 NOTE — ED Triage Notes (Signed)
Patient presents to ED via POV with c/o dizziness and right sided eye pain. Reports tingling to right cheek. Denies arm or leg numbness/tingling. Symptoms began yesterday at 0700. No slurred speech noted. No facial droop.

## 2022-01-10 NOTE — Discharge Instructions (Signed)
If you develop new or worsening numbness, facial droop or swelling, headache, dizziness, weakness or numbness in the arms or legs, trouble speaking, or any other new/concerning symptoms then return to the ER or call 911.  Si desarrolla entumecimiento nuevo o que empeora, cada o hinchazn facial, dolor de cabeza, mareos, debilidad o entumecimiento en los brazos o las piernas, dificultad para hablar o cualquier otro sntoma nuevo o preocupante, regrese a la sala de emergencias o llame al 911.

## 2022-01-10 NOTE — ED Notes (Signed)
Patient transported to CT 

## 2022-01-10 NOTE — ED Notes (Signed)
Pt returned from CT °

## 2022-01-16 ENCOUNTER — Other Ambulatory Visit: Payer: Self-pay

## 2022-01-24 ENCOUNTER — Other Ambulatory Visit: Payer: Self-pay

## 2022-05-10 ENCOUNTER — Other Ambulatory Visit: Payer: Self-pay

## 2022-05-10 ENCOUNTER — Ambulatory Visit: Payer: No Typology Code available for payment source | Attending: Internal Medicine | Admitting: Internal Medicine

## 2022-05-10 ENCOUNTER — Encounter: Payer: Self-pay | Admitting: Internal Medicine

## 2022-05-10 VITALS — BP 115/80 | HR 76 | Temp 98.2°F | Ht <= 58 in | Wt 145.4 lb

## 2022-05-10 DIAGNOSIS — E782 Mixed hyperlipidemia: Secondary | ICD-10-CM

## 2022-05-10 DIAGNOSIS — E1169 Type 2 diabetes mellitus with other specified complication: Secondary | ICD-10-CM

## 2022-05-10 DIAGNOSIS — Z23 Encounter for immunization: Secondary | ICD-10-CM

## 2022-05-10 DIAGNOSIS — E1142 Type 2 diabetes mellitus with diabetic polyneuropathy: Secondary | ICD-10-CM

## 2022-05-10 DIAGNOSIS — E669 Obesity, unspecified: Secondary | ICD-10-CM

## 2022-05-10 DIAGNOSIS — Z6830 Body mass index (BMI) 30.0-30.9, adult: Secondary | ICD-10-CM

## 2022-05-10 LAB — POCT URINALYSIS DIP (CLINITEK)
Bilirubin, UA: NEGATIVE
Glucose, UA: 500 mg/dL — AB
Leukocytes, UA: NEGATIVE
Nitrite, UA: NEGATIVE
POC PROTEIN,UA: NEGATIVE
Spec Grav, UA: 1.01 (ref 1.010–1.025)
Urobilinogen, UA: 0.2 E.U./dL
pH, UA: 5.5 (ref 5.0–8.0)

## 2022-05-10 LAB — GLUCOSE, POCT (MANUAL RESULT ENTRY)
POC Glucose: 426 mg/dl — AB (ref 70–99)
POC Glucose: 343 mg/dl — AB (ref 70–99)

## 2022-05-10 LAB — POCT GLYCOSYLATED HEMOGLOBIN (HGB A1C): HbA1c, POC (controlled diabetic range): 11 % — AB (ref 0.0–7.0)

## 2022-05-10 MED ORDER — INSULIN LISPRO 100 UNIT/ML IJ SOLN
3.0000 [IU] | Freq: Two times a day (BID) | INTRAMUSCULAR | 11 refills | Status: DC
  Filled 2022-05-10: qty 10, 28d supply, fill #0

## 2022-05-10 MED ORDER — INSULIN ASPART 100 UNIT/ML IJ SOLN
6.0000 [IU] | Freq: Once | INTRAMUSCULAR | Status: AC
  Administered 2022-05-10: 6 [IU] via SUBCUTANEOUS

## 2022-05-10 MED ORDER — INSULIN DETEMIR 100 UNIT/ML ~~LOC~~ SOLN
15.0000 [IU] | Freq: Every day | SUBCUTANEOUS | 1 refills | Status: DC
  Filled 2022-05-10: qty 10, 42d supply, fill #0

## 2022-05-10 NOTE — Progress Notes (Signed)
Patient ID: Christie Martinez, female    DOB: [birth year]  MRN: [id number]  CC: Diabetes (DM f/u. August was covid positive, Intermittent numbness in legs, intermittent stabbing pain in chest and back.  /Reports that blood sugars have been higher. )   Subjective: Christie Martinez is a 51 y.o. female who presents for chronic ds management Her concerns today include:  Patient with history of DM type II (dx 04/2019), HL, migraine without aura, diverticulitis with local perforation in the descending colon 04/2019.   DIABETES TYPE 2 Last A1C:   Results for orders placed or performed in visit on 05/10/22  POCT glucose (manual entry)  Result Value Ref Range   POC Glucose 426 (A) 70 - 99 mg/dl  POCT glycosylated hemoglobin (Hb A1C)  Result Value Ref Range   Hemoglobin A1C     HbA1c POC (<> result, manual entry)     HbA1c, POC (prediabetic range)     HbA1c, POC (controlled diabetic range) 11.0 (A) 0.0 - 7.0 %  POCT URINALYSIS DIP (CLINITEK)  Result Value Ref Range   Color, UA yellow yellow   Clarity, UA clear clear   Glucose, UA =500 (A) negative mg/dL   Bilirubin, UA negative negative   Ketones, POC UA moderate (40) (A) negative mg/dL   Spec Grav, UA 1.010 1.010 - 1.025   Blood, UA trace-intact (A) negative   pH, UA 5.5 5.0 - 8.0   POC PROTEIN,UA negative negative, trace   Urobilinogen, UA 0.2 0.2 or 1.0 E.U./dL   Nitrite, UA Negative Negative   Leukocytes, UA Negative Negative  POCT glucose (manual entry)  Result Value Ref Range   POC Glucose 343 (A) 70 - 99 mg/dl   Reports BS increase since having COVID in August Med Adherence:  [x]  Yes.  She is on Januvia 100 mg daily, metformin 1 g twice a day and Levemir insulin 10 units daily. Medication side effects:  []  Yes    [x]  No Home Monitoring?  [x]  Yes every morning  Home glucose results range: 200s Diet Adherence: was taking cough syrup for 2 wks when she had COVID in August.  Also eat 2-3 oranges a day when she  had covid for the vitamin C Exercise: [x]  Yes  -exercises on stationary bike 4x/wk for 20 mins Hypoglycemic episodes?: []  Yes    [x]  No Numbness of the feet? []  Yes    [x]  No but numbness in legs x 2 wks, constant.  Cramps in legs one time RT>LT, got up and walked it out.    She endorses polyuria and polydipsia. Retinopathy hx? []  Yes    []  No Last eye exam:  Comments:   Patient Active Problem List   Diagnosis Date Noted   Migraine without aura and without status migrainosus, not intractable 06/15/2020   Hyperlipidemia associated with type 2 diabetes mellitus ([place]) 07/06/2019   Type 2 diabetes mellitus without complication, with long-term current use of insulin ([place]) 07/05/2019   History of diverticulitis 07/05/2019   Abnormal LFTs 07/05/2019   Diverticulitis of colon with perforation 05/06/2019     Current Outpatient Medications on File Prior to Visit  Medication Sig Dispense Refill   atorvastatin (LIPITOR) 40 MG tablet Take 1 tablet (40 mg total) by mouth daily. 30 tablet 6   blood glucose meter kit and supplies KIT Dispense based on patient and insurance preference. Use up to four times daily as directed. (FOR ICD-9 250.00, 250.01). 1 each 0   glucose blood test  strip Use as instructed to check blood sugar daily. 100 each 11   Insulin Syringe-Needle U-100 (INSULIN SYRINGE .5CC/31GX5/16") 31G X 5/16" 0.5 ML MISC Use to inject Levemir 15 units daily. 100 each 2   metFORMIN (GLUCOPHAGE) 1000 MG tablet Take 1 tablet (1,000 mg total) by mouth 2 (two) times daily with a meal. 60 tablet 5   sitaGLIPtin (JANUVIA) 100 MG tablet Take 1 tablet (100 mg total) by mouth daily. 30 tablet 5   topiramate (TOPAMAX) 25 MG tablet Take 1 tablet (25 mg total) by mouth at bedtime. 30 tablet 1   TRUEplus Lancets 28G MISC Use to check blood sugar 3 times daily 100 each 2   cetirizine (ZYRTEC) 10 MG tablet TAKE 1 CAPSULE (10 MG TOTAL) BY MOUTH DAILY FOR 10 DAYS. 10 tablet 0   Cetirizine HCl 10 MG CAPS Take 1  capsule (10 mg total) by mouth daily for 10 days. 10 capsule 0   SUMAtriptan (IMITREX) 50 MG tablet TAKE 1 TABLET BY MOUTH AT THE START OF HEADACHE AS NEEDED. MAY REPEAT ONCE IN 2 HOURS IF HEADACE PERSISTS. NO MORE THAN 2 TABLETS IN 24 HOURS 10 tablet 1   No current facility-administered medications on file prior to visit.    No Known Allergies  Social History   Socioeconomic History   Marital status: Married    Spouse name: Not on file   Number of children: 3   Years of education: Not on file   Highest education level: 9th grade  Occupational History   Not on file  Tobacco Use   Smoking status: Never   Smokeless tobacco: Never  Vaping Use   Vaping Use: Never used  Substance and Sexual Activity   Alcohol use: No   Drug use: Never   Sexual activity: Yes    Birth control/protection: Post-menopausal  Other Topics Concern   Not on file  Social History Narrative   Not on file   Social Determinants of Health   Financial Resource Strain: Not on file  Food Insecurity: No Food Insecurity (07/17/2021)   Hunger Vital Sign    Worried About Running Out of Food in the Last Year: Never true    Ran Out of Food in the Last Year: Never true  Transportation Needs: No Transportation Needs (07/17/2021)   PRAPARE - Hydrologist (Medical): No    Lack of Transportation (Non-Medical): No  Physical Activity: Not on file  Stress: Not on file  Social Connections: Not on file  Intimate Partner Violence: Not on file    Family History  Problem Relation Age of Onset   Diabetes Father     No past surgical history on file.  ROS: Review of Systems Negative except as stated above  PHYSICAL EXAM: BP 115/80 (BP Location: Left Arm, Cuff Size: Normal)   Pulse 76   Temp 98.2 F (36.8 C) (Oral)   Ht $R'4\' 10"'ts$  (1.473 m)   Wt 145 lb 6.4 oz (66 kg)   LMP 11/12/2016   SpO2 97%   BMI 30.39 kg/m   Wt Readings from Last 3 Encounters:  05/10/22 145 lb 6.4 oz (66 kg)   01/08/22 150 lb (68 kg)  09/10/21 151 lb 9.6 oz (68.8 kg)    Physical Exam  General appearance - alert, well appearing, and in no distress Mental status - normal mood, behavior, speech, dress, motor activity, and thought processes Neck - supple, no significant adenopathy Chest - clear to auscultation, no wheezes,  rales or rhonchi, symmetric air entry Heart - normal rate, regular rhythm, normal S1, S2, no murmurs, rubs, clicks or gallops Neurological -power in lower extremities 5/5 bilaterally.  Gross sensation intact in the lower extremities. Extremities - peripheral pulses normal, no pedal edema, no clubbing or cyanosis Diabetic Foot Exam - Simple   Simple Foot Form Diabetic Foot exam was performed with the following findings: Yes 05/10/2022  1:16 PM  Visual Inspection No deformities, no ulcerations, no other skin breakdown bilaterally: Yes Sensation Testing Intact to touch and monofilament testing bilaterally: Yes Pulse Check Posterior Tibialis and Dorsalis pulse intact bilaterally: Yes Comments     Results for orders placed or performed in visit on 05/10/22  POCT glucose (manual entry)  Result Value Ref Range   POC Glucose 426 (A) 70 - 99 mg/dl  POCT glycosylated hemoglobin (Hb A1C)  Result Value Ref Range   Hemoglobin A1C     HbA1c POC (<> result, manual entry)     HbA1c, POC (prediabetic range)     HbA1c, POC (controlled diabetic range) 11.0 (A) 0.0 - 7.0 %  POCT URINALYSIS DIP (CLINITEK)  Result Value Ref Range   Color, UA yellow yellow   Clarity, UA clear clear   Glucose, UA =500 (A) negative mg/dL   Bilirubin, UA negative negative   Ketones, POC UA moderate (40) (A) negative mg/dL   Spec Grav, UA 1.010 1.010 - 1.025   Blood, UA trace-intact (A) negative   pH, UA 5.5 5.0 - 8.0   POC PROTEIN,UA negative negative, trace   Urobilinogen, UA 0.2 0.2 or 1.0 E.U./dL   Nitrite, UA Negative Negative   Leukocytes, UA Negative Negative  POCT glucose (manual entry)   Result Value Ref Range   POC Glucose 343 (A) 70 - 99 mg/dl       Latest Ref Rng & Units 01/10/2022    2:18 PM 01/08/2022   11:05 AM 05/07/2021    2:36 PM  CMP  Glucose 70 - 99 mg/dL 95   84   BUN 6 - 20 mg/dL 9   9   Creatinine 0.44 - 1.00 mg/dL 0.54   0.65   Sodium 135 - 145 mmol/L 140   140   Potassium 3.5 - 5.1 mmol/L 3.5   4.1   Chloride 98 - 111 mmol/L 105   103   CO2 22 - 32 mmol/L 24   25   Calcium 8.9 - 10.3 mg/dL 9.4   9.6   Total Protein 6.0 - 8.5 g/dL  6.7  7.1   Total Bilirubin 0.0 - 1.2 mg/dL  0.4  0.2   Alkaline Phos 44 - 121 IU/L  101  103   AST 0 - 40 IU/L  26  26   ALT 0 - 32 IU/L  30  36    Lipid Panel     Component Value Date/Time   CHOL 196 01/08/2022 1105   TRIG 142 01/08/2022 1105   HDL 54 01/08/2022 1105   CHOLHDL 3.6 01/08/2022 1105   LDLCALC 117 (H) 01/08/2022 1105    CBC    Component Value Date/Time   WBC 5.0 01/10/2022 1418   RBC 4.10 01/10/2022 1418   HGB 12.2 01/10/2022 1418   HGB 12.9 09/10/2021 1054   HCT 36.2 01/10/2022 1418   HCT 38.0 09/10/2021 1054   PLT 152 01/10/2022 1418   PLT 271 05/07/2021 1436   MCV 88.3 01/10/2022 1418   MCV 89 09/10/2021 1054   MCH 29.8 01/10/2022  1418   MCHC 33.7 01/10/2022 1418   RDW 12.2 01/10/2022 1418   RDW 12.4 09/10/2021 1054   LYMPHSABS 1.7 09/10/2021 1054   MONOABS 0.4 05/09/2019 0415   EOSABS 0.1 09/10/2021 1054   BASOSABS 0.0 09/10/2021 1054    ASSESSMENT AND PLAN:  1. Type 2 diabetes mellitus with obesity (HCC) Not at goal and A1c significantly elevated compared to last visit. She has ketones in the urine.  We gave 6 units of NovoLog here in the office along with some water. We will check chemistry. Recommend increase Levemir to 15 units daily.  Add Humalog insulin 3 units twice a day with the 2 largest meals of the day.  Follow-up with clinical pharmacist in a few weeks.  Advised to bring blood sugar log with her. Continue regular exercise and healthy eating habits.  - POCT  glucose (manual entry) - POCT glycosylated hemoglobin (Hb A1C) - insulin detemir (LEVEMIR) 100 UNIT/ML injection; Inject 0.15 mLs (15 Units total) into the skin at bedtime.  Dispense: 10 mL; Refill: 1 - insulin lispro (HUMALOG) 100 UNIT/ML injection; Inject 0.03 mLs (3 Units total) into the skin 2 (two) times daily with the 2 largest meals of the day.  Dispense: 10 mL; Refill: 11 - Microalbumin / creatinine urine ratio - Basic Metabolic Panel - POCT URINALYSIS DIP (CLINITEK) - insulin aspart (novoLOG) injection 6 Units - POCT glucose (manual entry)  2. Mixed hyperlipidemia Continue atorvastatin.  3. Diabetic peripheral neuropathy (HCC) We will see whether symptoms improve with improvement of her blood sugars.  If not we can add gabapentin on subsequent visit.  4. Need for immunization against influenza - Flu Vaccine QUAD 5mo+IM (Fluarix, Fluzone & Alfiuria Quad PF)   [employer] Language interpreter used during this encounter. #[place], [id number]  Patient was given the opportunity to ask questions.  Patient verbalized understanding of the plan and was able to repeat key elements of the plan.   This documentation was completed using Radio producer.  Any transcriptional errors are unintentional.  Orders Placed This Encounter  Procedures   Flu Vaccine QUAD 87mo+IM (Fluarix, Fluzone & Alfiuria Quad PF)   Microalbumin / creatinine urine ratio   Basic Metabolic Panel   POCT glucose (manual entry)   POCT glycosylated hemoglobin (Hb A1C)   POCT URINALYSIS DIP (CLINITEK)   POCT glucose (manual entry)     Requested Prescriptions   Signed Prescriptions Disp Refills   insulin detemir (LEVEMIR) 100 UNIT/ML injection 10 mL 1    Sig: Inject 0.15 mLs (15 Units total) into the skin at bedtime.   insulin lispro (HUMALOG) 100 UNIT/ML injection 10 mL 11    Sig: Inject 0.03 mLs (3 Units total) into the skin 2 (two) times daily with the 2 largest meals of the day.    Return in about  4 months (around 09/10/2022) for Appt with [facility] in 3 wks for BS check.  Karle Plumber, MD, FACP

## 2022-05-11 LAB — BASIC METABOLIC PANEL
BUN/Creatinine Ratio: 18 (ref 9–23)
BUN: 12 mg/dL (ref 6–24)
CO2: 24 mmol/L (ref 20–29)
Calcium: 9.4 mg/dL (ref 8.7–10.2)
Chloride: 97 mmol/L (ref 96–106)
Creatinine, Ser: 0.65 mg/dL (ref 0.57–1.00)
Glucose: 325 mg/dL — ABNORMAL HIGH (ref 70–99)
Potassium: 4.2 mmol/L (ref 3.5–5.2)
Sodium: 137 mmol/L (ref 134–144)
eGFR: 108 mL/min/{1.73_m2} (ref >59)

## 2022-05-11 LAB — MICROALBUMIN / CREATININE URINE RATIO
Creatinine, Urine: 28.2 mg/dL
Microalb/Creat Ratio: <11 mg/g creat (ref 0–29)
Microalbumin, Urine: <3 ug/mL

## 2022-06-17 ENCOUNTER — Ambulatory Visit: Payer: Self-pay | Attending: Internal Medicine | Admitting: Pharmacist

## 2022-06-17 ENCOUNTER — Other Ambulatory Visit: Payer: Self-pay

## 2022-06-17 DIAGNOSIS — E119 Type 2 diabetes mellitus without complications: Secondary | ICD-10-CM

## 2022-06-17 DIAGNOSIS — E669 Obesity, unspecified: Secondary | ICD-10-CM

## 2022-06-17 DIAGNOSIS — E1169 Type 2 diabetes mellitus with other specified complication: Secondary | ICD-10-CM

## 2022-06-17 DIAGNOSIS — Z794 Long term (current) use of insulin: Secondary | ICD-10-CM

## 2022-06-17 MED ORDER — BASAGLAR KWIKPEN 100 UNIT/ML ~~LOC~~ SOPN
18.0000 [IU] | PEN_INJECTOR | Freq: Every day | SUBCUTANEOUS | 2 refills | Status: DC
  Filled 2022-06-17: qty 6, 33d supply, fill #0

## 2022-06-17 MED ORDER — METFORMIN HCL 1000 MG PO TABS
1000.0000 mg | ORAL_TABLET | Freq: Two times a day (BID) | ORAL | 5 refills | Status: DC
  Filled 2022-06-17: qty 60, 30d supply, fill #0

## 2022-06-17 MED ORDER — INSULIN LISPRO 100 UNIT/ML IJ SOLN
6.0000 [IU] | Freq: Two times a day (BID) | INTRAMUSCULAR | 11 refills | Status: DC
  Filled 2022-06-17: qty 10, 28d supply, fill #0

## 2022-06-17 NOTE — Progress Notes (Signed)
    S:    No chief complaint on file.  51 y.o. female who presents for diabetes evaluation, education, and management.  PMH is significant for T2DM, migraine without aura, diverticulitis with local perforation in the descending colon 04/2019.  Patient was referred and last seen by Primary Care Provider, Dr. Laural Benes, on 05/10/2022.   At last visit, A1c had increased to 11% from 6.5% in May 2023. Dr. Laural Benes increased levemir from 10 to 15 units and added Lispro 3 units BID.   Today, patient arrives in good spirits and presents with the assistance of an online interpretor. Reports minimal change in BG readings since starting meal time insulin.   Patient reports Diabetes was diagnosed in 2020.   Current diabetes medications include: Levemir 15 units daily, insulin lispro 3 units BID, metformin 1000 mg BID, Januvia 100 mg daily  Patient reports adherence to taking all medications as prescribed.   Insurance coverage: Uninsured   Patient denies hypoglycemic events.  Reported home fasting blood sugars: 215-218, none >300  Reported 2 hour post-meal/random blood sugars: not checking.  Patient reports nocturia (nighttime urination). 4-5x/night Patient reports neuropathy (nerve pain). Patient denies visual changes. Patient reports self foot exams.    O:  Lab Results  Component Value Date   HGBA1C 11.0 (A) 05/10/2022   Lipid Panel     Component Value Date/Time   CHOL 196 01/08/2022 1105   TRIG 142 01/08/2022 1105   HDL 54 01/08/2022 1105   CHOLHDL 3.6 01/08/2022 1105   LDLCALC 117 (H) 01/08/2022 1105   Clinical Atherosclerotic Cardiovascular Disease (ASCVD): No  The 10-year ASCVD risk score (Arnett DK, et al., 2019) is: 1.8%   Values used to calculate the score:     Age: 31 years     Sex: Female     Is Non-Hispanic African American: No     Diabetic: Yes     Tobacco smoker: No     Systolic Blood Pressure: 115 mmHg     Is BP treated: No     HDL Cholesterol: 54 mg/dL      Total Cholesterol: 196 mg/dL    A/P: Diabetes longstanding currently uncontrolled based on A1c (11%). Patient is able to verbalize appropriate hypoglycemia management plan. Medication adherence appears appropriate. Control is suboptimal due to needing insulin titration. -Discontinue Levemir 15 units daily as the manufacturer will no longer make this in 2024. -Started basal insulin Basaglar (insulin glargine) 18 units daily.  -Increased dose of rapid insulin Humalog (insulin lispro) to 6 units BID.  -Continued metformin 1000 mg BID. -Discontinue Januvia 100 mg daily as the application process for PASS has changed and this medication will be harder to obtain in the future.  -Encouraged patient to start checking PP BG.  -Patient educated on purpose, proper use, and potential adverse effects of insulin.  -Extensively discussed pathophysiology of diabetes, recommended lifestyle interventions, dietary effects on blood sugar control.  -Counseled on s/sx of and management of hypoglycemia.  -Next A1c anticipated January 2024.   Patient verbalized understanding of treatment plan.  Total time in face to face counseling 25 minutes.    Follow-up:  Pharmacist 1 month. PCP clinic visit on 09/10/2022.   Valeda Malm, Pharm.D. PGY-2 Ambulatory Care Pharmacy Resident 06/17/2022 8:50 AM

## 2022-07-25 ENCOUNTER — Other Ambulatory Visit: Payer: Self-pay

## 2022-07-25 ENCOUNTER — Ambulatory Visit: Payer: Self-pay | Attending: Internal Medicine | Admitting: Pharmacist

## 2022-07-25 DIAGNOSIS — Z794 Long term (current) use of insulin: Secondary | ICD-10-CM

## 2022-07-25 DIAGNOSIS — E119 Type 2 diabetes mellitus without complications: Secondary | ICD-10-CM

## 2022-07-25 MED ORDER — TRUEPLUS 5-BEVEL PEN NEEDLES 32G X 4 MM MISC
2 refills | Status: DC
  Filled 2022-07-25: qty 100, 25d supply, fill #0
  Filled 2023-02-18: qty 100, 25d supply, fill #1

## 2022-07-25 MED ORDER — INSULIN LISPRO (1 UNIT DIAL) 100 UNIT/ML (KWIKPEN)
8.0000 [IU] | PEN_INJECTOR | Freq: Two times a day (BID) | SUBCUTANEOUS | 2 refills | Status: DC
Start: 2022-07-25 — End: 2022-11-14
  Filled 2022-07-25: qty 6, 38d supply, fill #0

## 2022-07-25 MED ORDER — BASAGLAR KWIKPEN 100 UNIT/ML ~~LOC~~ SOPN
22.0000 [IU] | PEN_INJECTOR | Freq: Every day | SUBCUTANEOUS | 2 refills | Status: DC
  Filled 2022-07-25: qty 6, 27d supply, fill #0

## 2022-07-25 NOTE — Progress Notes (Signed)
    S:    No chief complaint on file.  51 y.o. female who presents for diabetes evaluation, education, and management.  PMH is significant for T2DM, migraine without aura, diverticulitis with local perforation in the descending colon 04/2019. Patient was referred and last seen by Primary Care Provider, Dr. Laural Benes, on 05/10/2022. Pharmacy team saw her on 06/17/2022 and changed her Levemir to [employer]. We also increased her Humalog dose.   Today, patient arrives in good spirits and presents with the assistance of an online interpretor. Reports minimal change in BG readings since last visit, however, she has still been taking Levemir. She was not given pen needles to use with the Basaglar pen.   Patient reports Diabetes was diagnosed in 2020.   Current diabetes medications include: Basaglar 18u (still taking Levemir 15 units daily), insulin lispro 6 units BID, metformin 1000 mg BID Patient reports adherence to taking all medications as prescribed.   Insurance coverage: Uninsured   Patient denies hypoglycemic events.  Reported home fasting blood sugars: still 200s. Tells me that her blood sugar 250.  Reported 2 hour post-meal/random blood sugars: 300-400s  Patient reports nocturia (nighttime urination). 4-5x/night Patient reports neuropathy (nerve pain). Patient denies visual changes. Patient reports self foot exams.   Diet:  BF: eggs, coffee (black coffee), tortilla (corn - 2,3) Lunch: beef, salad Dinner: usually skips Sweets: none  Snacks: fruit (bananas, oranges) Drinks: no sodas, "green juice" occasionally, water   Physical activity:  20 minutes of walking daily   O:  Lab Results  Component Value Date   HGBA1C 11.0 (A) 05/10/2022   Lipid Panel     Component Value Date/Time   CHOL 196 01/08/2022 1105   TRIG 142 01/08/2022 1105   HDL 54 01/08/2022 1105   CHOLHDL 3.6 01/08/2022 1105   LDLCALC 117 (H) 01/08/2022 1105   Clinical Atherosclerotic Cardiovascular Disease  (ASCVD): No  The 10-year ASCVD risk score (Arnett DK, et al., 2019) is: 1.8%   Values used to calculate the score:     Age: 21 years     Sex: Female     Is Non-Hispanic African American: No     Diabetic: Yes     Tobacco smoker: No     Systolic Blood Pressure: 115 mmHg     Is BP treated: No     HDL Cholesterol: 54 mg/dL     Total Cholesterol: 196 mg/dL    A/P: Diabetes longstanding currently uncontrolled based on A1c (11%). Patient is able to verbalize appropriate hypoglycemia management plan. Medication adherence appears appropriate. Control is suboptimal due to needing insulin titration. -Discontinue Levemir.  -Started basal insulin Basaglar (insulin glargine) 22 units daily. Rx for pen needles sent.  -Increased dose of rapid insulin Humalog (insulin lispro) to 8 units BID. Counseled patient to take BEFORE MEALS -Continued metformin 1000 mg BID.  -Patient educated on purpose, proper use, and potential adverse effects of insulin.  -Extensively discussed pathophysiology of diabetes, recommended lifestyle interventions, dietary effects on blood sugar control.  -Counseled on s/sx of and management of hypoglycemia.  -Next A1c anticipated 07/2022.   Patient verbalized understanding of treatment plan.  Total time in face to face counseling 25 minutes.    Follow-up:  Pharmacist 1 month. PCP clinic visit on 09/10/2022.   Butch Penny, PharmD, Patsy [place], CPP Clinical Pharmacist [facility] [place] [phone]

## 2022-08-27 ENCOUNTER — Other Ambulatory Visit: Payer: Self-pay

## 2022-08-27 ENCOUNTER — Encounter: Payer: Self-pay | Admitting: Pharmacist

## 2022-08-27 ENCOUNTER — Ambulatory Visit: Payer: Self-pay | Attending: Internal Medicine | Admitting: Pharmacist

## 2022-08-27 DIAGNOSIS — Z794 Long term (current) use of insulin: Secondary | ICD-10-CM

## 2022-08-27 DIAGNOSIS — E119 Type 2 diabetes mellitus without complications: Secondary | ICD-10-CM

## 2022-08-27 LAB — POCT GLYCOSYLATED HEMOGLOBIN (HGB A1C): HbA1c, POC (controlled diabetic range): 12.5 % — AB (ref 0.0–7.0)

## 2022-08-27 MED ORDER — BASAGLAR KWIKPEN 100 UNIT/ML ~~LOC~~ SOPN
26.0000 [IU] | PEN_INJECTOR | Freq: Every day | SUBCUTANEOUS | 3 refills | Status: DC
  Filled 2022-08-27: qty 9, 34d supply, fill #0

## 2022-08-27 NOTE — Progress Notes (Signed)
S:    No chief complaint on file.  52 y.o. female who presents for diabetes evaluation, education, and management.  PMH is significant for T2DM, migraine without aura, diverticulitis with local perforation in the descending colon 04/2019. Patient was referred and last seen by Primary Care Provider, Dr. Wynetta Emery, on 05/10/2022. Pharmacy team saw her on 07/26/2023. Unfortunately, patient was found to be taking Levemir despite being prescribed Basaglar. She was never given pen needles. I switched her to [employer]. I also had to increase both doses of insulin as patient had persistent symptomatic hyperglycemia.   Today, patient arrives in good spirits and presents with the assistance of an online interpretor. Reports better BG control since last visit. She is now taking Basaglar + Humalog as prescribed. She is only taking metformin 1000 mg once daily d/t GI side effects.   Current diabetes medications include: Basaglar 22u, insulin lispro 8 units BID, metformin 1000 mg BID (taking once daily) Patient reports adherence to taking all medications as prescribed.   Insurance coverage: Uninsured   Patient denies hypoglycemic events.  Reported home fasting blood sugars: still mostly in the 200s since last visit, however, she tells me she is getting some readings in the 130s.  Reported 2 hour post-meal/random blood sugars: no longer seeing levels >300.    Since last visit, patient reports resolution of polyuria, polydipsia. Patient denies neuropathy (nerve pain). Patient denies visual changes. Will have blurred vision occasionally, but denies any changes related to diabetes. Patient reports self foot exams.   Diet:  BF: eggs, coffee (black coffee), tortilla (corn - 2,3) Lunch: beef, salad Dinner: usually skips Sweets: none  Snacks: since last visit, she now snacks on berries instead of bananas and citrus.  Drinks: no sodas, "green juice" occasionally, water   Physical activity:  20 minutes  of walking daily  Gets 15,000 steps at work  O:  Lab Results  Component Value Date   HGBA1C 11.0 (A) 05/10/2022   Lipid Panel     Component Value Date/Time   CHOL 196 01/08/2022 1105   TRIG 142 01/08/2022 1105   HDL 54 01/08/2022 1105   CHOLHDL 3.6 01/08/2022 1105   LDLCALC 117 (H) 01/08/2022 1105   Clinical Atherosclerotic Cardiovascular Disease (ASCVD): No  The 10-year ASCVD risk score (Arnett DK, et al., 2019) is: 1.8%   Values used to calculate the score:     Age: 35 years     Sex: Female     Is Non-Hispanic African American: No     Diabetic: Yes     Tobacco smoker: No     Systolic Blood Pressure: 409 mmHg     Is BP treated: No     HDL Cholesterol: 54 mg/dL     Total Cholesterol: 196 mg/dL    A/P: Diabetes longstanding currently uncontrolled based on A1c, however, home sugars are slowly improving. Patient is able to verbalize appropriate hypoglycemia management plan. Medication adherence appears appropriate. Control is suboptimal due to needing insulin titration. -Increase Basaglar (insulin glargine) to 26 units daily.  -Continue Humalog (insulin lispro) 8 units BID. Counseled patient to take 15 minutes BEFORE MEALS -Continued metformin 1000 mg BID.  -Patient educated on purpose, proper use, and potential adverse effects of insulin.  -Extensively discussed pathophysiology of diabetes, recommended lifestyle interventions, dietary effects on blood sugar control.  -Counseled on s/sx of and management of hypoglycemia.  -Next A1c anticipated 10/2022.   Patient verbalized understanding of treatment plan.  Total time in face to face  counseling 25 minutes.    Follow-up:  Pharmacist prn. PCP clinic visit on 09/10/2022.   Benard Halsted, PharmD, [place], [place] [phone]

## 2022-09-03 ENCOUNTER — Other Ambulatory Visit: Payer: Self-pay

## 2022-09-10 ENCOUNTER — Other Ambulatory Visit: Payer: Self-pay

## 2022-09-10 ENCOUNTER — Ambulatory Visit: Payer: Self-pay | Attending: Internal Medicine | Admitting: Internal Medicine

## 2022-09-10 ENCOUNTER — Encounter: Payer: Self-pay | Admitting: Internal Medicine

## 2022-09-10 VITALS — BP 109/76 | HR 71 | Temp 97.7°F | Ht <= 58 in | Wt 136.0 lb

## 2022-09-10 DIAGNOSIS — Z794 Long term (current) use of insulin: Secondary | ICD-10-CM

## 2022-09-10 DIAGNOSIS — Z1211 Encounter for screening for malignant neoplasm of colon: Secondary | ICD-10-CM

## 2022-09-10 DIAGNOSIS — E1165 Type 2 diabetes mellitus with hyperglycemia: Secondary | ICD-10-CM

## 2022-09-10 DIAGNOSIS — E782 Mixed hyperlipidemia: Secondary | ICD-10-CM

## 2022-09-10 DIAGNOSIS — L309 Dermatitis, unspecified: Secondary | ICD-10-CM

## 2022-09-10 MED ORDER — TRIAMCINOLONE ACETONIDE 0.1 % EX CREA
1.0000 | TOPICAL_CREAM | Freq: Two times a day (BID) | CUTANEOUS | 0 refills | Status: DC
  Filled 2022-09-10: qty 30, 15d supply, fill #0

## 2022-09-10 MED ORDER — METFORMIN HCL 1000 MG PO TABS
1000.0000 mg | ORAL_TABLET | Freq: Every day | ORAL | 1 refills | Status: DC
  Filled 2022-09-10: qty 90, 90d supply, fill #0

## 2022-09-10 MED ORDER — BASAGLAR KWIKPEN 100 UNIT/ML ~~LOC~~ SOPN
28.0000 [IU] | PEN_INJECTOR | Freq: Every day | SUBCUTANEOUS | 3 refills | Status: DC
  Filled 2022-09-10: qty 9, 32d supply, fill #0

## 2022-09-10 NOTE — Progress Notes (Signed)
Patient ID: Christie Martinez, female    DOB: 1971-01-15  MRN: [id number]  CC: Diabetes (DM f/u. Med refill - metformin/Itchiness on L side of neck, redness X3 days/Already received flu vax. )   Subjective: Christie Martinez is a 52 y.o. female who presents for chronic ds management Her concerns today include:  Patient with history of DM type II (dx 04/2019), HL, migraine without aura, diverticulitis with local perforation in the descending colon 04/2019.   DM: Lab Results  Component Value Date   HGBA1C 12.5 (A) 08/27/2022  When I saw her 04/2022 A1C was 11. We had her increase Levemir to 15 units, Humalog added 3 units with 2 largest meals of the day. Say clinical pharmacist a few times since then.  Changed to Basaglar insulin with current dose 26 units daily, Humalog 8 units with BF and lunch.  Suppose to be on Metformin 1 gram BID but taking only once a day in a.m because BID causes diarrhea.   Checks BS only once a day before BF with range 160-180 recently Feels she is doing okay with eating habits.  Staying away from sugary drinks.  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  Down 9 lbs since last visit with me Taking and tolerating Lipitor 40 mg daily.   Itching LT side of neck x 3 days No perfumes on the neck No costume jewelry around the neck  Patient Active Problem List   Diagnosis Date Noted   Migraine without aura and without status migrainosus, not intractable 06/15/2020   Hyperlipidemia associated with type 2 diabetes mellitus ([place]) 07/06/2019   Type 2 diabetes mellitus without complication, with long-term current use of insulin ([place]) 07/05/2019   History of diverticulitis 07/05/2019   Abnormal LFTs 07/05/2019   Diverticulitis of colon with perforation 05/06/2019     Current Outpatient Medications on File Prior to Visit  Medication Sig Dispense Refill   atorvastatin (LIPITOR) 40 MG tablet Take 1 tablet (40 mg total) by mouth  daily. 30 tablet 6   blood glucose meter kit and supplies KIT Dispense based on patient and insurance preference. Use up to four times daily as directed. (FOR ICD-9 250.00, 250.01). 1 each 0   glucose blood test strip Use as instructed to check blood sugar daily. 100 each 11   Insulin Glargine (BASAGLAR KWIKPEN) 100 UNIT/ML Inject 26 Units into the skin daily. 9 mL 3   insulin lispro (HUMALOG KWIKPEN) 100 UNIT/ML KwikPen Inject 8 Units into the skin 2 (two) times daily. 6 mL 2   Insulin Pen Needle (TRUEPLUS 5-BEVEL PEN NEEDLES) 32G X 4 MM MISC Use to inject Basaglar and Humalog. Max: 3 needles daily. 100 each 2   Insulin Syringe-Needle U-100 (INSULIN SYRINGE .5CC/31GX5/16") 31G X 5/16" 0.5 ML MISC Use to inject Levemir 15 units daily. 100 each 2   metFORMIN (GLUCOPHAGE) 1000 MG tablet Take 1 tablet (1,000 mg total) by mouth 2 (two) times daily with a meal. 60 tablet 5   TRUEplus Lancets 28G MISC Use to check blood sugar 3 times daily 100 each 2   topiramate (TOPAMAX) 25 MG tablet Take 1 tablet (25 mg total) by mouth at bedtime. (Patient not taking: Reported on 09/10/2022) 30 tablet 1   No current facility-administered medications on file prior to visit.    No Known Allergies  Social History   Socioeconomic History   Marital status: Married    Spouse name: Not on file  Number of children: 3   Years of education: Not on file   Highest education level: 9th grade  Occupational History   Not on file  Tobacco Use   Smoking status: Never   Smokeless tobacco: Never  Vaping Use   Vaping Use: Never used  Substance and Sexual Activity   Alcohol use: No   Drug use: Never   Sexual activity: Yes    Birth control/protection: Post-menopausal  Other Topics Concern   Not on file  Social History Narrative   Not on file   Social Determinants of Health   Financial Resource Strain: Not on file  Food Insecurity: No Food Insecurity (07/17/2021)   Hunger Vital Sign    Worried About Running Out  of Food in the Last Year: Never true    Ran Out of Food in the Last Year: Never true  Transportation Needs: No Transportation Needs (07/17/2021)   PRAPARE - Hydrologist (Medical): No    Lack of Transportation (Non-Medical): No  Physical Activity: Not on file  Stress: Not on file  Social Connections: Not on file  Intimate Partner Violence: Not on file    Family History  Problem Relation Age of Onset   Diabetes Father     No past surgical history on file.  ROS: Review of Systems Negative except as stated above  PHYSICAL EXAM: BP 109/76 (BP Location: Left Arm, Patient Position: Sitting, Cuff Size: Normal)   Pulse 71   Temp 97.7 F (36.5 C) (Oral)   Ht 4' 10"$  (1.473 m)   Wt 136 lb (61.7 kg)   LMP 11/12/2016   SpO2 99%   BMI 28.42 kg/m   Wt Readings from Last 3 Encounters:  09/10/22 136 lb (61.7 kg)  05/10/22 145 lb 6.4 oz (66 kg)  01/08/22 150 lb (68 kg)    Physical Exam  General appearance - alert, well appearing, and in no distress Mental status - normal mood, behavior, speech, dress, motor activity, and thought processes Neck - supple, no significant adenopathy Chest - clear to auscultation, no wheezes, rales or rhonchi, symmetric air entry Heart - normal rate, regular rhythm, normal S1, S2, no murmurs, rubs, clicks or gallops Extremities - peripheral pulses normal, no pedal edema, no clubbing or cyanosis Skin: About a 5 cm patch of flat mild erythema noted the anterior neckline above the sternal notch.     Latest Ref Rng & Units 05/10/2022   11:09 AM 01/10/2022    2:18 PM 01/08/2022   11:05 AM  CMP  Glucose 70 - 99 mg/dL 325  95    BUN 6 - 24 mg/dL 12  9    Creatinine 0.57 - 1.00 mg/dL 0.65  0.54    Sodium 134 - 144 mmol/L 137  140    Potassium 3.5 - 5.2 mmol/L 4.2  3.5    Chloride 96 - 106 mmol/L 97  105    CO2 20 - 29 mmol/L 24  24    Calcium 8.7 - 10.2 mg/dL 9.4  9.4    Total Protein 6.0 - 8.5 g/dL   6.7   Total Bilirubin  0.0 - 1.2 mg/dL   0.4   Alkaline Phos 44 - 121 IU/L   101   AST 0 - 40 IU/L   26   ALT 0 - 32 IU/L   30    Lipid Panel     Component Value Date/Time   CHOL 196 01/08/2022 1105   TRIG 142  01/08/2022 1105   HDL 54 01/08/2022 1105   CHOLHDL 3.6 01/08/2022 1105   LDLCALC 117 (H) 01/08/2022 1105    CBC    Component Value Date/Time   WBC 5.0 01/10/2022 1418   RBC 4.10 01/10/2022 1418   HGB 12.2 01/10/2022 1418   HGB 12.9 09/10/2021 1054   HCT 36.2 01/10/2022 1418   HCT 38.0 09/10/2021 1054   PLT 152 01/10/2022 1418   PLT 271 05/07/2021 1436   MCV 88.3 01/10/2022 1418   MCV 89 09/10/2021 1054   MCH 29.8 01/10/2022 1418   MCHC 33.7 01/10/2022 1418   RDW 12.2 01/10/2022 1418   RDW 12.4 09/10/2021 1054   LYMPHSABS 1.7 09/10/2021 1054   MONOABS 0.4 05/09/2019 0415   EOSABS 0.1 09/10/2021 1054   BASOSABS 0.0 09/10/2021 1054    ASSESSMENT AND PLAN: 1. Type 2 diabetes mellitus with hyperglycemia, with long-term current use of insulin (HCC) Fasting blood sugars still not at goal.  Recommend increase glargine insulin to 28 units daily.  Continue Humalog insulin 8 units with the 2 largest meals of the day.  I have change metformin to reflect that she is taking 1000 mg once a day. Encouraged her to continue healthy eating habits Challenged her to increase her walking to 30 minutes 4 to 5 days a week. Follow-up with clinical pharmacist in about 6 weeks.  Recommend that she checks blood sugars twice a day before breakfast and dinner and bring readings with her - Insulin Glargine (BASAGLAR KWIKPEN) 100 UNIT/ML; Inject 28 Units into the skin daily.  Dispense: 9 mL; Refill: 3 - metFORMIN (GLUCOPHAGE) 1000 MG tablet; Take 1 tablet (1,000 mg total) by mouth daily with breakfast.  Dispense: 90 tablet; Refill: 1  2. Mixed hyperlipidemia Continue atorvastatin.  3. Dermatitis Questionable etiology. - triamcinolone cream (KENALOG) 0.1 %; Apply 1 Application topically 2 (two) times daily.   Dispense: 30 g; Refill: 0  4. Screening for colon cancer Due for colon cancer screening.  Agreeable to doing fit test. - Fecal occult blood, imunochemical([facility])    [employer] Language interpreter used during this encounter. #Luis [id number]  Patient was given the opportunity to ask questions.  Patient verbalized understanding of the plan and was able to repeat key elements of the plan.   This documentation was completed using Radio producer.  Any transcriptional errors are unintentional.  No orders of the defined types were placed in this encounter.    Requested Prescriptions    No prescriptions requested or ordered in this encounter    No follow-ups on file.  Karle Plumber, MD, FACP

## 2022-09-15 LAB — FECAL OCCULT BLOOD, IMMUNOCHEMICAL: Fecal Occult Bld: NEGATIVE

## 2022-10-22 ENCOUNTER — Ambulatory Visit: Payer: Self-pay | Admitting: Pharmacist

## 2022-11-14 ENCOUNTER — Encounter: Payer: Self-pay | Admitting: Pharmacist

## 2022-11-14 ENCOUNTER — Ambulatory Visit: Payer: Self-pay | Attending: Internal Medicine | Admitting: Pharmacist

## 2022-11-14 ENCOUNTER — Other Ambulatory Visit: Payer: Self-pay

## 2022-11-14 DIAGNOSIS — Z794 Long term (current) use of insulin: Secondary | ICD-10-CM

## 2022-11-14 DIAGNOSIS — E785 Hyperlipidemia, unspecified: Secondary | ICD-10-CM

## 2022-11-14 DIAGNOSIS — E119 Type 2 diabetes mellitus without complications: Secondary | ICD-10-CM

## 2022-11-14 DIAGNOSIS — E1169 Type 2 diabetes mellitus with other specified complication: Secondary | ICD-10-CM

## 2022-11-14 DIAGNOSIS — E1165 Type 2 diabetes mellitus with hyperglycemia: Secondary | ICD-10-CM

## 2022-11-14 LAB — POCT GLYCOSYLATED HEMOGLOBIN (HGB A1C): HbA1c, POC (controlled diabetic range): 9 % — AB (ref 0.0–7.0)

## 2022-11-14 MED ORDER — ATORVASTATIN CALCIUM 40 MG PO TABS
40.0000 mg | ORAL_TABLET | Freq: Every day | ORAL | 1 refills | Status: DC
  Filled 2022-11-14: qty 90, 90d supply, fill #0

## 2022-11-14 MED ORDER — BASAGLAR KWIKPEN 100 UNIT/ML ~~LOC~~ SOPN
28.0000 [IU] | PEN_INJECTOR | Freq: Every day | SUBCUTANEOUS | 3 refills | Status: DC
  Filled 2022-11-14: qty 9, 32d supply, fill #0

## 2022-11-14 MED ORDER — TRUEPLUS LANCETS 28G MISC
2 refills | Status: DC
  Filled 2022-11-14: qty 100, 33d supply, fill #0

## 2022-11-14 MED ORDER — METFORMIN HCL 1000 MG PO TABS
1000.0000 mg | ORAL_TABLET | Freq: Every day | ORAL | 1 refills | Status: DC
Start: 2022-11-14 — End: 2023-01-20
  Filled 2022-11-14: qty 90, 90d supply, fill #0

## 2022-11-14 MED ORDER — GLUCOSE BLOOD VI STRP
ORAL_STRIP | 11 refills | Status: DC
  Filled 2022-11-14: qty 100, 33d supply, fill #0

## 2022-11-14 MED ORDER — INSULIN LISPRO (1 UNIT DIAL) 100 UNIT/ML (KWIKPEN)
8.0000 [IU] | PEN_INJECTOR | Freq: Two times a day (BID) | SUBCUTANEOUS | 2 refills | Status: DC
Start: 2022-11-14 — End: 2023-09-15
  Filled 2022-11-14: qty 6, 38d supply, fill #0

## 2022-11-14 NOTE — Progress Notes (Signed)
S:    No chief complaint on file.  52 y.o. female who presents for diabetes evaluation, education, and management.  PMH is significant for T2DM, migraine without aura, diverticulitis with local perforation in the descending colon 04/2019. Patient was referred and last seen by Primary Care Provider, Dr. Laural Benes, on 05/10/2022. Pharmacy team saw her on 08/27/2022 and A1c was 12.5% increased from 11%. At that visit, insulin glargine was increased from 22 to 26 units. Saw PCP on 09/10/2022 and reported blood glucose 160-180s and insulin glargine was increased to 28 units daily. Today, A1c had decreased to 9.0%.  Today, patient arrives in good spirits and presents with the assistance of an online interpretor Kathie Rhodes [phone]. Reports better BG control since last visit. She is only taking metformin 1000 mg once daily d/t GI side effects. Has noticed improvements with GI side effects since decreasing dose to once a day. She reports taking Basaglar 24 units daily and insulin lispro 8 units BID. She has cut yogurt out of her diet since it had sugar in it and was concerned about getting enough calcium. Discussed could take calcium and vitamin D supplements or enjoy [place] non-fat non-sweetened yogurt as an alternative to regular yogurt.  Current diabetes medications include: Basaglar 28 units (pt taking 24 units), insulin lispro 8 units BID, metformin 1000 mg QD Patient reports adherence to taking all medications.  Insurance coverage: Uninsured   Patient denies hypoglycemic events.  Reports she has not been able to check her blood sugars in about a week since she ran out of lancets. Reports blood glucose ~168, denies fasting BG <100 or >200. Does see BG>200 after eating.   Patient denies polyuria and polydipsia. Reports hasn't experienced in "a long time" Patient denies neuropathy (nerve pain). Patient denies visual changes. Will have blurred vision occasionally, but denies any changes related to  diabetes. Patient reports self foot exams.   Diet: Reports doctor told her to stop eating yogurt with sugar. Discussed Greek non-fat, non-sweetened yogurt may be better choice. BF: eggs, coffee (black coffee), tortilla (corn - 2,3) Lunch: beef, salad Dinner: usually skips Sweets: none  Snacks: snacks on berries instead of bananas and citrus.  Drinks: no sodas, "green juice" occasionally, water   Physical activity:  20 minutes of walking daily  Gets 15,000 steps at work  O:  Lab Results  Component Value Date   HGBA1C 9.0 (A) 11/14/2022   Lipid Panel     Component Value Date/Time   CHOL 196 01/08/2022 1105   TRIG 142 01/08/2022 1105   HDL 54 01/08/2022 1105   CHOLHDL 3.6 01/08/2022 1105   LDLCALC 117 (H) 01/08/2022 1105   Clinical Atherosclerotic Cardiovascular Disease (ASCVD): No  The 10-year ASCVD risk score (Arnett DK, et al., 2019) is: 1.6%   Values used to calculate the score:     Age: 52 years     Sex: Female     Is Non-Hispanic African American: No     Diabetic: Yes     Tobacco smoker: No     Systolic Blood Pressure: 109 mmHg     Is BP treated: No     HDL Cholesterol: 54 mg/dL     Total Cholesterol: 196 mg/dL    A/P: Diabetes longstanding currently uncontrolled based on A1c, however, home sugars are slowly improving. A1c trending down. Patient is able to verbalize appropriate hypoglycemia management plan. Medication adherence appears appropriate. Control is suboptimal due to needing insulin titration. -Instructed patient to take Basaglar (insulin glargine)  to 28 units daily as prescribed.  -Continued Humalog (insulin lispro) 8 units BID. Counseled patient to take 15 minutes BEFORE MEALS -Continued metformin 1000 mg QD.  -Patient educated on purpose, proper use, and potential adverse effects of insulin.  -Extensively discussed pathophysiology of diabetes, recommended lifestyle interventions, dietary effects on blood sugar control.  -Counseled on s/sx of and  management of hypoglycemia.  -Next A1c anticipated 01/2023.   Patient verbalized understanding of treatment plan.  Total time in face to face counseling 25 minutes.    Follow-up:  Pharmacist 1 month. PCP clinic visit June 2024.  Georga Hacking, Pharm.D PGY1 Pharmacy Resident 11/14/2022 3:49 PM

## 2022-12-17 ENCOUNTER — Ambulatory Visit: Payer: Self-pay | Admitting: Pharmacist

## 2022-12-20 ENCOUNTER — Ambulatory Visit: Payer: Self-pay | Attending: Internal Medicine | Admitting: Pharmacist

## 2022-12-20 ENCOUNTER — Encounter: Payer: Self-pay | Admitting: Pharmacist

## 2022-12-20 DIAGNOSIS — E1165 Type 2 diabetes mellitus with hyperglycemia: Secondary | ICD-10-CM

## 2022-12-20 DIAGNOSIS — Z794 Long term (current) use of insulin: Secondary | ICD-10-CM

## 2022-12-20 NOTE — Progress Notes (Signed)
S:    No chief complaint on file.  52 y.o. female who presents for diabetes evaluation, education, and management.  PMH is significant for T2DM, migraine without aura, diverticulitis with local perforation in the descending colon 04/2019. Patient was referred and last seen by Primary Care Provider, Dr. Laural Benes, on 09/10/2022. Pharmacy team saw her on 11/14/2022. WE did not make any changes at that visit. She was taking Basaglar at a dose less than what was prescribed. We had her take 28u as prescribed and continued her other meds.   Today, patient arrives in good spirits and presents with the assistance of an online interpretor [place] [phone]. Reports better BG control since last visit. She is only taking metformin 1000 mg once daily d/t GI side effects. Has noticed improvements with GI side effects since decreasing dose to once a day. She reports taking Basaglar 28 units daily as prescribed and insulin lispro 8 units BID. Sometimes she will only take her Humalog once daily in the morning due to her work schedule. She has no complaints today.   Current diabetes medications include: Basaglar 28 units, Humalog 8 units BID (only taking once in the morning, metformin 1000 mg daily Patient reports adherence to taking all medications.  Insurance coverage: Uninsured   Patient denies hypoglycemic events.  Reports CBGs as follows:  AM, fasting: 120 - 137 PM, post-prandial/random: 210 right after she eats   Patient denies polyuria and polydipsia. Reports hasn't experienced in "a long time" Patient denies neuropathy (nerve pain). Patient denies visual changes. Will have blurred vision occasionally, but denies any changes related to diabetes. Patient reports self foot exams.   Diet: Reports doctor told her to stop eating yogurt with sugar. Discussed Greek non-fat, non-sweetened yogurt may be better choice. BF: eggs, coffee (black coffee), tortilla (corn - 2,3) Lunch: beef, salad Dinner: usually  skips Sweets: none - names that she is limiting sugars  Snacks: snacks on berries instead of bananas and citrus.  Drinks: no sodas, "green juice" occasionally, water   Physical activity:  20 minutes of walking daily  Gets 15,000 steps at work  O:  Lab Results  Component Value Date   HGBA1C 9.0 (A) 11/14/2022   Lipid Panel     Component Value Date/Time   CHOL 196 01/08/2022 1105   TRIG 142 01/08/2022 1105   HDL 54 01/08/2022 1105   CHOLHDL 3.6 01/08/2022 1105   LDLCALC 117 (H) 01/08/2022 1105   Clinical Atherosclerotic Cardiovascular Disease (ASCVD): No  The 10-year ASCVD risk score (Arnett DK, et al., 2019) is: 1.7%   Values used to calculate the score:     Age: 36 years     Sex: Female     Is Non-Hispanic African American: No     Diabetic: Yes     Tobacco smoker: No     Systolic Blood Pressure: 109 mmHg     Is BP treated: No     HDL Cholesterol: 54 mg/dL     Total Cholesterol: 196 mg/dL    A/P: Diabetes longstanding currently uncontrolled based on A1c, however, home sugars are close to goal. A1c trending down. Patient is able to verbalize appropriate hypoglycemia management plan. Medication adherence appears appropriate. Control is suboptimal due to needing insulin titration. -Continue current regimen. -Patient educated on purpose, proper use, and potential adverse effects of insulin.  -Extensively discussed pathophysiology of diabetes, recommended lifestyle interventions, dietary effects on blood sugar control.  -Counseled on s/sx of and management of hypoglycemia.  -Next  A1c anticipated 01/2023.   Patient verbalized understanding of treatment plan.  Total time in face to face counseling 25 minutes.    Follow-up:  PCP 1 month. Pharmacist in July.  Butch Penny, PharmD, Patsy [place], CPP Clinical Pharmacist [facility] [phone]

## 2022-12-22 ENCOUNTER — Other Ambulatory Visit: Payer: Self-pay

## 2022-12-22 ENCOUNTER — Emergency Department (HOSPITAL_BASED_OUTPATIENT_CLINIC_OR_DEPARTMENT_OTHER): Payer: Self-pay

## 2022-12-22 ENCOUNTER — Encounter (HOSPITAL_BASED_OUTPATIENT_CLINIC_OR_DEPARTMENT_OTHER): Payer: Self-pay

## 2022-12-22 ENCOUNTER — Emergency Department (HOSPITAL_BASED_OUTPATIENT_CLINIC_OR_DEPARTMENT_OTHER)
Admission: EM | Admit: 2022-12-22 | Discharge: 2022-12-22 | Disposition: A | Discharge status: Home / Self Care | Payer: Self-pay | Attending: Emergency Medicine | Admitting: Emergency Medicine

## 2022-12-22 DIAGNOSIS — Z7984 Long term (current) use of oral hypoglycemic drugs: Secondary | ICD-10-CM | POA: Insufficient documentation

## 2022-12-22 DIAGNOSIS — Z794 Long term (current) use of insulin: Secondary | ICD-10-CM | POA: Insufficient documentation

## 2022-12-22 DIAGNOSIS — K5792 Diverticulitis of intestine, part unspecified, without perforation or abscess without bleeding: Secondary | ICD-10-CM

## 2022-12-22 DIAGNOSIS — E119 Type 2 diabetes mellitus without complications: Secondary | ICD-10-CM | POA: Insufficient documentation

## 2022-12-22 DIAGNOSIS — K5732 Diverticulitis of large intestine without perforation or abscess without bleeding: Secondary | ICD-10-CM | POA: Insufficient documentation

## 2022-12-22 LAB — LIPASE, BLOOD: Lipase: <10 U/L — ABNORMAL LOW (ref 11–51)

## 2022-12-22 LAB — URINALYSIS, ROUTINE W REFLEX MICROSCOPIC
Bacteria, UA: NONE SEEN
Bilirubin Urine: NEGATIVE
Glucose, UA: 500 mg/dL — AB
Hgb urine dipstick: NEGATIVE
Nitrite: NEGATIVE
Protein, ur: NEGATIVE mg/dL
Specific Gravity, Urine: 1.015 (ref 1.005–1.030)
pH: 6 (ref 5.0–8.0)

## 2022-12-22 LAB — CBC
HCT: 38.1 % (ref 36.0–46.0)
Hemoglobin: 13.4 g/dL (ref 12.0–15.0)
MCH: 29.9 pg (ref 26.0–34.0)
MCHC: 35.2 g/dL (ref 30.0–36.0)
MCV: 85 fL (ref 80.0–100.0)
Platelets: 233 10*3/uL (ref 150–400)
RBC: 4.48 MIL/uL (ref 3.87–5.11)
RDW: 12.1 % (ref 11.5–15.5)
WBC: 9.5 10*3/uL (ref 4.0–10.5)
nRBC: 0 % (ref 0.0–0.2)

## 2022-12-22 LAB — COMPREHENSIVE METABOLIC PANEL
ALT: 18 U/L (ref 0–44)
AST: 15 U/L (ref 15–41)
Albumin: 4.8 g/dL (ref 3.5–5.0)
Alkaline Phosphatase: 92 U/L (ref 38–126)
Anion gap: 13 (ref 5–15)
BUN: 9 mg/dL (ref 6–20)
CO2: 23 mmol/L (ref 22–32)
Calcium: 9.3 mg/dL (ref 8.9–10.3)
Chloride: 100 mmol/L (ref 98–111)
Creatinine, Ser: 0.48 mg/dL (ref 0.44–1.00)
GFR, Estimated: >60 mL/min (ref >60)
Glucose, Bld: 194 mg/dL — ABNORMAL HIGH (ref 70–99)
Potassium: 3.4 mmol/L — ABNORMAL LOW (ref 3.5–5.1)
Sodium: 136 mmol/L (ref 135–145)
Total Bilirubin: 0.8 mg/dL (ref 0.3–1.2)
Total Protein: 7.8 g/dL (ref 6.5–8.1)

## 2022-12-22 LAB — PREGNANCY, URINE: Preg Test, Ur: NEGATIVE

## 2022-12-22 MED ORDER — HYDROCODONE-ACETAMINOPHEN 5-325 MG PO TABS: 2.0000 | ORAL_TABLET | ORAL | 0 refills | Status: DC | PRN

## 2022-12-22 MED ORDER — AMOXICILLIN-POT CLAVULANATE 875-125 MG PO TABS
1.0000 | ORAL_TABLET | Freq: Once | ORAL | Status: AC
  Administered 2022-12-22: 1 via ORAL
  Filled 2022-12-22: qty 1

## 2022-12-22 MED ORDER — HYDROMORPHONE HCL 1 MG/ML IJ SOLN
1.0000 mg | Freq: Once | INTRAMUSCULAR | Status: AC
  Administered 2022-12-22: 1 mg via INTRAVENOUS
  Filled 2022-12-22: qty 1

## 2022-12-22 MED ORDER — ONDANSETRON HCL 4 MG/2ML IJ SOLN
4.0000 mg | Freq: Once | INTRAMUSCULAR | Status: AC
  Administered 2022-12-22: 4 mg via INTRAVENOUS
  Filled 2022-12-22: qty 2

## 2022-12-22 MED ORDER — IOHEXOL 300 MG/ML  SOLN
100.0000 mL | Freq: Once | INTRAMUSCULAR | Status: AC | PRN
  Administered 2022-12-22: 100 mL via INTRAVENOUS

## 2022-12-22 MED ORDER — SODIUM CHLORIDE 0.9 % IV BOLUS
1000.0000 mL | Freq: Once | INTRAVENOUS | Status: AC
  Administered 2022-12-22: 1000 mL via INTRAVENOUS

## 2022-12-22 MED ORDER — AMOXICILLIN-POT CLAVULANATE 875-125 MG PO TABS: 1.0000 | ORAL_TABLET | Freq: Two times a day (BID) | ORAL | 0 refills | Status: DC

## 2022-12-22 NOTE — ED Provider Notes (Cosign Needed Addendum)
[place] [facility] AT [facility] Provider Note   CSN: [id number] Arrival date & time: 12/22/22  1901     History  Chief Complaint  Patient presents with   Abdominal Pain   HPI Christie Martinez is a 52 y.o. female with DM and hyperlipidemia presenting for abdominal pain.  Started yesterday.  Located in the left lower quadrant.  Feels sharp stabbing pain it is nonradiating.  Endorses nausea and vomiting.  No blood in the vomit.  Denies diarrhea.  States had a normal bowel movement without blood around noon today.  Endorses subjective fever at home.  Denies urinary changes.   Abdominal Pain      Home Medications Prior to Admission medications   Medication Sig Start Date End Date Taking? Authorizing Provider  amoxicillin-clavulanate (AUGMENTIN) 875-125 MG tablet Take 1 tablet by mouth every 12 (twelve) hours. 12/22/22  Yes Gareth Eagle, PA-C  HYDROcodone-acetaminophen (NORCO/VICODIN) 5-325 MG tablet Take 2 tablets by mouth every 4 (four) hours as needed. 12/22/22  Yes Gareth Eagle, PA-C  atorvastatin (LIPITOR) 40 MG tablet Take 1 tablet (40 mg total) by mouth daily. 11/14/22   Marcine Matar, MD  blood glucose meter kit and supplies KIT Dispense based on patient and insurance preference. Use up to four times daily as directed. (FOR ICD-9 250.00, 250.01). 05/10/19   Glade Lloyd, MD  glucose blood test strip Use as instructed to check blood sugar daily. 11/14/22   Marcine Matar, MD  Insulin Glargine [facility] KWIKPEN) 100 UNIT/ML Inject 28 Units into the skin daily. 11/14/22   Marcine Matar, MD  insulin lispro (HUMALOG KWIKPEN) 100 UNIT/ML KwikPen Inject 8 Units into the skin 2 (two) times daily. 11/14/22   Marcine Matar, MD  Insulin Pen Needle (TRUEPLUS 5-BEVEL PEN NEEDLES) 32G X 4 MM MISC Use to inject Basaglar and Humalog. Max: 3 needles daily. 07/25/22   Marcine Matar, MD  Insulin Syringe-Needle U-100 (INSULIN SYRINGE  .5CC/31GX5/16") 31G X 5/16" 0.5 ML MISC Use to inject Levemir 15 units daily. 06/16/20   Marcine Matar, MD  metFORMIN (GLUCOPHAGE) 1000 MG tablet Take 1 tablet (1,000 mg total) by mouth daily with breakfast. 11/14/22   Marcine Matar, MD  topiramate (TOPAMAX) 25 MG tablet Take 1 tablet (25 mg total) by mouth at bedtime. Patient not taking: Reported on 09/10/2022 06/15/20   Marcine Matar, MD  triamcinolone cream (KENALOG) 0.1 % Apply 1 Application topically 2 (two) times daily. 09/10/22   Marcine Matar, MD  TRUEplus Lancets 28G MISC Use to check blood sugar 3 times daily 11/14/22   Marcine Matar, MD      Allergies    Patient has no known allergies.    Review of Systems   Review of Systems  Gastrointestinal:  Positive for abdominal pain.    Physical Exam   Vitals:   12/22/22 2200 12/22/22 2215  BP: 119/75 111/76  Pulse: 82 83  Resp:  17  Temp:    SpO2: 100% 99%    CONSTITUTIONAL:  well-appearing, NAD NEURO:  Alert and oriented x 3, CN 3-12 grossly intact EYES:  eyes equal and reactive ENT/NECK:  Supple, no stridor  CARDIO:  Regular rate and rhythm, appears well-perfused  PULM:  No respiratory distress, CTAB GI/GU:  non-distended, soft, left lower quadrant tenderness MSK/SPINE:  No gross deformities, no edema, moves all extremities  SKIN:  no rash, atraumatic  *Additional and/or pertinent findings included in MDM below  ED  Results / Procedures / Treatments   Labs (all labs ordered are listed, but only abnormal results are displayed) Labs Reviewed  LIPASE, BLOOD - Abnormal; Notable for the following components:      Result Value   Lipase <10 (*)    All other components within normal limits  COMPREHENSIVE METABOLIC PANEL - Abnormal; Notable for the following components:   Potassium 3.4 (*)    Glucose, Bld 194 (*)    All other components within normal limits  URINALYSIS, ROUTINE W REFLEX MICROSCOPIC - Abnormal; Notable for the following components:    Glucose, UA 500 (*)    Ketones, ur TRACE (*)    Leukocytes,Ua TRACE (*)    All other components within normal limits  CBC  PREGNANCY, URINE    EKG None  Radiology CT ABDOMEN PELVIS W CONTRAST  Result Date: 12/22/2022 CLINICAL DATA:  Abdominal pain, acute, nonlocalized. Left lower quadrant pain. EXAM: CT ABDOMEN AND PELVIS WITH CONTRAST TECHNIQUE: Multidetector CT imaging of the abdomen and pelvis was performed using the standard protocol following bolus administration of intravenous contrast. RADIATION DOSE REDUCTION: This exam was performed according to the departmental dose-optimization program which includes automated exposure control, adjustment of the mA and/or kV according to patient size and/or use of iterative reconstruction technique. CONTRAST:  OMNIPAQUE IOHEXOL 300 MG/ML  SOLN COMPARISON:  08/11/2019 FINDINGS: Lower chest: No acute abnormality Hepatobiliary: No focal hepatic abnormality. Gallbladder unremarkable. Pancreas: No focal abnormality or ductal dilatation. Spleen: No focal abnormality.  Normal size. Adrenals/Urinary Tract: No adrenal abnormality. No focal renal abnormality. No stones or hydronephrosis. Urinary bladder is unremarkable. Stomach/Bowel: Normal appendix. Scattered sigmoid diverticula. Wall thickening and surrounding inflammation noted in the proximal sigmoid colon compatible with active diverticulitis. Stomach and small bowel decompressed, unremarkable. Vascular/Lymphatic: No evidence of aneurysm or adenopathy. Reproductive: Uterus and adnexa unremarkable.  No mass. Other: No free fluid or free air. Musculoskeletal: No acute bony abnormality. IMPRESSION: Sigmoid diverticulosis with inflammatory stranding around the proximal sigmoid colon compatible with active diverticulitis. Electronically Signed   By: Charlett Nose M.D.   On: 12/22/2022 21:19    Procedures Procedures    Medications Ordered in ED Medications  iohexol (OMNIPAQUE) 300 MG/ML solution 100 mL  (100 mLs Intravenous Contrast Given 12/22/22 2104)  HYDROmorphone (DILAUDID) injection 1 mg (1 mg Intravenous Given 12/22/22 2135)  sodium chloride 0.9 % bolus 1,000 mL (0 mLs Intravenous Stopped 12/22/22 2305)  ondansetron (ZOFRAN) injection 4 mg (4 mg Intravenous Given 12/22/22 2135)  amoxicillin-clavulanate (AUGMENTIN) 875-125 MG per tablet 1 tablet (1 tablet Oral Given 12/22/22 2216)    ED Course/ Medical Decision Making/ A&P Clinical Course as of 12/22/22 2309  Sun Dec 22, 2022  2243 BP: 111/76 [JR]    Clinical Course User Index [JR] Gareth Eagle, PA-C                             Medical Decision Making Amount and/or Complexity of Data Reviewed Labs: ordered. Radiology: ordered.  Risk Prescription drug management.   Initial Impression and Ddx 52 year old well-appearing female presenting for abdominal pain.  Exam notable for left lower quadrant tenderness.  DDx includes diverticulitis, appendicitis, nephrolithiasis, pyelonephritis Patient PMH that increases complexity of ED encounter:  DM and hyperlipidemia  Interpretation of Diagnostics - I independent reviewed and interpreted the labs as followed: Hyperglycemia  - I independently visualized the following imaging with scope of interpretation limited to determining acute life threatening conditions related to  emergency care: CT abdomen pelvis, which revealed diverticulitis   Patient Reassessment and Ultimate Disposition/Management CT scan revealed concern for diverticulitis which is likely etiology of her symptoms. Treated her pain and nausea. States she felt much better. Treated diverticulitis with Augmentin. Overall patient appears clinically well without a leukocytosis and low-grade fever and pain now well-controlled. Suspect overall does have mild case of diverticulitis.  Tolerated PO challenge. Able to ambulate around the department without issue. Advised to continue Augmentin and to follow-up with GI. Also sent Norco for  acute pain.  Discussed pertinent return precautions.  Vital stable at discharge.  Discharged home.  Patient management required discussion with the following services or consulting groups:  None  Complexity of Problems Addressed Acute complicated illness or Injury  Additional Data Reviewed and Analyzed Further history obtained from: Past medical history and medications listed in the EMR, Prior ED visit notes, and Recent discharge summary  Patient Encounter Risk Assessment Prescriptions         Final Clinical Impression(s) / ED Diagnoses Final diagnoses:  Diverticulitis    Rx / DC Orders ED Discharge Orders          Ordered    amoxicillin-clavulanate (AUGMENTIN) 875-125 MG tablet  Every 12 hours        12/22/22 2300    HYDROcodone-acetaminophen (NORCO/VICODIN) 5-325 MG tablet  Every 4 hours PRN        12/22/22 2308              Gareth Eagle, PA-C 12/22/22 2302    Rexford Maus, DO 12/22/22 2305    Gareth Eagle, PA-C 12/22/22 2309    Elayne Snare K, DO 12/25/22 [phone]

## 2022-12-22 NOTE — Discharge Instructions (Addendum)
Evaluation of your abdominal pain revealed that you do have diverticulitis this is likely causing her symptoms.  I have started you on Augmentin which is an antibiotic to cover for the infection.  Recommend you follow-up with GI for your ongoing abdominal infection.  I provided their contact information in your discharge summary.  If you have worsening abdominal pain, worsening nausea vomiting diarrhea, inability to tolerate fluid intake, blood in the stool or any other concerning symptom please return emergency department further evaluation.

## 2022-12-22 NOTE — ED Notes (Signed)
RN reviewed discharge instructions with pt. Pt verbalized understanding and had no further questions. VSS upon discharge.  

## 2022-12-22 NOTE — ED Notes (Signed)
Pt ambulated successfully in hallway

## 2022-12-22 NOTE — ED Triage Notes (Signed)
Via interpreter [phone] "I have a lot of stabbing pain on LLQ abd, nausea, a little vomiting, HA, & a little fever onset yesterday- worsening over the last day. No meds PTA, not sure what to take."  LMP "a few years ago"

## 2022-12-22 NOTE — ED Notes (Signed)
Pt tolerating water at this time.

## 2022-12-22 NOTE — ED Notes (Signed)
Pt reports increased dizziness upon standing, assisted back to bed by this RN. Roxan Hockey PA made aware

## 2023-01-20 ENCOUNTER — Other Ambulatory Visit: Payer: Self-pay

## 2023-01-20 ENCOUNTER — Encounter: Payer: Self-pay | Admitting: Internal Medicine

## 2023-01-20 ENCOUNTER — Ambulatory Visit: Payer: Self-pay | Attending: Internal Medicine | Admitting: Internal Medicine

## 2023-01-20 VITALS — BP 103/69 | HR 67 | Temp 97.7°F | Ht <= 58 in | Wt 140.0 lb

## 2023-01-20 DIAGNOSIS — E785 Hyperlipidemia, unspecified: Secondary | ICD-10-CM

## 2023-01-20 DIAGNOSIS — E1169 Type 2 diabetes mellitus with other specified complication: Secondary | ICD-10-CM

## 2023-01-20 DIAGNOSIS — Z23 Encounter for immunization: Secondary | ICD-10-CM

## 2023-01-20 DIAGNOSIS — E1165 Type 2 diabetes mellitus with hyperglycemia: Secondary | ICD-10-CM

## 2023-01-20 DIAGNOSIS — Z794 Long term (current) use of insulin: Secondary | ICD-10-CM

## 2023-01-20 MED ORDER — ZOSTER VAC RECOMB ADJUVANTED 50 MCG/0.5ML IM SUSR
0.5000 mL | Freq: Once | INTRAMUSCULAR | 0 refills | Status: AC
Start: 2023-01-20 — End: 2023-01-21
  Filled 2023-01-20: qty 0.5, 1d supply, fill #0

## 2023-01-20 MED ORDER — METFORMIN HCL 1000 MG PO TABS
1000.0000 mg | ORAL_TABLET | Freq: Every day | ORAL | 1 refills | Status: DC
Start: 2023-01-20 — End: 2023-07-15
  Filled 2023-01-20: qty 90, 90d supply, fill #0

## 2023-01-20 NOTE — Patient Instructions (Signed)
Alimentacin saludable en los adultos Healthy Eating, Adult Una alimentacin saludable puede ayudarlo a alcanzar y mantener un peso saludable, reducir el riesgo de tener enfermedades crnicas y vivir una vida larga y productiva. Es importante que siga una modalidad de alimentacin saludable. Sus necesidades nutricionales y calricas deben satisfacerse principalmente con distintos alimentos ricos en nutrientes. Consejos para seguir este plan Lea las etiquetas de los alimentos Lea las etiquetas y elija las que digan lo siguiente: Productos reducidos en sodio o con bajo contenido de sodio. Jugos con 100 % jugo de fruta. Alimentos con bajo contenido de grasas saturadas (menos de 3 g por porcin) y alto contenido de grasas poliinsaturadas y monoinsaturadas. Alimentos con cereales integrales, como trigo integral, trigo partido, arroz integral y arroz salvaje. Cereales integrales fortificados con cido flico. Esto se recomienda a las mujeres embarazadas o que desean quedar embarazadas. Lea las etiquetas y no coma ni beba lo siguiente: Alimentos o bebidas con azcar agregada. Estos incluyen los alimentos que contienen azcar moreno, endulzante a base de maz, jarabe de maz, dextrosa, fructosa, glucosa, jarabe de maz de alta fructosa, miel, azcar invertido, lactosa, jarabe de malta, maltosa, melaza, azcar sin refinar, sacarosa, trehalosa y azcar turbinado. Limite el consumo de azcar agregada a menos del 10 % del total de caloras diarias. No consuma ms que las siguientes cantidades de azcar agregada por da: 6 cucharaditas (25 g) para las mujeres. 9 cucharaditas (38 g) para los hombres. Los alimentos que contienen almidones y cereales refinados o procesados. Los productos de cereales refinados, como harina blanca, harina de maz desgerminada, pan blanco y arroz blanco. Al ir de compras Elija refrigerios ricos en nutrientes, como verduras, frutas enteras y frutos secos. Evite los refrigerios con  alto contenido de caloras y azcar, como las papas fritas, los refrigerios frutales y los caramelos. Use alios y productos para untar a base de aceite con los alimentos en lugar de grasas slidas como la mantequilla, la margarina, la crema agria o el queso crema. Limite las salsas, las mezclas y los productos "instantneos" preelaborados como el arroz saborizado, los fideos instantneos y las pastas listas para comer. Pruebe ms fuentes de protena vegetal, como tofu, tempeh, frijoles negros, edamame, lentejas, frutos secos y semillas. Explore planes de alimentacin como la dieta mediterrnea o la dieta vegetariana. Pruebe salsas cardiosaludables hechas con frijoles y grasas saludables, como hummus y guacamole. Las verduras van muy bien con ellas. Al cocinar Use aceite para saltear los alimentos en lugar de grasas slidas como mantequilla, margarina o manteca de cerdo. En lugar de frer, trate de cocinar en el horno, en la plancha o en la parrilla, o hervir los alimentos. Retire la parte grasa de las carnes antes de cocinarlas. Cocine las verduras al vapor en agua o caldo. Planificacin de las comidas  En las comidas, imagine dividir su plato en cuartos: La mitad del plato tiene frutas y verduras. Un cuarto del plato tiene cereales integrales. Un cuarto del plato tiene protena, especialmente carnes magras, aves, huevos, tofu, frijoles o frutos secos. Incluya lcteos descremados en su dieta diaria. Estilo de vida Elija opciones saludables en todos los mbitos, como en el hogar, el trabajo, la escuela, los restaurantes y las tiendas. Prepare los alimentos de un modo seguro: Lvese las manos despus de manipular carnes crudas. Donde prepare alimentos, mantenga las superficies limpias lavndolas regularmente con agua caliente y jabn. Mantenga las carnes crudas separadas de los alimentos que estn listos para comer como las frutas y las verduras. Cocine los frutos   de mar, carnes, aves y huevos  hasta alcanzar la temperatura recomendada. Consiga un termmetro para alimentos. Almacene los alimentos a temperaturas seguras. En general: Mantenga los alimentos fros a una temperatura de 40 F (4,4 C) o inferior. Mantenga los alimentos calientes a una temperatura de 140 F (60 C) o superior. Mantenga el congelador a una temperatura de 0 F (-17,8 C) o inferior. Los alimentos no son seguros para su consumo cuando han estado a una temperatura de entre 40 y 140 F (4.4 y 60 C) por ms de 2 horas. Qu alimentos debo comer? Frutas Propngase comer entre 1 y 2 tazas de frutas frescas, enlatadas (en su jugo natural) o congeladas cada da. Una taza de fruta equivale a 1 manzana pequea, 1 banana grande, 8 fresas grandes, 1 taza (237 g) de fruta enlatada,  taza (82 g) de fruta seca o 1 taza (240 ml) de jugo al 100 %. Verduras Propngase comer de 2 a 4 tazas de verduras frescas y congeladas cada da, incluyendo diferentes variedades y colores. Una taza de verduras equivale a 1 taza (91 g) de brcoli o coliflor, 2 zanahorias medianas, 2 tazas (150 g) de verduras de hoja verde crudas, 1 tomate grande, 1 pimiento morrn grande, 1 batata grande o 1 patata blanca mediana. Cereales Propngase comer el equivalente a entre 4 y 10 onzas de cereales integrales por da. Algunos ejemplos de equivalentes a 1 onza de cereales son 1 rebanada de pan, 1 taza (40 g) de cereal listo para comer, 3 tazas (24 g) de palomitas de maz o  taza (93 g) de arroz cocido. Carnes y otras protenas Propngase comer el equivalente a entre 5 y 7  onzas de protena por da. Algunos ejemplos de equivalentes a 1 onza de protenas incluyen 1 huevo,  oz de frutos secos (12 almendras, 24 pistachos o 7 mitades de nueces), 1/4 taza (90 g) de frijoles cocidos, 6 cucharadas (90 g) de hummus o 1 cucharada (16 g) de mantequilla de man. Un corte de carne o pescado del tamao de un mazo de cartas equivale aproximadamente a 3 a 4 onzas (85  g). De las protenas que consume cada semana, intente que al menos 8 onzas (227 g) sean frutos de mar. Esto equivale a unas 2 porciones por semana. Esto incluye salmn, trucha, arenque y anchoas. Lcteos Propngase comer el equivalente a 3 tazas de lcteos descremados o con bajo contenido de grasa cada da. Algunos ejemplos de equivalentes a 1 taza de lcteos son 1 taza (240 ml) de leche, 8 onzas (250 g) de yogur, 1 onzas (44 g) de queso natural o 1 taza (240 ml) de leche de soja fortificada. Grasas y aceites Propngase consumir alrededor de 5 cucharaditas (21 g) de grasas y aceites por da. Elija grasas monoinsaturadas, como el aceite de canola y de oliva, la mayonesa hecha con aceite de oliva o de aguacate, aguacates, mantequilla de man y la mayora de los frutos secos, o bien grasas poliinsaturadas, como el aceite de girasol, maz y soja, nueces, piones, semillas de ssamo, semillas de girasol y semillas de lino. Bebidas Propngase beber 6 vasos de 8 onzas de agua por da. Limite el caf a entre 3 y 5 tazas de ocho onzas por da. Limite el consumo de bebidas con cafena que tengan caloras agregadas, como los refrescos y las bebidas energizantes. Si bebe alcohol: Limite la cantidad que bebe a lo siguiente: De 0 a 1 medida al da si es mujer. De 0 a 2 medidas   al da si es varn. Sepa cunta cantidad de alcohol hay en las bebidas que toma. En los [place], una medida es una botella de cerveza de 12 oz (355 ml), un vaso de vino de 5 oz (148 ml) o un vaso de una bebida alcohlica de alta graduacin de 1 oz (44 ml). Condimentos y otros alimentos Trate de no agregar demasiada sal a los alimentos. Trate de usar hierbas y especias en lugar de sal. Trate de no agregar azcar a los alimentos. Esta informacin se basa en las pautas de nutricin de los [place] Para obtener ms informacin, visite choosemyplate.gov. Las cantidades exactas pueden variar. Es posible que necesite cantidades  diferentes. Esta informacin no tiene como fin reemplazar el consejo del mdico. Asegrese de hacerle al mdico cualquier pregunta que tenga. Document Revised: 05/14/2022 Document Reviewed: 05/14/2022 Elsevier Patient Education  2024 [employer]  

## 2023-01-20 NOTE — Progress Notes (Signed)
Patient ID: Christie Martinez, female    DOB: 1971/07/10  MRN: [id number]  CC: Diabetes (DM f/u. Med refill./Pt had ER visit on 12/22/2022 )   Subjective: Christie Martinez is a 52 y.o. female who presents for chronic ds management Her concerns today include:  Patient with history of DM type II (dx 04/2019), HL, migraine without aura, diverticulitis with local perforation in the descending colon 04/2019.   [employer] Language interpreter used during this encounter. #[phone] Pax  DM: Lab Results  Component Value Date   HGBA1C 9.0 (A) 11/14/2022  Saw clinical pharmacist 12/20/2022.  A1C had improved from 12.5 to 9. Checks BS daily in the a.m.  gives range 111-134 Confirms taking Glargine 28 units daily, Humalog 8 units with breakfast (suppose to be twice a day with the 2 largest meals of the day.  Works at [employer] and fridge there not working properly) and Metformin 1 gram daily Doing okay with eating habits Due for DM eye exam.  No insurance. No numbness in feet. Walking several days a wk for 30 mins and does some wgh lifting Taking and tolerating Lipitor Seen in ER end of May for flare of diverticulosis.    HM:  due for shingrix Patient Active Problem List   Diagnosis Date Noted   Migraine without aura and without status migrainosus, not intractable 06/15/2020   Hyperlipidemia associated with type 2 diabetes mellitus (HCC) 07/06/2019   Type 2 diabetes mellitus without complication, with long-term current use of insulin (HCC) 07/05/2019   History of diverticulitis 07/05/2019   Abnormal LFTs 07/05/2019   Diverticulitis of colon with perforation 05/06/2019     Current Outpatient Medications on File Prior to Visit  Medication Sig Dispense Refill   atorvastatin (LIPITOR) 40 MG tablet Take 1 tablet (40 mg total) by mouth daily. 90 tablet 1   blood glucose meter kit and supplies KIT Dispense based on patient and insurance preference. Use up to four times daily as  directed. (FOR ICD-9 250.00, 250.01). 1 each 0   glucose blood test strip Use as instructed to check blood sugar daily. 100 each 11   HYDROcodone-acetaminophen (NORCO/VICODIN) 5-325 MG tablet Take 2 tablets by mouth every 4 (four) hours as needed. 8 tablet 0   Insulin Glargine (BASAGLAR KWIKPEN) 100 UNIT/ML Inject 28 Units into the skin daily. 9 mL 3   insulin lispro (HUMALOG KWIKPEN) 100 UNIT/ML KwikPen Inject 8 Units into the skin 2 (two) times daily. 6 mL 2   Insulin Pen Needle (TRUEPLUS 5-BEVEL PEN NEEDLES) 32G X 4 MM MISC Use to inject Basaglar and Humalog. Max: 3 needles daily. 100 each 2   Insulin Syringe-Needle U-100 (INSULIN SYRINGE .5CC/31GX5/16") 31G X 5/16" 0.5 ML MISC Use to inject Levemir 15 units daily. 100 each 2   metFORMIN (GLUCOPHAGE) 1000 MG tablet Take 1 tablet (1,000 mg total) by mouth daily with breakfast. 90 tablet 1   TRUEplus Lancets 28G MISC Use to check blood sugar 3 times daily 100 each 2   triamcinolone cream (KENALOG) 0.1 % Apply 1 Application topically 2 (two) times daily. (Patient not taking: Reported on 01/20/2023) 30 g 0   No current facility-administered medications on file prior to visit.    No Known Allergies  Social History   Socioeconomic History   Marital status: Married    Spouse name: Not on file   Number of children: 3   Years of education: Not on file   Highest education level: 9th grade  Occupational History   Not on file  Tobacco Use   Smoking status: Never   Smokeless tobacco: Never  Vaping Use   Vaping Use: Never used  Substance and Sexual Activity   Alcohol use: No   Drug use: Never   Sexual activity: Yes    Birth control/protection: Post-menopausal  Other Topics Concern   Not on file  Social History Narrative   Not on file   Social Determinants of Health   Financial Resource Strain: Not on file  Food Insecurity: No Food Insecurity (11/14/2022)   Hunger Vital Sign    Worried About Running Out of Food in the Last Year: Never  true    Ran Out of Food in the Last Year: Never true  Transportation Needs: No Transportation Needs (11/14/2022)   PRAPARE - Administrator, Civil Service (Medical): No    Lack of Transportation (Non-Medical): No  Physical Activity: Insufficiently Active (11/14/2022)   Exercise Vital Sign    Days of Exercise per Week: 7 days    Minutes of Exercise per Session: 20 min  Stress: No Stress Concern Present (11/14/2022)   [employer] of Occupational Health - Occupational Stress Questionnaire    Feeling of Stress : Not at all  Social Connections: Moderately Integrated (11/14/2022)   Social Connection and Isolation Panel [NHANES]    Frequency of Communication with Friends and Family: More than three times a week    Frequency of Social Gatherings with Friends and Family: More than three times a week    Attends Religious Services: More than 4 times per year    Active Member of [employer] or Organizations: No    Attends Banker Meetings: Never    Marital Status: Married  Catering manager Violence: Not At Risk (11/14/2022)   Humiliation, Afraid, Rape, and Kick questionnaire    Fear of Current or Ex-Partner: No    Emotionally Abused: No    Physically Abused: No    Sexually Abused: No    Family History  Problem Relation Age of Onset   Diabetes Father     No past surgical history on file.  ROS: Review of Systems Negative except as stated above  PHYSICAL EXAM: BP 103/69 (BP Location: Left Arm, Patient Position: Sitting, Cuff Size: Large)   Pulse 67   Temp 97.7 F (36.5 C) (Oral)   Ht 4\' 10"  (1.473 m)   Wt 140 lb (63.5 kg)   LMP 11/12/2016   SpO2 98%   BMI 29.26 kg/m   Physical Exam  General appearance - alert, well appearing, and in no distress Mental status - normal mood, behavior, speech, dress, motor activity, and thought processes Neck - supple, no significant adenopathy Chest - clear to auscultation, no wheezes, rales or rhonchi, symmetric air  entry Heart - normal rate, regular rhythm, normal S1, S2, no murmurs, rubs, clicks or gallops Extremities - peripheral pulses normal, no pedal edema, no clubbing or cyanosis      Latest Ref Rng & Units 12/22/2022    7:21 PM 05/10/2022   11:09 AM 01/10/2022    2:18 PM  CMP  Glucose 70 - 99 mg/dL 161  096  95   BUN 6 - 20 mg/dL 9  12  9    Creatinine 0.44 - 1.00 mg/dL 0.45  4.09  8.11   Sodium 135 - 145 mmol/L 136  137  140   Potassium 3.5 - 5.1 mmol/L 3.4  4.2  3.5   Chloride 98 - 111  mmol/L 100  97  105   CO2 22 - 32 mmol/L 23  24  24    Calcium 8.9 - 10.3 mg/dL 9.3  9.4  9.4   Total Protein 6.5 - 8.1 g/dL 7.8     Total Bilirubin 0.3 - 1.2 mg/dL 0.8     Alkaline Phos 38 - 126 U/L 92     AST 15 - 41 U/L 15     ALT 0 - 44 U/L 18      Lipid Panel     Component Value Date/Time   CHOL 196 01/08/2022 1105   TRIG 142 01/08/2022 1105   HDL 54 01/08/2022 1105   CHOLHDL 3.6 01/08/2022 1105   LDLCALC 117 (H) 01/08/2022 1105    CBC    Component Value Date/Time   WBC 9.5 12/22/2022 1921   RBC 4.48 12/22/2022 1921   HGB 13.4 12/22/2022 1921   HGB 12.9 09/10/2021 1054   HCT 38.1 12/22/2022 1921   HCT 38.0 09/10/2021 1054   PLT 233 12/22/2022 1921   PLT 271 05/07/2021 1436   MCV 85.0 12/22/2022 1921   MCV 89 09/10/2021 1054   MCH 29.9 12/22/2022 1921   MCHC 35.2 12/22/2022 1921   RDW 12.1 12/22/2022 1921   RDW 12.4 09/10/2021 1054   LYMPHSABS 1.7 09/10/2021 1054   MONOABS 0.4 05/09/2019 0415   EOSABS 0.1 09/10/2021 1054   BASOSABS 0.0 09/10/2021 1054    ASSESSMENT AND PLAN:  1. Type 2 diabetes mellitus with hyperglycemia, with long-term current use of insulin (HCC) Blood sugars by her report have improved. She will continue metformin 1 g daily.  Continue glargine insulin 28 units.  In regards to the Humalog, I told her that the pen that she currently uses does not need to be refrigerated so she can take her pen to work with her and keep it in her pocket or purse so that she  gives herself the Humalog injection twice a day as prescribed. Encouraged her to continue healthy eating habits and regular exercise as she has been doing. - metFORMIN (GLUCOPHAGE) 1000 MG tablet; Take 1 tablet (1,000 mg total) by mouth daily with breakfast.  Dispense: 90 tablet; Refill: 1  2. Hyperlipidemia associated with type 2 diabetes mellitus (HCC) Continue atorvastatin.  Check lipid profile on next visit.  3. Need for shingles vaccine Agreeable to Shingrix vaccine series.  Prescription given for first vaccine to take to our pharmacy. - Zoster Vaccine Adjuvanted [facility]) injection; Inject 0.5 mLs into the muscle once for 1 dose.  Dispense: 0.5 mL; Refill: 0    Patient was given the opportunity to ask questions.  Patient verbalized understanding of the plan and was able to repeat key elements of the plan.   This documentation was completed using Paediatric nurse.  Any transcriptional errors are unintentional.  No orders of the defined types were placed in this encounter.    Requested Prescriptions   Pending Prescriptions Disp Refills   metFORMIN (GLUCOPHAGE) 1000 MG tablet 90 tablet 1    Sig: Take 1 tablet (1,000 mg total) by mouth daily with breakfast.    No follow-ups on file.  Jonah Blue, MD, FACP

## 2023-01-22 ENCOUNTER — Ambulatory Visit: Payer: Self-pay | Attending: Internal Medicine

## 2023-01-28 ENCOUNTER — Other Ambulatory Visit: Payer: Self-pay

## 2023-02-18 ENCOUNTER — Other Ambulatory Visit: Payer: Self-pay

## 2023-02-20 ENCOUNTER — Ambulatory Visit: Payer: Self-pay | Admitting: Pharmacist

## 2023-05-22 ENCOUNTER — Ambulatory Visit: Payer: Self-pay | Admitting: Internal Medicine

## 2023-07-15 ENCOUNTER — Ambulatory Visit: Payer: Self-pay | Attending: Internal Medicine | Admitting: Internal Medicine

## 2023-07-15 ENCOUNTER — Other Ambulatory Visit: Payer: Self-pay

## 2023-07-15 VITALS — BP 112/78 | HR 70 | Temp 98.0°F | Ht <= 58 in | Wt 124.0 lb

## 2023-07-15 DIAGNOSIS — Z7984 Long term (current) use of oral hypoglycemic drugs: Secondary | ICD-10-CM

## 2023-07-15 DIAGNOSIS — E119 Type 2 diabetes mellitus without complications: Secondary | ICD-10-CM

## 2023-07-15 DIAGNOSIS — R634 Abnormal weight loss: Secondary | ICD-10-CM

## 2023-07-15 DIAGNOSIS — E785 Hyperlipidemia, unspecified: Secondary | ICD-10-CM

## 2023-07-15 DIAGNOSIS — E1165 Type 2 diabetes mellitus with hyperglycemia: Secondary | ICD-10-CM

## 2023-07-15 DIAGNOSIS — Z23 Encounter for immunization: Secondary | ICD-10-CM

## 2023-07-15 DIAGNOSIS — Z794 Long term (current) use of insulin: Secondary | ICD-10-CM

## 2023-07-15 DIAGNOSIS — E1169 Type 2 diabetes mellitus with other specified complication: Secondary | ICD-10-CM

## 2023-07-15 DIAGNOSIS — Z91199 Patient's noncompliance with other medical treatment and regimen due to unspecified reason: Secondary | ICD-10-CM

## 2023-07-15 LAB — POCT GLYCOSYLATED HEMOGLOBIN (HGB A1C): HbA1c, POC (controlled diabetic range): 13.5 % — AB (ref 0.0–7.0)

## 2023-07-15 LAB — GLUCOSE, POCT (MANUAL RESULT ENTRY): POC Glucose: 239 mg/dL — AB (ref 70–99)

## 2023-07-15 MED ORDER — ZOSTER VAC RECOMB ADJUVANTED 50 MCG/0.5ML IM SUSR
0.5000 mL | Freq: Once | INTRAMUSCULAR | 0 refills | Status: AC
  Filled 2023-07-15 (×2): qty 1, 1d supply, fill #0

## 2023-07-15 MED ORDER — METFORMIN HCL 1000 MG PO TABS
1000.0000 mg | ORAL_TABLET | Freq: Every day | ORAL | 1 refills | Status: DC
Start: 2023-07-15 — End: 2024-04-20
  Filled 2023-07-15: qty 90, 90d supply, fill #0
  Filled 2023-10-15: qty 90, 90d supply, fill #1

## 2023-07-15 MED ORDER — ATORVASTATIN CALCIUM 40 MG PO TABS
40.0000 mg | ORAL_TABLET | Freq: Every day | ORAL | 1 refills | Status: DC
Start: 2023-07-15 — End: 2023-12-19
  Filled 2023-07-15 (×2): qty 90, 90d supply, fill #0
  Filled 2023-10-15: qty 90, 90d supply, fill #1

## 2023-07-15 MED ORDER — ZOSTER VAC RECOMB ADJUVANTED 50 MCG/0.5ML IM SUSR
0.5000 mL | Freq: Once | INTRAMUSCULAR | 0 refills | Status: AC
Start: 2023-07-15 — End: 2023-07-15

## 2023-07-15 NOTE — Progress Notes (Signed)
Patient ID: Christie Martinez, female    DOB: 1971-03-06  MRN: [id number]  CC: Diabetes (DM f/u. Med refill. Nicki Reaper increased stress - inquiring if this raises BS /Discuss flu vax. Yes to shingles vax)   Subjective: Christie Martinez is a 52 y.o. female who presents for chronic ds management. Her concerns today include:  Patient with history of DM type II (dx 04/2019), HL, migraine without aura, diverticulitis with local perforation in the descending colon 04/2019.   [employer] Language interpreter used during this encounter. #[phone]  DM: Results for orders placed or performed in visit on 07/15/23  POCT glucose (manual entry)   Collection Time: 07/15/23  8:38 AM  Result Value Ref Range   POC Glucose 239 (A) 70 - 99 mg/dl  POCT glycosylated hemoglobin (Hb A1C)   Collection Time: 07/15/23  8:44 AM  Result Value Ref Range   Hemoglobin A1C     HbA1c POC (<> result, manual entry)     HbA1c, POC (prediabetic range)     HbA1c, POC (controlled diabetic range) 13.5 (A) 0.0 - 7.0 %  A1c significantly increased from last visit in June when it was 9.  BS this a.m is fasting level. Checks BS every other day with range over 200. Should be on metformin 1 g daily, glargine insulin 28 units daily and Humalog insulin 8 units 2 times a day with meals. Not taking Metformin consistently; reports missing dose about 3x/wk.  Also inconsistent in taking both insulins; takes about 4x/wk Reports stress at work and fatigue and falls asleep at nights before taking her basal insulin. Wakes up early 6 a.m to get ready for work but will be start at 9 a.m soon.  Thinks this will help with compliance with taking meds. -denies polyuria/polydipsia.  Endorses blurred vision sometimes -Weight is down 16 pounds since last visit.  HL: Should be on atorvastatin 40 mg daily.  Not taking consistently.  HM: yes flu shot and to 2nd shingrix vaccine.  Also due for COVID booster. Pt is uninsured  Patient  Active Problem List   Diagnosis Date Noted   Migraine without aura and without status migrainosus, not intractable 06/15/2020   Hyperlipidemia associated with type 2 diabetes mellitus (HCC) 07/06/2019   Type 2 diabetes mellitus without complication, with long-term current use of insulin (HCC) 07/05/2019   History of diverticulitis 07/05/2019   Abnormal LFTs 07/05/2019   Diverticulitis of colon with perforation 05/06/2019     Current Outpatient Medications on File Prior to Visit  Medication Sig Dispense Refill   atorvastatin (LIPITOR) 40 MG tablet Take 1 tablet (40 mg total) by mouth daily. 90 tablet 1   blood glucose meter kit and supplies KIT Dispense based on patient and insurance preference. Use up to four times daily as directed. (FOR ICD-9 250.00, 250.01). 1 each 0   glucose blood test strip Use as instructed to check blood sugar daily. 100 each 11   Insulin Glargine (BASAGLAR KWIKPEN) 100 UNIT/ML Inject 28 Units into the skin daily. 9 mL 3   insulin lispro (HUMALOG KWIKPEN) 100 UNIT/ML KwikPen Inject 8 Units into the skin 2 (two) times daily. 6 mL 2   Insulin Pen Needle (TRUEPLUS 5-BEVEL PEN NEEDLES) 32G X 4 MM MISC Use to inject Basaglar and Humalog. Max: 3 needles daily. 100 each 2   Insulin Syringe-Needle U-100 (INSULIN SYRINGE .5CC/31GX5/16") 31G X 5/16" 0.5 ML MISC Use to inject Levemir 15 units daily. 100 each 2   metFORMIN (GLUCOPHAGE)  1000 MG tablet Take 1 tablet (1,000 mg total) by mouth daily with breakfast. 90 tablet 1   TRUEplus Lancets 28G MISC Use to check blood sugar 3 times daily 100 each 2   HYDROcodone-acetaminophen (NORCO/VICODIN) 5-325 MG tablet Take 2 tablets by mouth every 4 (four) hours as needed. (Patient not taking: Reported on 07/15/2023) 8 tablet 0   triamcinolone cream (KENALOG) 0.1 % Apply 1 Application topically 2 (two) times daily. (Patient not taking: Reported on 07/15/2023) 30 g 0   No current facility-administered medications on file prior to visit.     No Known Allergies  Social History   Socioeconomic History   Marital status: Married    Spouse name: Not on file   Number of children: 3   Years of education: Not on file   Highest education level: 9th grade  Occupational History   Not on file  Tobacco Use   Smoking status: Never   Smokeless tobacco: Never  Vaping Use   Vaping status: Never Used  Substance and Sexual Activity   Alcohol use: No   Drug use: Never   Sexual activity: Yes    Birth control/protection: Post-menopausal  Other Topics Concern   Not on file  Social History Narrative   Not on file   Social Drivers of Health   Financial Resource Strain: Not on file  Food Insecurity: No Food Insecurity (11/14/2022)   Hunger Vital Sign    Worried About Running Out of Food in the Last Year: Never true    Ran Out of Food in the Last Year: Never true  Transportation Needs: No Transportation Needs (11/14/2022)   PRAPARE - Administrator, Civil Service (Medical): No    Lack of Transportation (Non-Medical): No  Physical Activity: Insufficiently Active (11/14/2022)   Exercise Vital Sign    Days of Exercise per Week: 7 days    Minutes of Exercise per Session: 20 min  Stress: No Stress Concern Present (11/14/2022)   [employer] of Occupational Health - Occupational Stress Questionnaire    Feeling of Stress : Not at all  Social Connections: Moderately Integrated (11/14/2022)   Social Connection and Isolation Panel [NHANES]    Frequency of Communication with Friends and Family: More than three times a week    Frequency of Social Gatherings with Friends and Family: More than three times a week    Attends Religious Services: More than 4 times per year    Active Member of [employer] or Organizations: No    Attends Banker Meetings: Never    Marital Status: Married  Catering manager Violence: Not At Risk (11/14/2022)   Humiliation, Afraid, Rape, and Kick questionnaire    Fear of Current or  Ex-Partner: No    Emotionally Abused: No    Physically Abused: No    Sexually Abused: No    Family History  Problem Relation Age of Onset   Diabetes Father     No past surgical history on file.  ROS: Review of Systems Negative except as stated above  PHYSICAL EXAM: BP 112/78 (BP Location: Left Arm, Patient Position: Sitting, Cuff Size: Normal)   Pulse 70   Temp 98 F (36.7 C) (Oral)   Ht 4\' 10"  (1.473 m)   Wt 124 lb (56.2 kg)   LMP 11/12/2016   SpO2 100%   BMI 25.92 kg/m  Wt Readings from Last 3 Encounters:  07/15/23 124 lb (56.2 kg)  01/20/23 140 lb (63.5 kg)  09/10/22 136 lb (61.7  kg)     Physical Exam  General appearance - alert, well appearing, and in no distress Mental status - normal mood, behavior, speech, dress, motor activity, and thought processes Mouth - mucous membranes moist, pharynx normal without lesions Neck - supple, no significant adenopathy.  No thyroid enlargement Chest - clear to auscultation, no wheezes, rales or rhonchi, symmetric air entry Heart - normal rate, regular rhythm, normal S1, S2, no murmurs, rubs, clicks or gallops Extremities - peripheral pulses normal, no pedal edema, no clubbing or cyanosis Diabetic Foot Exam - Simple   Simple Foot Form Diabetic Foot exam was performed with the following findings: Yes 07/15/2023  9:12 AM  Visual Inspection No deformities, no ulcerations, no other skin breakdown bilaterally: Yes Sensation Testing Intact to touch and monofilament testing bilaterally: Yes Pulse Check Posterior Tibialis and Dorsalis pulse intact bilaterally: Yes Comments        07/15/2023    8:44 AM 01/20/2023   12:12 PM 11/14/2022    4:22 PM  Depression screen PHQ 2/9  Decreased Interest 0 0 0  Down, Depressed, Hopeless 0 0 0  PHQ - 2 Score 0 0 0  Altered sleeping 0 0   Tired, decreased energy 1 1   Change in appetite 0 0   Feeling bad or failure about yourself  0 0   Trouble concentrating 0 0   Moving slowly or  fidgety/restless 0 0   Suicidal thoughts 0 0   PHQ-9 Score 1 1   Difficult doing work/chores Not difficult at all         Latest Ref Rng & Units 12/22/2022    7:21 PM 05/10/2022   11:09 AM 01/10/2022    2:18 PM  CMP  Glucose 70 - 99 mg/dL 981  191  95   BUN 6 - 20 mg/dL 9  12  9    Creatinine 0.44 - 1.00 mg/dL 4.78  2.95  6.21   Sodium 135 - 145 mmol/L 136  137  140   Potassium 3.5 - 5.1 mmol/L 3.4  4.2  3.5   Chloride 98 - 111 mmol/L 100  97  105   CO2 22 - 32 mmol/L 23  24  24    Calcium 8.9 - 10.3 mg/dL 9.3  9.4  9.4   Total Protein 6.5 - 8.1 g/dL 7.8     Total Bilirubin 0.3 - 1.2 mg/dL 0.8     Alkaline Phos 38 - 126 U/L 92     AST 15 - 41 U/L 15     ALT 0 - 44 U/L 18      Lipid Panel     Component Value Date/Time   CHOL 196 01/08/2022 1105   TRIG 142 01/08/2022 1105   HDL 54 01/08/2022 1105   CHOLHDL 3.6 01/08/2022 1105   LDLCALC 117 (H) 01/08/2022 1105    CBC    Component Value Date/Time   WBC 9.5 12/22/2022 1921   RBC 4.48 12/22/2022 1921   HGB 13.4 12/22/2022 1921   HGB 12.9 09/10/2021 1054   HCT 38.1 12/22/2022 1921   HCT 38.0 09/10/2021 1054   PLT 233 12/22/2022 1921   PLT 271 05/07/2021 1436   MCV 85.0 12/22/2022 1921   MCV 89 09/10/2021 1054   MCH 29.9 12/22/2022 1921   MCHC 35.2 12/22/2022 1921   RDW 12.1 12/22/2022 1921   RDW 12.4 09/10/2021 1054   LYMPHSABS 1.7 09/10/2021 1054   MONOABS 0.4 05/09/2019 0415   EOSABS 0.1 09/10/2021 1054  BASOSABS 0.0 09/10/2021 1054    ASSESSMENT AND PLAN: 1. Type 2 diabetes mellitus with hyperglycemia, with long-term current use of insulin (HCC) (Primary) Not at goal due to med nonadherence Patient states that now that she has seen her A1c and blood sugars, she feels she needs to do better and has made a commitment to doing so.  I encouraged her to purchase a weekly med box to put metformin and atorvastatin in to help her remember to take them.  She will also work on being more compliant with taking both  insulins.  Advised to check blood sugars 1-2 times a day before meals with goal being 90-130.  Record the readings and bring them with her in 1 month to see our clinical pharmacist. - POCT glycosylated hemoglobin (Hb A1C) - POCT glucose (manual entry) - metFORMIN (GLUCOPHAGE) 1000 MG tablet; Take 1 tablet (1,000 mg total) by mouth daily with breakfast.  Dispense: 90 tablet; Refill: 1 - Microalbumin / creatinine urine ratio - CBC - Comprehensive metabolic panel  2. Diabetes mellitus treated with oral medication (HCC) See #1 above  3. Unintentional weight loss Most likely due to uncontrolled diabetes.  However we will check thyroid level - TSH+T4F+T3Free  4. Hyperlipidemia associated with type 2 diabetes mellitus (HCC) Encouraged adherence.  Discussed risks of cardiovascular events being higher in people with diabetes. - atorvastatin (LIPITOR) 40 MG tablet; Take 1 tablet (40 mg total) by mouth daily.  Dispense: 90 tablet; Refill: 1 - Lipid panel  5. Nonadherence to medical treatment See discussion above  6. Need for shingles vaccine - Zoster Vaccine Adjuvanted [facility]) injection; Inject 0.5 mLs into the muscle once for 1 dose.  Dispense: 0.5 mL; Refill: 0  7. Need for influenza vaccination Given today.     Patient was given the opportunity to ask questions.  Patient verbalized understanding of the plan and was able to repeat key elements of the plan.   This documentation was completed using Paediatric nurse.  Any transcriptional errors are unintentional.  Orders Placed This Encounter  Procedures   POCT glycosylated hemoglobin (Hb A1C)   POCT glucose (manual entry)     Requested Prescriptions    No prescriptions requested or ordered in this encounter    No follow-ups on file.  Jonah Blue, MD, FACP

## 2023-07-17 ENCOUNTER — Other Ambulatory Visit: Payer: Self-pay

## 2023-07-17 ENCOUNTER — Telehealth: Payer: Self-pay

## 2023-07-17 LAB — COMPREHENSIVE METABOLIC PANEL
ALT: 16 [IU]/L (ref 0–32)
AST: 19 [IU]/L (ref 0–40)
Albumin: 4.7 g/dL (ref 3.9–4.9)
Alkaline Phosphatase: 138 [IU]/L — ABNORMAL HIGH (ref 44–121)
BUN/Creatinine Ratio: 17 (ref 9–23)
BUN: 9 mg/dL (ref 6–24)
Bilirubin Total: 0.6 mg/dL (ref 0.0–1.2)
CO2: 24 mmol/L (ref 20–29)
Calcium: 9.4 mg/dL (ref 8.7–10.2)
Chloride: 98 mmol/L (ref 96–106)
Creatinine, Ser: 0.54 mg/dL — ABNORMAL LOW (ref 0.57–1.00)
Globulin, Total: 2.2 g/dL (ref 1.5–4.5)
Glucose: 192 mg/dL — ABNORMAL HIGH (ref 70–99)
Potassium: 3.8 mmol/L (ref 3.5–5.2)
Sodium: 136 mmol/L (ref 134–144)
Total Protein: 6.9 g/dL (ref 6.0–8.5)
eGFR: 112 mL/min/{1.73_m2} (ref >59)

## 2023-07-17 LAB — LIPID PANEL
Chol/HDL Ratio: 3.6 {ratio} (ref 0.0–4.4)
Cholesterol, Total: 206 mg/dL — ABNORMAL HIGH (ref 100–199)
HDL: 57 mg/dL (ref >39)
LDL Chol Calc (NIH): 132 mg/dL — ABNORMAL HIGH (ref 0–99)
Triglycerides: 94 mg/dL (ref 0–149)
VLDL Cholesterol Cal: 17 mg/dL (ref 5–40)

## 2023-07-17 LAB — TSH+T4F+T3FREE
Free T4: 1.36 ng/dL (ref 0.82–1.77)
T3, Free: 2.7 pg/mL (ref 2.0–4.4)
TSH: 1.4 u[IU]/mL (ref 0.450–4.500)

## 2023-07-17 LAB — CBC
Hematocrit: 41.1 % (ref 34.0–46.6)
Hemoglobin: 13.9 g/dL (ref 11.1–15.9)
MCH: 30 pg (ref 26.6–33.0)
MCHC: 33.8 g/dL (ref 31.5–35.7)
MCV: 89 fL (ref 79–97)
Platelets: 285 10*3/uL (ref 150–450)
RBC: 4.64 x10E6/uL (ref 3.77–5.28)
RDW: 12.2 % (ref 11.7–15.4)
WBC: 5 10*3/uL (ref 3.4–10.8)

## 2023-07-17 LAB — MICROALBUMIN / CREATININE URINE RATIO
Creatinine, Urine: 51.2 mg/dL
Microalb/Creat Ratio: 25 mg/g{creat} (ref 0–29)
Microalbumin, Urine: 12.8 ug/mL

## 2023-07-17 NOTE — Telephone Encounter (Signed)
-----   Message from [facility] Taiwana Willison M sent at 07/17/2023 10:37 AM EST -----  ----- Message ----- From: Marcine Matar, MD Sent: 07/16/2023  10:49 AM EST To: Johna Roles, CMA  Kidney and liver function tests are good. Cholesterol levels are elevated.  This increases risk for heart attack and strokes.  Please take atorvastatin as prescribed. Blood cell counts are normal. Thyroid function tests are normal.

## 2023-07-17 NOTE — Telephone Encounter (Signed)
Pt was called and vm was left, Information has been sent to nurse pool.     Interpreter id # Mikle [email or web address] [phone]

## 2023-08-12 ENCOUNTER — Ambulatory Visit: Payer: Self-pay | Admitting: Pharmacist

## 2023-09-14 NOTE — Progress Notes (Unsigned)
S:     No chief complaint on file.  53 y.o. female who presents for diabetes evaluation, education, and management. Patient arrives in *** good spirits and presents without *** any assistance. ***Patient is accompanied by ***.   Patient was referred and last seen by Primary Care Provider, Dr. Laural Benes, on 07/15/23. Patient was last seen by pharmacy in May 2024. She has had a gap in care due to appt cancellations. PMH is significant for T2DM, HLD, migraine without aura, diverticulitis with local perforation in the descending colon in 2020.    At last visit with Dr. Laural Benes, pt A1c had worsened from 9.0% to 13.5%. Pt attributed change to increased stress and medication nonadherence - she was only taking metformin ~3x/week and taking both her insulins ~4x/week. She would fall asleep at night before taking her insulin, and also reported that the fridge at her work was not working properly, so she had not been taking her meal time insulin to work with her. She had lost 16 lbs. She was also not taking atorvastatin consistently. She was instructed to obtain a pill box for her oral medications, and counseled that her insulin can be out at room temperature for 28 days.   Patient reports Diabetes was diagnosed in ***.   Family/Social History: ***  Current diabetes medications include: insulin glargine (Basaglar) 28 units daily, insulin lispro (Humalog) 8 units BID with meals, metformin IR 1000 mg daily with breakfast,  Current hypertension medications include: none Current hyperlipidemia medications include: atorvastatin 40 mg PO daily  Patient reports adherence to taking all medications as prescribed.  *** Patient denies adherence with medications, reports missing *** medications *** times per week, on average.  Nonadherence per fill hx: insulin glargine last filled for 32 ds on 11/14/22, insulin lispro last filled for 38 ds on 11/14/22,   Do you feel that your medications are working for you? {YES  NO:22349} Have you been experiencing any side effects to the medications prescribed? {YES NO:22349} Do you have any problems obtaining medications due to transportation or finances? {YES [id number] Insurance coverage: none - [facility] patient   Patient {Actions; denies-reports:120008} hypoglycemic events.  Reported home fasting blood sugars: ***  Reported 2 hour post-meal/random blood sugars: ***.  Patient {Actions; denies-reports:120008} nocturia (nighttime urination).  Patient {Actions; denies-reports:120008} neuropathy (nerve pain). Patient {Actions; denies-reports:120008} visual changes. Patient {Actions; denies-reports:120008} self foot exams.   Patient reported dietary habits: Eats *** meals/day Breakfast: *** Lunch: *** Dinner: *** Snacks: *** Drinks: ***  Within the past 12 months, did you worry whether your food would run out before you got money to buy more? {YES NO:22349} Within the past 12 months, did the food you bought run out, and you didn't have money to get more? {YES NO:22349} PHQ-9 Score: ***  Patient-reported exercise habits: ***   O:   ROS  Physical Exam  7 day average blood glucose: ***  Libre3 CGM Download today *** % Time CGM is active: ***% Average Glucose: *** mg/dL Glucose Management Indicator: ***  Glucose Variability: ***% (goal <36%) Time in Goal:  - Time in range 70-180: ***% - Time above range: ***% - Time below range: ***% Observed patterns:   Lab Results  Component Value Date   HGBA1C 13.5 (A) 07/15/2023   There were no vitals filed for this visit.  Lipid Panel     Component Value Date/Time   CHOL 206 (H) 07/15/2023 0922   TRIG 94 07/15/2023 0922   HDL 57 07/15/2023 1610  CHOLHDL 3.6 07/15/2023 0922   LDLCALC 132 (H) 07/15/2023 0922    Clinical Atherosclerotic Cardiovascular Disease (ASCVD): {YES/NO:21197} The 10-year ASCVD risk score (Arnett DK, et al., 2019) is: 1.8%   Values used to calculate the score:     Age: 35  years     Sex: Female     Is Non-Hispanic African American: No     Diabetic: Yes     Tobacco smoker: No     Systolic Blood Pressure: 112 mmHg     Is BP treated: No     HDL Cholesterol: 57 mg/dL     Total Cholesterol: 206 mg/dL   Patient is participating in a Managed Medicaid Plan:  {MM YES/NO:27447::"Yes"}   A/P: Diabetes longstanding and currently uncontrolled with last A1c 13.5% above goal <7% and worsened from 9.0%. Patient is *** able to verbalize appropriate hypoglycemia management plan. Medication adherence appears ***. Control is suboptimal due to ***. -{Meds adjust:18428} basal insulin *** Lantus/Basaglar/Semglee (insulin glargine) *** Tresiba (insulin degludec) from *** units to *** units daily in the morning. Patient will continue to titrate 1 unit every *** days if fasting blood sugar > 100mg /dl until fasting blood sugars reach goal or next visit.  -{Meds adjust:18428} rapid insulin *** Novolog (insulin aspart) *** Humalog (insulin lispro) from *** to ***.  -{Meds adjust:18428} GLP-1 *** Trulicity (dulaglutide) *** Ozempic (semaglutide) *** Mounjaro (tirzepatide) from *** mg to *** mg .  -{Meds adjust:18428} SGLT2-I *** Farxiga (dapagliflozin) *** Jardiance (empagliflozin) 10 mg. Counseled on sick day rules. -{Meds adjust:18428} metformin ***.  -Patient educated on purpose, proper use, and potential adverse effects of ***.  -Extensively discussed pathophysiology of diabetes, recommended lifestyle interventions, dietary effects on blood sugar control.  -Counseled on s/sx of and management of hypoglycemia.  -Next A1c anticipated ***.   ASCVD risk - primary ***secondary prevention in patient with diabetes. Last LDL is *** not at goal of <16 *** mg/dL. ASCVD risk factors include *** and 10-year ASCVD risk score of ***. {Desc; low/moderate/high:110033} intensity statin indicated.  -{Meds adjust:18428} ***statin *** mg.   Hypertension longstanding *** currently ***. Blood pressure  goal of <130/80 *** mmHg. Medication adherence ***. Blood pressure control is suboptimal due to ***. -{Meds adjust:18428} *** mg.  Written patient instructions provided. Patient verbalized understanding of treatment plan.  Total time in face to face counseling *** minutes.    Follow-up:  Pharmacist *** PCP clinic visit in 11/13/23 Patient seen with ***  Nils Pyle, PharmD PGY1 Pharmacy Resident

## 2023-09-15 ENCOUNTER — Encounter: Payer: Self-pay | Admitting: Pharmacist

## 2023-09-15 ENCOUNTER — Other Ambulatory Visit: Payer: Self-pay

## 2023-09-15 ENCOUNTER — Ambulatory Visit: Payer: Self-pay | Attending: Family Medicine | Admitting: Pharmacist

## 2023-09-15 DIAGNOSIS — E1165 Type 2 diabetes mellitus with hyperglycemia: Secondary | ICD-10-CM

## 2023-09-15 DIAGNOSIS — Z7984 Long term (current) use of oral hypoglycemic drugs: Secondary | ICD-10-CM

## 2023-09-15 DIAGNOSIS — Z794 Long term (current) use of insulin: Secondary | ICD-10-CM

## 2023-09-15 DIAGNOSIS — E119 Type 2 diabetes mellitus without complications: Secondary | ICD-10-CM

## 2023-09-15 MED ORDER — TRUEPLUS LANCETS 28G MISC
2 refills | Status: AC
Start: 2023-09-15 — End: ?
  Filled 2023-09-15: qty 100, 33d supply, fill #0

## 2023-09-15 MED ORDER — TRULICITY 0.75 MG/0.5ML ~~LOC~~ SOAJ
0.7500 mg | SUBCUTANEOUS | 5 refills | Status: DC
Start: 2023-09-15 — End: 2023-12-19
  Filled 2023-09-15: qty 2, 28d supply, fill #0
  Filled 2023-10-13 – 2023-10-15 (×3): qty 2, 28d supply, fill #1

## 2023-09-15 MED ORDER — GLUCOSE BLOOD VI STRP
ORAL_STRIP | 11 refills | Status: AC
Start: 2023-09-15 — End: ?
  Filled 2023-09-15: qty 100, 100d supply, fill #0

## 2023-09-15 MED ORDER — INSULIN LISPRO (1 UNIT DIAL) 100 UNIT/ML (KWIKPEN)
8.0000 [IU] | PEN_INJECTOR | Freq: Two times a day (BID) | SUBCUTANEOUS | 2 refills | Status: AC
Start: 2023-09-15 — End: ?
  Filled 2023-09-15: qty 6, 38d supply, fill #0

## 2023-09-15 MED ORDER — BASAGLAR KWIKPEN 100 UNIT/ML ~~LOC~~ SOPN
28.0000 [IU] | PEN_INJECTOR | Freq: Every day | SUBCUTANEOUS | 3 refills | Status: AC
Start: 2023-09-15 — End: ?
  Filled 2023-09-15: qty 9, 32d supply, fill #0
  Filled 2023-10-15: qty 9, 32d supply, fill #1
  Filled 2024-05-11: qty 9, 32d supply, fill #2

## 2023-09-15 NOTE — Patient Instructions (Addendum)
Para la tos nocturna, le recomendamos tomar Mucinex.   INICIO Trulicity 0,75 mg inyectado en la piel una vez por semana CONTINUAR insulina glargina Psychologist, sport and exercise) 28 unidades antes de acostarse e insulina lispro (Humalog) 8 unidades con las comidas  Llmenos si tiene niveles bajos de Production assistant, radio (menos de 70) o si se siente mareado, tembloroso o sudoroso, ya que es posible que necesitemos ajustar su insulina.  Llmanos y deja de tomar trulicity si tienes dolor de estmago intenso o vmitos.   For your cough at night - we would recommend taking Mucinex.   START Trulicity 0.75 mg injected into the skin once weekly CONTINUE insulin glargine (Basaglar) 28 units at bedtime and insulin lispro (Humalog) 8 units with meals  Call us if you have low blood sugars (less than 70) or if you feel dizzy, shaky, or sweaty since we may need to adjust your insulin.  Call us and stop taking trulicity if you have severe stomach pain or vomiting.

## 2023-10-12 NOTE — Progress Notes (Unsigned)
 S:     No chief complaint on file.  53 y.o. female who presents for diabetes evaluation, education, and management. Patient arrives in  good spirits and presents without  any assistance. Utilized pacific interpreters - Okey Dupre [phone].   Patient was referred and last seen by Primary Care Provider, Dr. Laural Benes, on 07/15/23. Patient was last seen by pharmacy in May 2024. She has had a gap in care due to appt cancellations. PMH is significant for T2DM, HLD, migraine without aura, diverticulitis with local perforation in the descending colon in 2020.    At last visit with Dr. Laural Benes, pt A1c had worsened from 9.0% to 13.5%. Pt attributed change to increased stress and medication nonadherence - she was only taking metformin ~3x/week and taking both her insulins ~4x/week. She would fall asleep at night before taking her insulin, and also reported that the fridge at her work was not working properly, so she had not been taking her meal time insulin to work with her. She had lost 16 lbs. She was also not taking atorvastatin consistently. She was instructed to obtain a pill box for her oral medications, and counseled that her insulin can be out at room temperature for 28 days. At pharmacist appt on 09/15/23 - emphasized importance of adherence and initiated Trulicity via [facility] supply.   Today ***  OLD: patient reports she is feeling better since she has been taking her insulin more consistently. She denies missed doses. Her fill hx shows that her insulin has not been dispensed since Apr 2024. She states that she still has two pens left of each insulin, but she will plan to pick more up today before she leaves. She also requests refills of lancets and test strips. Denies history of pancreatitis, denies personal or family hx of MTC or MEN II.  Patient reports Diabetes was diagnosed in 2023.   Family/Social History:  Family hx: DM in father  Current diabetes medications include: insulin glargine (Basaglar) 28  units at bedtime, insulin lispro (Humalog) 8 units BID with meals (morning and evening), metformin IR 1000 mg daily with breakfast, Trulicity 0.75 mg subcutaneous weekly Previous DM medications: cannot tolerate metformin IR 1000 mg BID (GI)  Current hypertension medications include: none  Current hyperlipidemia medications include: atorvastatin 40 mg PO daily  Patient reports adherence to taking all medications as prescribed.***  Nonadherence per fill hx: insulin glargine last filled for 32 ds on 11/14/22, insulin lispro last filled for 38 ds on 11/14/22, ***  Do you feel that your medications are working for you? yes Have you been experiencing any side effects to the medications prescribed? no Do you have any problems obtaining medications due to transportation or finances? no Insurance coverage: none - [facility] patient   Patient denies hypoglycemic events.***  Reports she is checking her BG at home - daily fasting and post-prandial glucose *** Reported home fasting blood sugars: 180, 190 - at last appt with Dr. Laural Benes FBG were 300s (started having breakfast at her job - orange and cranberry juice) Reported 2 hour post-meal/random blood sugars: > 200.   Patient denies nocturia (nighttime urination). *** Patient denies neuropathy (nerve pain). Patient denies visual changes. Patient reports self foot exams.    Patient reported dietary habits: Eats 3 meals/day*** Breakfast: two eggs, coffee, 3 tortillas Lunch: buys at her job - usually 1 pizza with vegetables, or rice with chicken Dinner: often 1 portion of rice and meat,  Snacks: minimal Drinks: Reports drinking 6 bottles of water  per day. Switched gatorade to zero sugar gatorade.   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 ***   O:   ROS  Physical Exam   Lab Results  Component Value Date   HGBA1C 13.5 (A) 07/15/2023   There were no vitals filed for  this visit.  Lipid Panel     Component Value Date/Time   CHOL 206 (H) 07/15/2023 0922   TRIG 94 07/15/2023 0922   HDL 57 07/15/2023 0922   CHOLHDL 3.6 07/15/2023 0922   LDLCALC 132 (H) 07/15/2023 0922    Clinical Atherosclerotic Cardiovascular Disease (ASCVD): No  The 10-year ASCVD risk score (Arnett DK, et al., 2019) is: 1.8%   Values used to calculate the score:     Age: 23 years     Sex: Female     Is Non-Hispanic African American: No     Diabetic: Yes     Tobacco smoker: No     Systolic Blood Pressure: 112 mmHg     Is BP treated: No     HDL Cholesterol: 57 mg/dL     Total Cholesterol: 206 mg/dL   Z6X due today F/u restarting Trulicity? F/u in 1 mo   A/P: Diabetes longstanding and currently uncontrolled with last A1c 13.5% above goal <7% and worsened from 9.0%. FBG are improved but still uncontrolled with increased adherence since last appt. She is not having overt hyperglycemia, and denies s/sx of hypoglycemia. Patient is able to verbalize appropriate hypoglycemia management plan. Patient requires additional pharmacotherapy to improve DM control. She is a good candidate for GLP-1RA to avoid further increasing insulin - denies history of pancreatitis, denies personal or family hx of MTC or MEN II. BMI is WNL.  -Continued basal insulin Basaglar (insulin glargine) 28 units daily at bedtime.  -Continued rapid insulin Humalog (insulin lispro) 8 units BID with her largest meals -Started GLP-1 Trulicity (dulaglutide) 0.75 mg subcutaneous weekly.  -Continued metformin IR 1000 mg PO daily.  -Medications sent to [facility] pharmacy for patient to pick up after appointment today -Patient educated on purpose, proper use, and potential adverse effects of Trulcity. Advised to eat smaller portions of food and stop eating when she feels full to reduce risk of GI AE. Advised to continue water intake and increase protein intake.  -Educated patient to call the office if she experiences hypoglycemia  after starting Trulicity, and to call the office and stop taking Trulicity if she has vomiting or severe abdominal pain.  -Extensively discussed pathophysiology of diabetes, recommended lifestyle interventions, dietary effects on blood sugar control.  -Counseled on s/sx of and management of hypoglycemia.  -Next A1c anticipated 10/13/22.   ASCVD risk - primary  prevention in patient with diabetes. Last LDL is 132 mg/dL not at goal of <09  mg/dL. ASCVD risk factors include T2DM. 10-year ASCVD risk score of 1.8%. moderate to high intensity statin indicated.  -Continued atorvastatin 40 mg daily.   Written patient instructions provided. Patient verbalized understanding of treatment plan.  Total time in face to face counseling 35 minutes.    Follow-up:  Pharmacist 1 mo *** PCP clinic visit in 11/13/23  Nils Pyle, PharmD PGY1 Pharmacy Resident

## 2023-10-13 ENCOUNTER — Ambulatory Visit: Payer: Self-pay | Attending: Family Medicine | Admitting: Pharmacist

## 2023-10-13 ENCOUNTER — Other Ambulatory Visit: Payer: Self-pay

## 2023-10-13 ENCOUNTER — Encounter: Payer: Self-pay | Admitting: Pharmacist

## 2023-10-13 DIAGNOSIS — Z794 Long term (current) use of insulin: Secondary | ICD-10-CM

## 2023-10-13 DIAGNOSIS — Z7984 Long term (current) use of oral hypoglycemic drugs: Secondary | ICD-10-CM

## 2023-10-13 DIAGNOSIS — Z7985 Long-term (current) use of injectable non-insulin antidiabetic drugs: Secondary | ICD-10-CM

## 2023-10-13 DIAGNOSIS — E119 Type 2 diabetes mellitus without complications: Secondary | ICD-10-CM

## 2023-10-13 LAB — POCT GLYCOSYLATED HEMOGLOBIN (HGB A1C): HbA1c, POC (controlled diabetic range): 10.7 % — AB (ref 0.0–7.0)

## 2023-10-13 NOTE — Patient Instructions (Addendum)
  Qu bueno verte hoy!   Aumenta tu consumo de agua a 6-7 botellas al da para Paramedic la deshidratacin causada por Trulicity.  Para el dolor de [place], prueba a tomar Tylenol (paracetamol) 1000 mg.  Contina con Trulicity 0.75 mg semanalmente. Contina con metformina 1000 mg [employer]. Contina con Engineer, petroleum (insulina glargina) 28 unidades con las comidas. Contina con Humalog (insulina lispro) 8 unidades con las comidas.   It was great to see you today!  Please increase your water intake to 6-7 bottles per day to help with dehydration from Trulicity For headache, try taking Tylenol (acetaminophen) 1000 mg   Continue Trulicity 0.75 mg weekly Continue metformin 1000 mg twice daily Continue Basalgar (insulin glargine) 28 units with meals Continue Humalog (insulin lispro) 8 units with meals

## 2023-10-15 ENCOUNTER — Other Ambulatory Visit: Payer: Self-pay

## 2023-11-13 ENCOUNTER — Ambulatory Visit: Payer: Self-pay | Admitting: Internal Medicine

## 2023-12-07 NOTE — Progress Notes (Deleted)
 S:     No chief complaint on file.  53 y.o. female who presents for diabetes evaluation, education, and management. Patient arrives in  good spirits and presents without  any assistance. Utilized pacific interpreters - ***  Patient was referred and last seen by Primary Care Provider, Dr. Lincoln Renshaw, on 07/15/23. PMH is significant for T2DM, HLD, migraine without aura, diverticulitis with local perforation in the descending colon in 2020.    At last visit with Dr. Lincoln Renshaw, pt A1c had worsened from 9.0% to 13.5%. Pt attributed change to increased stress and medication nonadherence - she was only taking metformin  ~3x/week and taking both her insulins ~4x/week. She would fall asleep at night before taking her insulin , and also reported that the fridge at her work was not working properly, so she had not been taking her meal time insulin  to work with her. She had lost 16 lbs. She was also not taking atorvastatin  consistently. She was instructed to obtain a pill box for her oral medications, and counseled that her insulin  can be out at room temperature for 28 days. At pharmacist appt on 09/15/23 - emphasized importance of adherence and initiated Trulicity  via [facility] supply. At last visit with pharmacy on 10/13/23, A1C improved from 13.5% to 10.7%. She had only been on Trulicity  for ~1 mo, so current regimen was continued.   Today: incr Trulicity  if possible, recheck lipid panel since resuming atorva? Metformin  adherence Incr water/protein?  OLD: Today patient reports doing well. She did experience HA, nausea, vomiting, after initiating Trulicity . She has taken all 4 injections (administers on Monday). Reports symptoms started after the first injection. She only had vomiting x1 day about 3 days after her first injection. The HA is persistent, but GI issues have resolved. She is drinking 4-5 bottles of water per day. She has taken ibuprofen  400 mg a couple times over the past mo for HA. Overall, she reports that  symptoms are tolerable and she would like to continue Trulicity . She has run out of Trulicity  and is due for dose today. Patient reports she has felt increased energy with improved BG.   Patient reports Diabetes was diagnosed in 2023.   Family/Social History:  Family hx: DM in father  Current diabetes medications include: insulin  glargine (Basaglar ) 28 units at bedtime, insulin  lispro (Humalog ) 8 units before breakfast, metformin  IR 1000 mg daily with breakfast, Trulicity  0.75 mg subcutaneous weekly (Mondays)  Previous DM medications: cannot tolerate metformin  IR 1000 mg BID (GI)  Current hypertension medications include: none  Current hyperlipidemia medications include: atorvastatin  40 mg PO daily (reports she has excess supply at home)  Patient reports adherence to taking all medications as prescribed. Fill history suggests patient may be due for refills, but she reports having good supplies of atorvastatin  and metformin  at  home.   Do you feel that your medications are working for you? yes Have you been experiencing any side effects to the medications prescribed? no Do you have any problems obtaining medications due to transportation or finances? no Insurance coverage: none - [facility] patient   Patient denies hypoglycemic events - denies BG < 70. Reports that she does feel a little shaky right now - but she has just come from work and has not eaten anything in a while. Reports this does not happen frequently.   Reports she is checking her BG at home - daily fasting and post-prandial glucose  Reported home fasting blood sugars: lowest 105, usually <130 Reported 2 hour post-meal/random blood  sugars: 120-150s, reports one BG > 200.  Patient denies nocturia (nighttime urination).  Patient denies neuropathy (nerve pain). Patient denies visual changes. Patient reports self foot exams.   Patient reported dietary habits: Eats 3 meals/day Breakfast: two eggs, coffee, 3 tortillas Lunch: buys  at her job - usually 1 pizza with vegetables, or rice with chicken, recently has been eating a salad at lunch without protein Dinner: often 1 portion of rice and meat,  Snacks: minimal Drinks: Reports drinking 4-5 bottles of water per day. Cut out sugary beverages.   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    O:   ROS  Physical Exam   Lab Results  Component Value Date   HGBA1C 10.7 (A) 10/13/2023   There were no vitals filed for this visit.  UACR 07/15/23: 25 mg/g  Lipid Panel     Component Value Date/Time   CHOL 206 (H) 07/15/2023 0922   TRIG 94 07/15/2023 0922   HDL 57 07/15/2023 0922   CHOLHDL 3.6 07/15/2023 0922   LDLCALC 132 (H) 07/15/2023 0922    Clinical Atherosclerotic Cardiovascular Disease (ASCVD): No  The 10-year ASCVD risk score (Arnett DK, et al., 2019) is: 2%   Values used to calculate the score:     Age: 7 years     Sex: Female     Is Non-Hispanic African American: No     Diabetic: Yes     Tobacco smoker: No     Systolic Blood Pressure: 112 mmHg     Is BP treated: No     HDL Cholesterol: 57 mg/dL     Total Cholesterol: 206 mg/dL    A/P: Diabetes longstanding and currently uncontrolled with A1c 10.7% above goal <7%, but improved from 13.5% in Dec 2024. Patient reported FBG and PPG are controlled below goal since starting Trulicity . Patient did experience GI side effects, including vomiting, after first dose of Trulicity  which have since resolved. She is agreeable to continuing the current dose to avoid further insulin  titration. Will attempt to increase to 1.5 mg at follow-up. She is not having overt hyperglycemia, and denies s/sx of hypoglycemia. Patient is able to verbalize appropriate hypoglycemia management plan. Denies history of pancreatitis, denies personal or family hx of MTC or MEN II. BMI is WNL.  -Continued basal insulin  Basaglar  (insulin  glargine) 28 units daily at  bedtime.  -Continued rapid insulin  Humalog  (insulin  lispro) 8 units daily with her largest meal (breakfast) -Continued GLP-1 Trulicity  (dulaglutide ) 0.75 mg subcutaneous weekly. [facility] pharmacy is out of [facility] supply, but anticipates restock Wednesday. Patient informed and advised to restart Trulicity  on Wednesday if she is able to pick it up then.  -Continued metformin  IR 1000 mg PO daily.  -Medications sent to [facility] pharmacy for patient to pick up after appointment today -Patient educated on purpose, proper use, and potential adverse effects of Trulcity. Advised to eat smaller portions of food and stop eating when she feels full to reduce risk of GI AE. Advised to INCREASE water intake to 6-7 bottles daily and increase protein intake (I.e. adding egg or chicken to salad).  -Educated patient to call the office if she experiences hypoglycemia with Trulicity , and to call the office and stop taking Trulicity  if she has vomiting or severe abdominal pain.  -Extensively discussed pathophysiology of diabetes, recommended lifestyle interventions, dietary effects on blood sugar control.  -Counseled on s/sx of and management of hypoglycemia.  -Next A1c anticipated 01/13/23  ASCVD risk - primary  prevention in patient with diabetes. Last LDL is 132 mg/dL not at goal of <29  mg/dL. ASCVD risk factors include T2DM. 10-year ASCVD risk score of 1.8%. moderate to high intensity statin indicated.  -Continued atorvastatin  40 mg daily.  -Emphasized importance of adherence today  Written patient instructions provided. Patient verbalized understanding of treatment plan.  Total time in face to face counseling 35 minutes.    Follow-up:  PCP clinic visit 11/13/23 Pharmacist follow-up 12/08/23  Arthea Larsson, PharmD PGY1 Pharmacy Resident

## 2023-12-08 ENCOUNTER — Ambulatory Visit: Payer: Self-pay | Admitting: Pharmacist

## 2023-12-08 ENCOUNTER — Telehealth: Payer: Self-pay | Admitting: Internal Medicine

## 2023-12-08 NOTE — Telephone Encounter (Signed)
 Copied from CRM [id number]. Topic: Appointments - Appointment Cancel/Reschedule >> Dec 08, 2023  9:46 AM Christie Martinez wrote: Patient/patient representative is calling to cancel or reschedule an appointment. Refer to attachments for appointment information. Patient called using interpreter [phone]. She is unable to make her appt on 6/13 due to her sons graduation. Please f/u with new appt

## 2023-12-19 ENCOUNTER — Ambulatory Visit: Payer: Self-pay | Attending: Internal Medicine | Admitting: Internal Medicine

## 2023-12-19 ENCOUNTER — Encounter: Payer: Self-pay | Admitting: Internal Medicine

## 2023-12-19 ENCOUNTER — Other Ambulatory Visit: Payer: Self-pay

## 2023-12-19 VITALS — BP 112/75 | HR 87 | Temp 98.4°F | Ht <= 58 in | Wt 137.0 lb

## 2023-12-19 DIAGNOSIS — Z1211 Encounter for screening for malignant neoplasm of colon: Secondary | ICD-10-CM

## 2023-12-19 DIAGNOSIS — Z7985 Long-term (current) use of injectable non-insulin antidiabetic drugs: Secondary | ICD-10-CM

## 2023-12-19 DIAGNOSIS — Z7984 Long term (current) use of oral hypoglycemic drugs: Secondary | ICD-10-CM

## 2023-12-19 DIAGNOSIS — Z794 Long term (current) use of insulin: Secondary | ICD-10-CM

## 2023-12-19 DIAGNOSIS — E785 Hyperlipidemia, unspecified: Secondary | ICD-10-CM

## 2023-12-19 DIAGNOSIS — Z23 Encounter for immunization: Secondary | ICD-10-CM

## 2023-12-19 DIAGNOSIS — Z1231 Encounter for screening mammogram for malignant neoplasm of breast: Secondary | ICD-10-CM

## 2023-12-19 DIAGNOSIS — E1169 Type 2 diabetes mellitus with other specified complication: Secondary | ICD-10-CM

## 2023-12-19 MED ORDER — ATORVASTATIN CALCIUM 40 MG PO TABS
40.0000 mg | ORAL_TABLET | Freq: Every day | ORAL | 1 refills | Status: DC
Start: 2023-12-19 — End: 2024-04-20
  Filled 2023-12-19: qty 90, 90d supply, fill #0

## 2023-12-19 MED ORDER — TRULICITY 0.75 MG/0.5ML ~~LOC~~ SOAJ
0.7500 mg | SUBCUTANEOUS | 5 refills | Status: AC
Start: 2023-12-19 — End: ?
  Filled 2023-12-19: qty 2, 28d supply, fill #0

## 2023-12-19 NOTE — Progress Notes (Signed)
 Patient ID: Christie Martinez, female    DOB: 02/05/1971  MRN: [id number]  CC: Diabetes (DM f/u. /No questions / concerns/Yes to pneumonia vax)   Subjective: Christie Martinez is a 53 y.o. female who presents for chronic ds management. Her concerns today include:  Patient with history of DM type II (dx 04/2019), HL, migraine without aura, diverticulitis with local perforation in the descending colon 04/2019.   [employer] Language interpreter used during this encounter. #[id number] Atkinson  DM: Results for orders placed or performed in visit on 10/13/23  POCT glycosylated hemoglobin (Hb A1C)   Collection Time: 10/13/23  2:51 PM  Result Value Ref Range   Hemoglobin A1C     HbA1c POC (<> result, manual entry)     HbA1c, POC (prediabetic range)     HbA1c, POC (controlled diabetic range) 10.7 (A) 0.0 - 7.0 %  Should be on metformin  1 g daily, glargine insulin  28 units daily, Humalog  insulin  8 units with breakfast and Trulicity  0.75 mg once a week.  Reports compliance with taking all except out of Trulicity  for about a month.  States she was not sure that the pharmacy had it at the time.  Doing well with eating habits.  Check blood sugars before breakfast with range of 105-115.  After breakfast ranges 148-180.  Does a lot of walking 5 days a week on her job.  She works at a hotel.  Has eye exam scheduled for June at [facility]   HL: Reports compliance with taking atorvastatin .  Lipid profile done on last visit in December revealed LDL of 132.  Reports taking medication consistently since then.  HM: Due for mammogram, colon cancer screening and Prevnar 20.  Patient Active Problem List   Diagnosis Date Noted   Migraine without aura and without status migrainosus, not intractable 06/15/2020   Hyperlipidemia associated with type 2 diabetes mellitus (HCC) 07/06/2019   Type 2 diabetes mellitus without complication, with long-term current use of insulin  (HCC) 07/05/2019   History of  diverticulitis 07/05/2019   Abnormal LFTs 07/05/2019   Diverticulitis of colon with perforation 05/06/2019     Current Outpatient Medications on File Prior to Visit  Medication Sig Dispense Refill   atorvastatin  (LIPITOR) 40 MG tablet Take 1 tablet (40 mg total) by mouth daily. 90 tablet 1   blood glucose meter kit and supplies KIT Dispense based on patient and insurance preference. Use up to four times daily as directed. (FOR ICD-9 250.00, 250.01). 1 each 0   Dulaglutide  (TRULICITY ) 0.75 MG/0.5ML SOAJ Inject 0.75 mg into the skin once a week. 2 mL 5   glucose blood test strip Use as instructed to check blood sugar daily. 100 each 11   Insulin  Glargine (BASAGLAR  KWIKPEN) 100 UNIT/ML Inject 28 Units into the skin daily. 9 mL 3   insulin  lispro (HUMALOG  KWIKPEN) 100 UNIT/ML KwikPen Inject 8 Units into the skin 2 (two) times daily. 6 mL 2   Insulin  Pen Needle (TRUEPLUS 5-BEVEL PEN NEEDLES) 32G X 4 MM MISC Use to inject Basaglar  and Humalog . Max: 3 needles daily. 100 each 2   Insulin  Syringe-Needle U-100 (INSULIN  SYRINGE .5CC/31GX5/16") 31G X 5/16" 0.5 ML MISC Use to inject Levemir  15 units daily. 100 each 2   metFORMIN  (GLUCOPHAGE ) 1000 MG tablet Take 1 tablet (1,000 mg total) by mouth daily with breakfast. 90 tablet 1   TRUEplus Lancets 28G MISC Use to check blood sugar 3 times daily 100 each 2   No current facility-administered medications  on file prior to visit.    No Known Allergies  Social History   Socioeconomic History   Marital status: Married    Spouse name: Not on file   Number of children: 3   Years of education: Not on file   Highest education level: 9th grade  Occupational History   Not on file  Tobacco Use   Smoking status: Never   Smokeless tobacco: Never  Vaping Use   Vaping status: Never Used  Substance and Sexual Activity   Alcohol use: No   Drug use: Never   Sexual activity: Yes    Birth control/protection: Post-menopausal  Other Topics Concern   Not on  file  Social History Narrative   Not on file   Social Drivers of Health   Financial Resource Strain: Not on file  Food Insecurity: No Food Insecurity (11/14/2022)   Hunger Vital Sign    Worried About Running Out of Food in the Last Year: Never true    Ran Out of Food in the Last Year: Never true  Transportation Needs: No Transportation Needs (11/14/2022)   PRAPARE - Administrator, Civil Service (Medical): No    Lack of Transportation (Non-Medical): No  Physical Activity: Insufficiently Active (11/14/2022)   Exercise Vital Sign    Days of Exercise per Week: 7 days    Minutes of Exercise per Session: 20 min  Stress: No Stress Concern Present (11/14/2022)   [employer] of Occupational Health - Occupational Stress Questionnaire    Feeling of Stress : Not at all  Social Connections: Moderately Integrated (11/14/2022)   Social Connection and Isolation Panel [NHANES]    Frequency of Communication with Friends and Family: More than three times a week    Frequency of Social Gatherings with Friends and Family: More than three times a week    Attends Religious Services: More than 4 times per year    Active Member of [employer] or Organizations: No    Attends Banker Meetings: Never    Marital Status: Married  Catering manager Violence: Not At Risk (11/14/2022)   Humiliation, Afraid, Rape, and Kick questionnaire    Fear of Current or Ex-Partner: No    Emotionally Abused: No    Physically Abused: No    Sexually Abused: No    Family History  Problem Relation Age of Onset   Diabetes Father     No past surgical history on file.  ROS: Review of Systems Negative except as stated above  PHYSICAL EXAM: BP 112/75 (BP Location: Left Arm, Patient Position: Sitting, Cuff Size: Normal)   Pulse 87   Temp 98.4 F (36.9 C) (Oral)   Ht 4\' 10"  (1.473 m)   Wt 137 lb (62.1 kg)   LMP 11/12/2016   SpO2 98%   BMI 28.63 kg/m   Wt Readings from Last 3 Encounters:  12/19/23  137 lb (62.1 kg)  07/15/23 124 lb (56.2 kg)  01/20/23 140 lb (63.5 kg)    Physical Exam  General appearance - alert, well appearing, and in no distress Mental status - normal mood, behavior, speech, dress, motor activity, and thought processes Neck - supple, no significant adenopathy Chest - clear to auscultation, no wheezes, rales or rhonchi, symmetric air entry Heart - normal rate, regular rhythm, normal S1, S2, no murmurs, rubs, clicks or gallops Extremities - peripheral pulses normal, no pedal edema, no clubbing or cyanosis      Latest Ref Rng & Units 07/15/2023  9:22 AM 12/22/2022    7:21 PM 05/10/2022   11:09 AM  CMP  Glucose 70 - 99 mg/dL 161  096  045   BUN 6 - 24 mg/dL 9  9  12    Creatinine 0.57 - 1.00 mg/dL 4.09  8.11  9.14   Sodium 134 - 144 mmol/L 136  136  137   Potassium 3.5 - 5.2 mmol/L 3.8  3.4  4.2   Chloride 96 - 106 mmol/L 98  100  97   CO2 20 - 29 mmol/L 24  23  24    Calcium  8.7 - 10.2 mg/dL 9.4  9.3  9.4   Total Protein 6.0 - 8.5 g/dL 6.9  7.8    Total Bilirubin 0.0 - 1.2 mg/dL 0.6  0.8    Alkaline Phos 44 - 121 IU/L 138  92    AST 0 - 40 IU/L 19  15    ALT 0 - 32 IU/L 16  18     Lipid Panel     Component Value Date/Time   CHOL 206 (H) 07/15/2023 0922   TRIG 94 07/15/2023 0922   HDL 57 07/15/2023 0922   CHOLHDL 3.6 07/15/2023 0922   LDLCALC 132 (H) 07/15/2023 0922    CBC    Component Value Date/Time   WBC 5.0 07/15/2023 0922   WBC 9.5 12/22/2022 1921   RBC 4.64 07/15/2023 0922   RBC 4.48 12/22/2022 1921   HGB 13.9 07/15/2023 0922   HCT 41.1 07/15/2023 0922   PLT 285 07/15/2023 0922   MCV 89 07/15/2023 0922   MCH 30.0 07/15/2023 0922   MCH 29.9 12/22/2022 1921   MCHC 33.8 07/15/2023 0922   MCHC 35.2 12/22/2022 1921   RDW 12.2 07/15/2023 0922   LYMPHSABS 1.7 09/10/2021 1054   MONOABS 0.4 05/09/2019 0415   EOSABS 0.1 09/10/2021 1054   BASOSABS 0.0 09/10/2021 1054    ASSESSMENT AND PLAN: 1. Type 2 diabetes mellitus with other  specified complication, with long-term current use of insulin  (HCC) (Primary) Last A1c had improved but not at goal. Reported home blood sugar readings are good even though she did not bring log with her today.  Encouraged her to write down her numbers and bring them with her on subsequent visit when she sees me or the clinical pharmacist. Out of Trulicity  x 1 month.  Refill sent to the pharmacy today.  Continue metformin  1 g daily, glargine insulin  28 units daily and Humalog  8 units with breakfast. Encouraged her to continue healthy eating habits and trying to move as much as she can. - Dulaglutide  (TRULICITY ) 0.75 MG/0.5ML SOAJ; Inject 0.75 mg into the skin once a week.  Dispense: 2 mL; Refill: 5  2. Long term (current) use of oral hypoglycemic drugs 3. Long-term (current) use of injectable non-insulin  antidiabetic drugs See #1 above.  4. Hyperlipidemia associated with type 2 diabetes mellitus (HCC) Continue atorvastatin  40 mg daily.  5. Encounter for screening mammogram for malignant neoplasm of breast Mammogram scholarship given. - MM 3D SCREENING MAMMOGRAM BILATERAL BREAST; Future  6. Screening for colon cancer FIT test given  7. Need for vaccination against Streptococcus pneumoniae Prevnar 20 vaccine given today by CMA  .   Patient was given the opportunity to ask questions.  Patient verbalized understanding of the plan and was able to repeat key elements of the plan.   This documentation was completed using Paediatric nurse.  Any transcriptional errors are unintentional.  No orders of the defined types  were placed in this encounter.    Requested Prescriptions    No prescriptions requested or ordered in this encounter    No follow-ups on file.  Concetta Dee, MD, FACP

## 2023-12-20 ENCOUNTER — Other Ambulatory Visit: Payer: Self-pay

## 2023-12-23 ENCOUNTER — Other Ambulatory Visit: Payer: Self-pay

## 2023-12-30 ENCOUNTER — Telehealth: Payer: Self-pay

## 2023-12-30 NOTE — Telephone Encounter (Signed)
Telephoned patient at mobile number using interpreter, Orie Fisherman. Left a voice message with [facility] (scholarship) contact information.

## 2024-01-01 ENCOUNTER — Ambulatory Visit: Payer: Self-pay | Admitting: Nurse Practitioner

## 2024-01-01 LAB — FECAL OCCULT BLOOD, IMMUNOCHEMICAL: Fecal Occult Bld: NEGATIVE

## 2024-01-09 ENCOUNTER — Ambulatory Visit: Payer: Self-pay | Admitting: Pharmacist

## 2024-01-11 NOTE — Progress Notes (Signed)
 S:     No chief complaint on file.  53 y.o. female who presents for diabetes evaluation, education, and management. Patient arrives in good spirits and presents without  any assistance.   Interpreter: Dayna, #[id number]  Patient was referred and last seen by Primary Care Provider, Dr. Lincoln Renshaw, on 07/15/23. Patient was last seen by pharmacy in May 2024. She has had a gap in care due to appt cancellations. PMH is significant for T2DM, HLD, migraine without aura, diverticulitis with local perforation in the descending colon in 2020.    At last visit with Dr. Lincoln Renshaw, A1c had worsened from 9.0% to 13.5%. Pt attributed change to increased stress and medication nonadherence - she was only taking metformin  ~3x/week and taking both her insulins ~4x/week. She would fall asleep at night before taking her insulin , and also reported that the fridge at her work was not working properly, so she had not been taking her meal time insulin  to work with her. She had lost 16 lbs. She was also not taking atorvastatin  consistently. She was instructed to obtain a pill box for her oral medications, and counseled that her insulin  can be out at room temperature for 28 days. At pharmacist appt on 09/15/23 - emphasized importance of adherence and initiated Trulicity  via [facility] supply. Last seen by PCP on 12/19/23, at which point she endorsed being out of Trulicity  for 1 month so this was re-initiated at low dose.   Today patient reports doing well. She endorses having a mild headache after restarting Trulicity  but is able to treat with Tylenol .  She has taken 4 injections (administers on Saturday) since she saw PCP. Reports taking Basaglar  28 units at night. Doesn't use Humalog  every day because she has noticed BG is lower, but does use it 3 times a week. Does endorse dizziness and feeling like sugar is dropping and needs to eat something sweet; she gets hot at work and thinks this may be contributing to lows.   Patient reports  Diabetes was diagnosed in 2023.   Family/Social History:  Family hx: DM in father  Current diabetes medications include: insulin  glargine (Basaglar ) 28 units at bedtime, insulin  lispro (Humalog ) 8 units before breakfast, metformin  IR 1000 mg daily with breakfast, Trulicity  0.75 mg subcutaneous weekly (Mondays)  Previous DM medications: cannot tolerate metformin  IR 1000 mg BID (GI)  Current hypertension medications include: none  Current hyperlipidemia medications include: atorvastatin  40 mg PO daily (reports she has excess supply at home)  Patient reports adherence to taking all medications as prescribed. Fill history suggests patient may be due for refills, but she reports she only needs refills on Trulicity .   Insurance coverage: none - DOH patient   Patient denies hypoglycemic events - denies BG < 70. Reports that she does endorse feelings of hypoglycemia when BG is 90.   Reports she is checking her BG at home - daily fasting and post-prandial glucose  Reported home fasting blood sugars: lowest 90, highest 150 mg/dL Reported 2 hour post-meal/random blood sugars: 145-180 mg/dL   Patient endorses nocturia (nighttime urination), 1x/night.  Patient denies neuropathy (nerve pain). Patient denies visual changes. Patient reports self foot exams.   Patient reported dietary habits: Eats 3 meals/day Breakfast: two eggs, coffee, 3 tortillas Lunch: buys at her job - usually 1 pizza with vegetables, or rice with chicken, recently has been eating a salad at lunch without protein Dinner: often 1 portion of rice and meat,  Snacks: minimal Drinks: Reports drinking 4-5 bottles of  water per day. Cut out sugary beverages.   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   O:   Lab Results  Component Value Date   HGBA1C 6.8 01/12/2024   There were no vitals filed for this visit.  Lipid Panel     Component Value Date/Time    CHOL 206 (H) 07/15/2023 0922   TRIG 94 07/15/2023 0922   HDL 57 07/15/2023 0922   CHOLHDL 3.6 07/15/2023 0922   LDLCALC 132 (H) 07/15/2023 0922    Clinical Atherosclerotic Cardiovascular Disease (ASCVD): No  The 10-year ASCVD risk score (Arnett DK, et al., 2019) is: 2%   Values used to calculate the score:     Age: 12 years     Clincally relevant sex: Female     Is Non-Hispanic African American: No     Diabetic: Yes     Tobacco smoker: No     Systolic Blood Pressure: 112 mmHg     Is BP treated: No     HDL Cholesterol: 57 mg/dL     Total Cholesterol: 206 mg/dL    A/P: Diabetes longstanding and currently controlled with A1c 6.8%, at goal <7%, much improved from 13.5% in Dec 2024. Commended her for this improvement! Patient reported FBG and PPG are controlled below goal since resuming Trulicity . She is agreeable to increasing Trulicity  but advised her to reach out if she starts to have side effects. She is not having overt hyperglycemia but does endorse s/sx of hypoglycemia. Patient is able to verbalize appropriate hypoglycemia management plan. Denies history of pancreatitis, denies personal or family hx of MTC or MEN II. BMI is WNL.  -Continued basal insulin  Basaglar  (insulin  glargine) 28 units daily at bedtime -Discontinue Humalog   -Increase Trulicity  (dulaglutide ) to 1.5 mg subcutaneous weekly.  -Continued metformin  IR 1000 mg PO daily. -Medications sent to [facility] pharmacy for patient to pick up after appointment today -Patient educated on purpose, proper use, and potential adverse effects of Trulcity. Advised to eat smaller portions of food and stop eating when she feels full to reduce risk of GI AE. Advised to INCREASE water intake to 6-7 bottles daily and increase protein intake (I.e. adding egg or chicken to salad).  -Educated patient to call the office if she experiences hypoglycemia with Trulicity , and to call the office and stop taking Trulicity  if she has vomiting or severe  abdominal pain.  -Extensively discussed pathophysiology of diabetes, recommended lifestyle interventions, dietary effects on blood sugar control.  -Counseled on s/sx of and management of hypoglycemia.  -Next A1c anticipated 03/2024  ASCVD risk - primary prevention in patient with diabetes. Last LDL is 132 mg/dL not at goal of <16  mg/dL. ASCVD risk factors include T2DM. 10-year ASCVD risk score of 1.8%. moderate to high intensity statin indicated.  -Continued atorvastatin  40 mg daily.  -Emphasized importance of adherence today  Written patient instructions provided. Patient verbalized understanding of treatment plan.  Total time in face to face counseling 30 minutes.    Follow-up:  PCP clinic visit 04/20/2024 Pharmacist follow-up 1 month  Juleen Oakland, PharmD PGY1 Pharmacy Resident

## 2024-01-12 ENCOUNTER — Ambulatory Visit: Payer: Self-pay | Attending: Internal Medicine | Admitting: Pharmacist

## 2024-01-12 ENCOUNTER — Encounter: Payer: Self-pay | Admitting: Pharmacist

## 2024-01-12 ENCOUNTER — Other Ambulatory Visit: Payer: Self-pay

## 2024-01-12 DIAGNOSIS — E1169 Type 2 diabetes mellitus with other specified complication: Secondary | ICD-10-CM

## 2024-01-12 DIAGNOSIS — Z794 Long term (current) use of insulin: Secondary | ICD-10-CM

## 2024-01-12 DIAGNOSIS — Z7985 Long-term (current) use of injectable non-insulin antidiabetic drugs: Secondary | ICD-10-CM

## 2024-01-12 DIAGNOSIS — E785 Hyperlipidemia, unspecified: Secondary | ICD-10-CM

## 2024-01-12 DIAGNOSIS — Z7984 Long term (current) use of oral hypoglycemic drugs: Secondary | ICD-10-CM

## 2024-01-12 LAB — POCT GLYCOSYLATED HEMOGLOBIN (HGB A1C): HbA1c, POC (controlled diabetic range): 6.8 % (ref 0.0–7.0)

## 2024-01-12 MED ORDER — TRULICITY 1.5 MG/0.5ML ~~LOC~~ SOAJ
1.5000 mg | SUBCUTANEOUS | 1 refills | Status: DC
  Filled 2024-01-12: qty 2, 28d supply, fill #0
  Filled 2024-02-13: qty 2, 28d supply, fill #1
  Filled 2024-03-11: qty 2, 28d supply, fill #2
  Filled 2024-04-08: qty 2, 28d supply, fill #3

## 2024-02-13 ENCOUNTER — Other Ambulatory Visit: Payer: Self-pay

## 2024-02-13 ENCOUNTER — Encounter: Payer: Self-pay | Admitting: Pharmacist

## 2024-02-13 ENCOUNTER — Ambulatory Visit: Payer: Self-pay | Attending: Internal Medicine | Admitting: Pharmacist

## 2024-02-13 DIAGNOSIS — E119 Type 2 diabetes mellitus without complications: Secondary | ICD-10-CM

## 2024-02-13 DIAGNOSIS — Z794 Long term (current) use of insulin: Secondary | ICD-10-CM

## 2024-02-13 DIAGNOSIS — Z7984 Long term (current) use of oral hypoglycemic drugs: Secondary | ICD-10-CM

## 2024-02-13 DIAGNOSIS — Z7985 Long-term (current) use of injectable non-insulin antidiabetic drugs: Secondary | ICD-10-CM

## 2024-02-13 NOTE — Progress Notes (Signed)
 S:     No chief complaint on file.  53 y.o. female who presents for diabetes evaluation, education, and management. Patient arrives in good spirits and presents without  any assistance. PMH is significant for T2DM, HLD, migraine without aura, diverticulitis with local perforation in the descending colon in 2020.  Interpreter: Alfonso Bence, #[id number]  Patient was referred and last seen by Primary Care Provider, Dr. Vicci, on 12/19/2023. Patient was last seen by pharmacy 01/12/2024.   At that visit with us  01/12/2024 A1c was 6.8 (down from 10.7%). She reported doing well. Endorsed adherence to Trulicity  and Basaglar . Was only using Humalog  ~3x weekly due to symptoms of hypoglycemia. We had her stop the Humalog  and increase Trulicity  to 1.5 mg weekly. Continue metformin  and Basaglar  at their doses.   Today patient reports doing well. She stopped the Humalog  as instructed. She has started the higher dose of Trulicity . Today, she denies any NV, abdominal pain, or changes in vision.   Family/Social History:  Family hx: DM in father  Current diabetes medications include: insulin  glargine (Basaglar ) 28 units at bedtime, metformin  IR 1000 mg daily with breakfast, Trulicity  1.5 mg subcutaneous weekly (Mondays) Patient reports adherence to taking all medications as prescribed.   Insurance coverage: none - [facility]H patient   Patient denies hypoglycemic events.  Reports she is checking her BG at home - daily fasting and post-prandial glucose  Reported home fasting blood sugars: gives range of 108 - 120 mg/dL Reported 2 hour post-meal/random blood sugars: 140-180 mg/dL   Patient denies polyuria..  Patient denies neuropathy (nerve pain). Patient denies visual changes. Patient reports self foot exams.   Patient reported dietary habits: Eats 3 meals/day Breakfast: two eggs, coffee, 3 tortillas Lunch: buys at her job - usually 1 pizza with vegetables, or rice with chicken, recently has been eating  a salad at lunch without protein Dinner: often 1 portion of rice and meat,  Snacks: minimal Drinks: Reports drinking 4-5 bottles of water per day. Cut out sugary beverages.   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   O:  Lab Results  Component Value Date   HGBA1C 6.8 01/12/2024   There were no vitals filed for this visit.  Lipid Panel     Component Value Date/Time   CHOL 206 (H) 07/15/2023 0922   TRIG 94 07/15/2023 0922   HDL 57 07/15/2023 0922   CHOLHDL 3.6 07/15/2023 0922   LDLCALC 132 (H) 07/15/2023 0922    Clinical Atherosclerotic Cardiovascular Disease (ASCVD): No  The 10-year ASCVD risk score (Arnett DK, et al., 2019) is: 2%   Values used to calculate the score:     Age: 65 years     Clincally relevant sex: Female     Is Non-Hispanic African American: No     Diabetic: Yes     Tobacco smoker: No     Systolic Blood Pressure: 112 mmHg     Is BP treated: No     HDL Cholesterol: 57 mg/dL     Total Cholesterol: 206 mg/dL    A/P: Diabetes longstanding and currently controlled with A1c 6.8%. Commended her for this improvement! Patient reported FBG and PPG are controlled. She is agreeable to continuing medication at this time. She is not having hyperglycemia or hypoglycemia since seeing us  last month. Patient is able to verbalize appropriate hypoglycemia management plan -Continued basal insulin  Basaglar  (insulin  glargine) 28 units daily at bedtime -Continued Trulicity  (dulaglutide )  1.5 mg subcutaneous weekly.  -Continued metformin  IR 1000 mg PO daily. -Extensively discussed pathophysiology of diabetes, recommended lifestyle interventions, dietary effects on blood sugar control.  -Counseled on s/sx of and management of hypoglycemia.  -Next A1c anticipated 03/2024  Written patient instructions provided. Patient verbalized understanding of treatment plan.  Total time in face to face counseling 30  minutes.    Follow-up:  PCP clinic visit 04/20/2024 Pharmacist follow-up prn.  Herlene Fleeta Morris, PharmD, JAQUELINE, CPP Clinical Pharmacist [facility] [phone]

## 2024-03-04 ENCOUNTER — Ambulatory Visit
Admission: RE | Admit: 2024-03-04 | Discharge: 2024-03-04 | Disposition: A | Discharge status: Home / Self Care | Payer: Self-pay | Source: Ambulatory Visit | Attending: Internal Medicine | Admitting: Internal Medicine

## 2024-03-04 DIAGNOSIS — Z1231 Encounter for screening mammogram for malignant neoplasm of breast: Secondary | ICD-10-CM

## 2024-03-11 ENCOUNTER — Other Ambulatory Visit: Payer: Self-pay

## 2024-04-08 ENCOUNTER — Other Ambulatory Visit: Payer: Self-pay

## 2024-04-20 ENCOUNTER — Ambulatory Visit: Payer: Self-pay | Attending: Internal Medicine | Admitting: Internal Medicine

## 2024-04-20 ENCOUNTER — Other Ambulatory Visit: Payer: Self-pay

## 2024-04-20 ENCOUNTER — Encounter: Payer: Self-pay | Admitting: Internal Medicine

## 2024-04-20 VITALS — BP 120/76 | HR 71 | Temp 98.3°F | Ht <= 58 in | Wt 143.0 lb

## 2024-04-20 DIAGNOSIS — Z7984 Long term (current) use of oral hypoglycemic drugs: Secondary | ICD-10-CM

## 2024-04-20 DIAGNOSIS — Z794 Long term (current) use of insulin: Secondary | ICD-10-CM

## 2024-04-20 DIAGNOSIS — Z7985 Long-term (current) use of injectable non-insulin antidiabetic drugs: Secondary | ICD-10-CM

## 2024-04-20 DIAGNOSIS — E1169 Type 2 diabetes mellitus with other specified complication: Secondary | ICD-10-CM

## 2024-04-20 DIAGNOSIS — Z23 Encounter for immunization: Secondary | ICD-10-CM

## 2024-04-20 DIAGNOSIS — E119 Type 2 diabetes mellitus without complications: Secondary | ICD-10-CM

## 2024-04-20 DIAGNOSIS — E785 Hyperlipidemia, unspecified: Secondary | ICD-10-CM

## 2024-04-20 DIAGNOSIS — M2550 Pain in unspecified joint: Secondary | ICD-10-CM

## 2024-04-20 LAB — POCT GLYCOSYLATED HEMOGLOBIN (HGB A1C): HbA1c, POC (controlled diabetic range): 6.7 % (ref 0.0–7.0)

## 2024-04-20 LAB — GLUCOSE, POCT (MANUAL RESULT ENTRY): POC Glucose: 209 mg/dL — AB (ref 70–99)

## 2024-04-20 MED ORDER — TRULICITY 1.5 MG/0.5ML ~~LOC~~ SOAJ
1.5000 mg | SUBCUTANEOUS | 1 refills | Status: AC
Start: 2024-04-20 — End: ?
  Filled 2024-04-20: qty 6, 84d supply, fill #0
  Filled 2024-05-11: qty 2, 28d supply, fill #0
  Filled 2024-06-08: qty 2, 28d supply, fill #1
  Filled 2024-07-08: qty 2, 28d supply, fill #2
  Filled 2024-08-12: qty 2, 28d supply, fill #3

## 2024-04-20 MED ORDER — ATORVASTATIN CALCIUM 40 MG PO TABS
40.0000 mg | ORAL_TABLET | Freq: Every day | ORAL | 1 refills | Status: DC
Start: 2024-04-20 — End: 2024-04-21
  Filled 2024-04-20: qty 90, 90d supply, fill #0

## 2024-04-20 MED ORDER — METFORMIN HCL 1000 MG PO TABS
1000.0000 mg | ORAL_TABLET | Freq: Every day | ORAL | 1 refills | Status: AC
Start: 2024-04-20 — End: ?
  Filled 2024-04-20: qty 90, 90d supply, fill #0

## 2024-04-20 NOTE — Progress Notes (Signed)
 Patient ID: Christie Martinez, female    DOB: Feb 25, 1971  MRN: [id number]  CC: Diabetes (DM f/u. Med refills. /Joint pain - requesting rx/Yes to flu vax)   Subjective: Christie Martinez is a 53 y.o. female who presents for chronic ds management. Her concerns today include:  Patient with history of DM type II (dx 04/2019), HL, migraine without aura, diverticulitis with local perforation in the descending colon 04/2019.   [employer] Language interpreter used during this encounter. #[id number], Ana  Discussed the use of AI scribe software for clinical note transcription with the patient, who gave verbal consent to proceed.  History of Present Illness   Christie Martinez is a 53 year old female with diabetes and hyperlipidemia who presents for follow-up.  DM: Results for orders placed or performed in visit on 04/20/24  POCT glucose (manual entry)   Collection Time: 04/20/24  9:29 AM  Result Value Ref Range   POC Glucose 209 (A) 70 - 99 mg/dl  POCT glycosylated hemoglobin (Hb A1C)   Collection Time: 04/20/24  9:30 AM  Result Value Ref Range   Hemoglobin A1C     HbA1c POC (<> result, manual entry)     HbA1c, POC (prediabetic range)     HbA1c, POC (controlled diabetic range) 6.7 0.0 - 7.0 %   Her blood sugar this morning was 209 mg/dL after eating breakfast at 7:30 AM, which included eggs, two tortillas, and coffee. She checks her blood sugar before and after breakfast (has log with her), with pre-breakfast readings typically between 95 to 120 mg/dL, but recently between 825 to 258 mg/dL after breakfast. She checks her blood sugar about half an hour after eating. Compliant with current medications  Trulicity  1.5 mg once a week, metformin  1000 mg once a day, and glycine insulin  28 units once a day. She is mindful of her diet, avoiding sugary drinks and snacks, but notes that bananas cause her blood sugar to rise. She engages in daily physical activity, walking her dog after  work.  She has had an eye exam by [facility] inside of [facility] 12/2023. She brings copy of the rxn written. It was not a dilated/comprehensive eye exam.  HL: She is taking atorvastatin  40 mg daily for cholesterol management.  She reports joint pain in her shoulders, knees, and hands for the past two weeks, with occasional swelling in her shoulder but no stiffness. The pain occurs daily. She reports that she uses her right arm more at work. She tried a juice from [employer] for relief but found it too sweet and ineffective. No joint stiffness.      Patient Active Problem List   Diagnosis Date Noted   Migraine without aura and without status migrainosus, not intractable 06/15/2020   Hyperlipidemia associated with type 2 diabetes mellitus (HCC) 07/06/2019   Type 2 diabetes mellitus without complication, with long-term current use of insulin  (HCC) 07/05/2019   History of diverticulitis 07/05/2019   Abnormal LFTs 07/05/2019   Diverticulitis of colon with perforation 05/06/2019     Current Outpatient Medications on File Prior to Visit  Medication Sig Dispense Refill   blood glucose meter kit and supplies KIT Dispense based on patient and insurance preference. Use up to four times daily as directed. (FOR ICD-9 250.00, 250.01). 1 each 0   glucose blood test strip Use as instructed to check blood sugar daily. 100 each 11   Insulin  Glargine (BASAGLAR  KWIKPEN) 100 UNIT/ML Inject 28 Units into the skin daily.  9 mL 3   Insulin  Pen Needle (TRUEPLUS 5-BEVEL PEN NEEDLES) 32G X 4 MM MISC Use to inject Basaglar  and Humalog . Max: 3 needles daily. 100 each 2   Insulin  Syringe-Needle U-100 (INSULIN  SYRINGE .5CC/31GX5/16) 31G X 5/16 0.5 ML MISC Use to inject Levemir  15 units daily. 100 each 2   TRUEplus Lancets 28G MISC Use to check blood sugar 3 times daily 100 each 2   No current facility-administered medications on file prior to visit.    No Known Allergies  Social History   Socioeconomic History    Marital status: Married    Spouse name: Not on file   Number of children: 3   Years of education: Not on file   Highest education level: 9th grade  Occupational History   Not on file  Tobacco Use   Smoking status: Never   Smokeless tobacco: Never  Vaping Use   Vaping status: Never Used  Substance and Sexual Activity   Alcohol use: No   Drug use: Never   Sexual activity: Yes    Birth control/protection: Post-menopausal  Other Topics Concern   Not on file  Social History Narrative   Not on file   Social Drivers of Health   Financial Resource Strain: Not on file  Food Insecurity: No Food Insecurity (11/14/2022)   Hunger Vital Sign    Worried About Running Out of Food in the Last Year: Never true    Ran Out of Food in the Last Year: Never true  Transportation Needs: No Transportation Needs (11/14/2022)   PRAPARE - Administrator, Civil Service (Medical): No    Lack of Transportation (Non-Medical): No  Physical Activity: Insufficiently Active (11/14/2022)   Exercise Vital Sign    Days of Exercise per Week: 7 days    Minutes of Exercise per Session: 20 min  Stress: No Stress Concern Present (11/14/2022)   [employer] of Occupational Health - Occupational Stress Questionnaire    Feeling of Stress : Not at all  Social Connections: Moderately Integrated (11/14/2022)   Social Connection and Isolation Panel    Frequency of Communication with Friends and Family: More than three times a week    Frequency of Social Gatherings with Friends and Family: More than three times a week    Attends Religious Services: More than 4 times per year    Active Member of [employer] or Organizations: No    Attends Banker Meetings: Never    Marital Status: Married  Catering manager Violence: Not At Risk (11/14/2022)   Humiliation, Afraid, Rape, and Kick questionnaire    Fear of Current or Ex-Partner: No    Emotionally Abused: No    Physically Abused: No    Sexually Abused: No     Family History  Problem Relation Age of Onset   Diabetes Father     No past surgical history on file.  ROS: Review of Systems Negative except as stated above  PHYSICAL EXAM: BP 120/76 (BP Location: Left Arm, Patient Position: Sitting, Cuff Size: Normal)   Pulse 71   Temp 98.3 F (36.8 C) (Oral)   Ht 4' 10 (1.473 m)   Wt 143 lb (64.9 kg)   LMP 11/12/2016   SpO2 100%   BMI 29.89 kg/m   Wt Readings from Last 3 Encounters:  04/20/24 143 lb (64.9 kg)  12/19/23 137 lb (62.1 kg)  07/15/23 124 lb (56.2 kg)    Physical Exam  General appearance - alert, well appearing, middle  age Hispanic female. and in no distress Mental status - normal mood, behavior, speech, dress, motor activity, and thought processes Neck - supple, no significant adenopathy Chest - clear to auscultation, no wheezes, rales or rhonchi, symmetric air entry Heart - normal rate, regular rhythm, normal S1, S2, no murmurs, rubs, clicks or gallops Musculoskeletal - No swelling or inflammation of the hands, wrists, knees and shoulders.  Good ROM of all of these jts Extremities - peripheral pulses normal, no pedal edema, no clubbing or cyanosis      Latest Ref Rng & Units 07/15/2023    9:22 AM 12/22/2022    7:21 PM 05/10/2022   11:09 AM  CMP  Glucose 70 - 99 mg/dL 807  805  674   BUN 6 - 24 mg/dL 9  9  12    Creatinine 0.57 - 1.00 mg/dL 9.45  9.51  9.34   Sodium 134 - 144 mmol/L 136  136  137   Potassium 3.5 - 5.2 mmol/L 3.8  3.4  4.2   Chloride 96 - 106 mmol/L 98  100  97   CO2 20 - 29 mmol/L 24  23  24    Calcium  8.7 - 10.2 mg/dL 9.4  9.3  9.4   Total Protein 6.0 - 8.5 g/dL 6.9  7.8    Total Bilirubin 0.0 - 1.2 mg/dL 0.6  0.8    Alkaline Phos 44 - 121 IU/L 138  92    AST 0 - 40 IU/L 19  15    ALT 0 - 32 IU/L 16  18     Lipid Panel     Component Value Date/Time   CHOL 206 (H) 07/15/2023 0922   TRIG 94 07/15/2023 0922   HDL 57 07/15/2023 0922   CHOLHDL 3.6 07/15/2023 0922   LDLCALC 132 (H)  07/15/2023 0922    CBC    Component Value Date/Time   WBC 5.0 07/15/2023 0922   WBC 9.5 12/22/2022 1921   RBC 4.64 07/15/2023 0922   RBC 4.48 12/22/2022 1921   HGB 13.9 07/15/2023 0922   HCT 41.1 07/15/2023 0922   PLT 285 07/15/2023 0922   MCV 89 07/15/2023 0922   MCH 30.0 07/15/2023 0922   MCH 29.9 12/22/2022 1921   MCHC 33.8 07/15/2023 0922   MCHC 35.2 12/22/2022 1921   RDW 12.2 07/15/2023 0922   LYMPHSABS 1.7 09/10/2021 1054   MONOABS 0.4 05/09/2019 0415   EOSABS 0.1 09/10/2021 1054   BASOSABS 0.0 09/10/2021 1054    ASSESSMENT AND PLAN: 1. Type 2 diabetes mellitus without complication, with long-term current use of insulin  (HCC) (Primary) A1C at goal. Fasting BS at goal. Advise to check 2 hrs after meal when she checks post meals with goal being <180. Continue to stay active. Continue Metformin  1 gram daily, Trulicity  1.5 mg Q wk and Glargine insulin  28 units daily  Advise of getting a complete dilated eye exam done for future eye exams - POCT glycosylated hemoglobin (Hb A1C) - POCT glucose (manual entry) - metFORMIN  (GLUCOPHAGE ) 1000 MG tablet; Take 1 tablet (1,000 mg total) by mouth daily with breakfast.  Dispense: 90 tablet; Refill: 1 - Dulaglutide  (TRULICITY ) 1.5 MG/0.5ML SOAJ; Inject 1.5 mg into the skin once a week.  Dispense: 6 mL; Refill: 1  2. Long term (current) use of oral hypoglycemic drugs 3. Long-term (current) use of injectable non-insulin  antidiabetic drugs See # 1 above  4. Hyperlipidemia associated with type 2 diabetes mellitus (HCC) Last LDL 132 06/2023 which was not at  goal Recheck Lipid and chem today Encouraged to continue healthy eating habits and regular exercise - atorvastatin  (LIPITOR) 40 MG tablet; Take 1 tablet (40 mg total) by mouth daily.  Dispense: 90 tablet; Refill: 1  5. Polyarthralgia Exam today unrevealing; may be work related. Will monitor for now. Advise to use Ibuprofen  as needed.  6. Need for immunization against influenza -  Flu vaccine trivalent PF, 6mos and older(Flulaval,Afluria,Fluarix,Fluzone)  Patient was given the opportunity to ask questions.  Patient verbalized understanding of the plan and was able to repeat key elements of the plan.   This documentation was completed using Paediatric nurse.  Any transcriptional errors are unintentional.  Orders Placed This Encounter  Procedures   Flu vaccine trivalent PF, 6mos and older(Flulaval,Afluria,Fluarix,Fluzone)   POCT glycosylated hemoglobin (Hb A1C)   POCT glucose (manual entry)     Requested Prescriptions   Signed Prescriptions Disp Refills   metFORMIN  (GLUCOPHAGE ) 1000 MG tablet 90 tablet 1    Sig: Take 1 tablet (1,000 mg total) by mouth daily with breakfast.   Dulaglutide  (TRULICITY ) 1.5 MG/0.5ML SOAJ 6 mL 1    Sig: Inject 1.5 mg into the skin once a week.   atorvastatin  (LIPITOR) 40 MG tablet 90 tablet 1    Sig: Take 1 tablet (40 mg total) by mouth daily.    Return in about 4 months (around 08/20/2024).  Barnie Louder, MD, FACP

## 2024-04-20 NOTE — Patient Instructions (Signed)
  VISIT SUMMARY: Today, we reviewed your diabetes and cholesterol management, and discussed your recent joint pain. Your A1c has improved to 6.7, which is a positive change. We also talked about your blood sugar levels and made some adjustments to your monitoring routine and diet. Additionally, we addressed your joint pain and provided recommendations for relief.  YOUR PLAN: -TYPE 2 DIABETES MELLITUS WITH HYPERGLYCEMIA: Type 2 diabetes is a condition where your body does not use insulin  properly, leading to high blood sugar levels. Your A1c has improved to 6.7, but your fasting and post-meal blood sugar levels are still high. We will continue your current medications: Trulicity  1.5 mg weekly, Metformin  1000 mg daily, and Glycine insulin  28 units daily. Please check your blood sugar two hours after meals, aiming for levels below 180 mg/dL. We also recommend dietary changes, such as choosing fruits with lower sugar content and eating bananas when they are firm. Keep up with your regular exercise, like daily walking.  -JOINT PAIN: You have been experiencing joint pain in your shoulders, knees, and hands, but there is no sign of inflammatory arthritis. For relief, you can use Tylenol  or ibuprofen  as needed.  -HYPERLIPIDEMIA: Hyperlipidemia means having high levels of fats (like cholesterol) in your blood. You are currently managing this with Atorvastatin  40 mg daily, and we will continue this medication.  INSTRUCTIONS: Please follow up with us  in 3 months to review your blood sugar levels and overall progress. If your joint pain persists or worsens, contact us  sooner.                      Contains text generated by Abridge.                                 Contains text generated by Abridge.

## 2024-04-21 ENCOUNTER — Ambulatory Visit: Payer: Self-pay | Admitting: Internal Medicine

## 2024-04-21 ENCOUNTER — Other Ambulatory Visit: Payer: Self-pay | Admitting: Internal Medicine

## 2024-04-21 ENCOUNTER — Other Ambulatory Visit: Payer: Self-pay

## 2024-04-21 DIAGNOSIS — E1169 Type 2 diabetes mellitus with other specified complication: Secondary | ICD-10-CM

## 2024-04-21 LAB — COMPREHENSIVE METABOLIC PANEL WITH GFR
ALT: 30 IU/L (ref 0–32)
AST: 25 IU/L (ref 0–40)
Albumin: 4.7 g/dL (ref 3.8–4.9)
Alkaline Phosphatase: 108 IU/L (ref 49–135)
BUN/Creatinine Ratio: 19 (ref 9–23)
BUN: 11 mg/dL (ref 6–24)
Bilirubin Total: 0.3 mg/dL (ref 0.0–1.2)
CO2: 22 mmol/L (ref 20–29)
Calcium: 9.6 mg/dL (ref 8.7–10.2)
Chloride: 102 mmol/L (ref 96–106)
Creatinine, Ser: 0.59 mg/dL (ref 0.57–1.00)
Globulin, Total: 2.6 g/dL (ref 1.5–4.5)
Glucose: 101 mg/dL — ABNORMAL HIGH (ref 70–99)
Potassium: 4.2 mmol/L (ref 3.5–5.2)
Sodium: 140 mmol/L (ref 134–144)
Total Protein: 7.3 g/dL (ref 6.0–8.5)
eGFR: 109 mL/min/1.73 (ref >59)

## 2024-04-21 LAB — CBC
Hematocrit: 40.3 % (ref 34.0–46.6)
Hemoglobin: 13.3 g/dL (ref 11.1–15.9)
MCH: 30 pg (ref 26.6–33.0)
MCHC: 33 g/dL (ref 31.5–35.7)
MCV: 91 fL (ref 79–97)
Platelets: 274 x10E3/uL (ref 150–450)
RBC: 4.44 x10E6/uL (ref 3.77–5.28)
RDW: 12.5 % (ref 11.7–15.4)
WBC: 4.8 x10E3/uL (ref 3.4–10.8)

## 2024-04-21 LAB — LIPID PANEL
Chol/HDL Ratio: 3.6 ratio (ref 0.0–4.4)
Cholesterol, Total: 218 mg/dL — ABNORMAL HIGH (ref 100–199)
HDL: 61 mg/dL (ref >39)
LDL Chol Calc (NIH): 140 mg/dL — ABNORMAL HIGH (ref 0–99)
Triglycerides: 98 mg/dL (ref 0–149)
VLDL Cholesterol Cal: 17 mg/dL (ref 5–40)

## 2024-04-21 MED ORDER — ATORVASTATIN CALCIUM 40 MG PO TABS
60.0000 mg | ORAL_TABLET | Freq: Every day | ORAL | 1 refills | Status: AC
  Filled 2024-04-21: qty 135, 90d supply, fill #0

## 2024-04-21 NOTE — Progress Notes (Signed)
 Kidney and liver function tests are good. Cholesterol levels are more elevated compared to when last checked in December.  Current level is 140 with goal being less than 70.  Please confirm that she has been taking the atorvastatin  40 mg consistently daily over the past 1 to 2 months.  If she has been doing so, we will need to increase the dose to 1 1/2 tablets daily which would be 60 mg. Please let me know her response.  Healthy eating habits and regular exercise will also help in lowering cholesterol.

## 2024-05-11 ENCOUNTER — Other Ambulatory Visit: Payer: Self-pay

## 2024-05-11 ENCOUNTER — Other Ambulatory Visit: Payer: Self-pay | Admitting: Internal Medicine

## 2024-05-11 ENCOUNTER — Other Ambulatory Visit: Payer: Self-pay | Admitting: Pharmacist

## 2024-05-11 MED ORDER — TRUEPLUS 5-BEVEL PEN NEEDLES 32G X 4 MM MISC
2 refills | Status: AC
  Filled 2024-05-11: qty 100, 90d supply, fill #0

## 2024-05-20 IMAGING — DX DG CHEST 2V
2 series · 2 of 2 positions shown · non-contrast
Comparison: None Available.

CLINICAL DATA: Per ED Notes - Patient presents to ED via POV with
c/o dizziness and right sided eye pain. Reports tingling to right
cheek. Denies arm or leg numbness/tingling. Symptoms began yesterday
at 7977. No slurred speech noted. No facial droop.

EXAM:
CHEST - 2 VIEW

[chest pa]
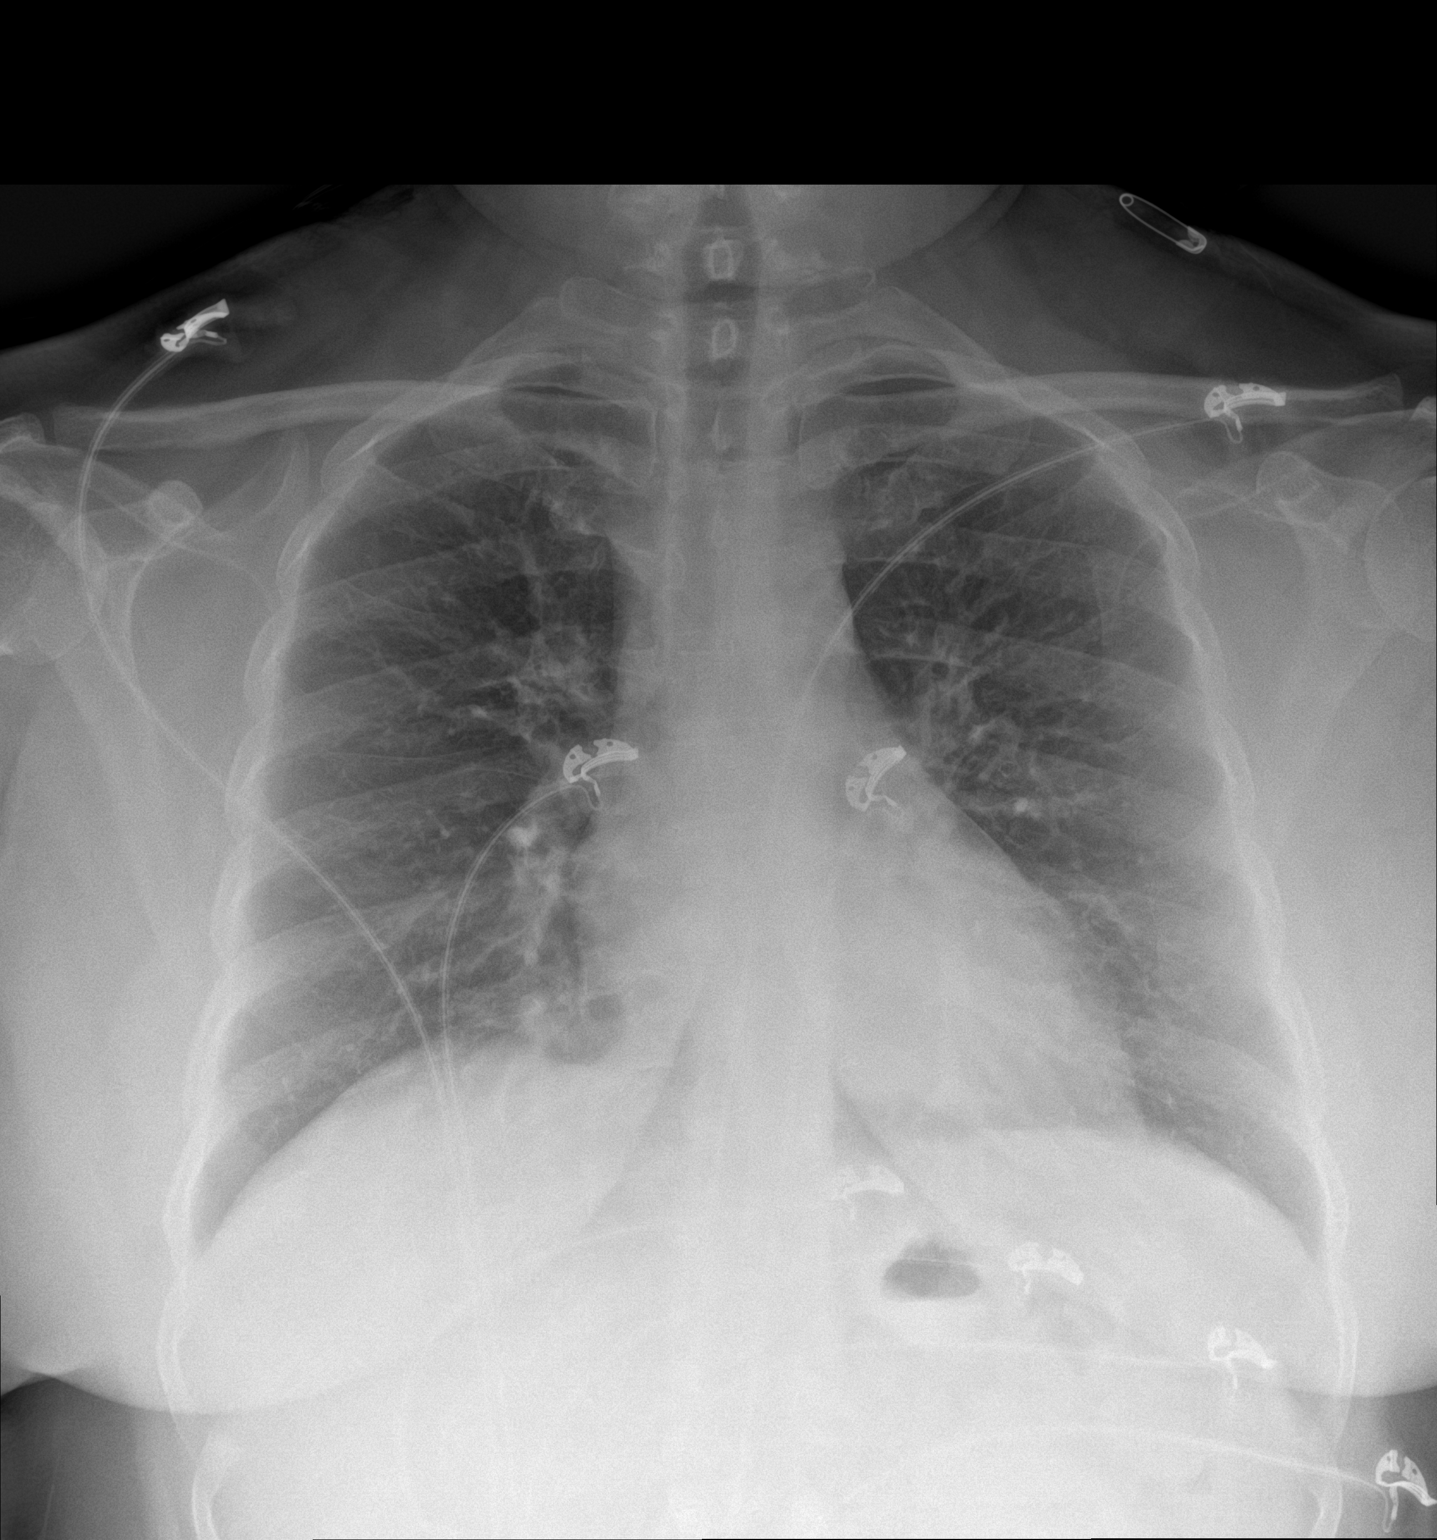

[chest lat]
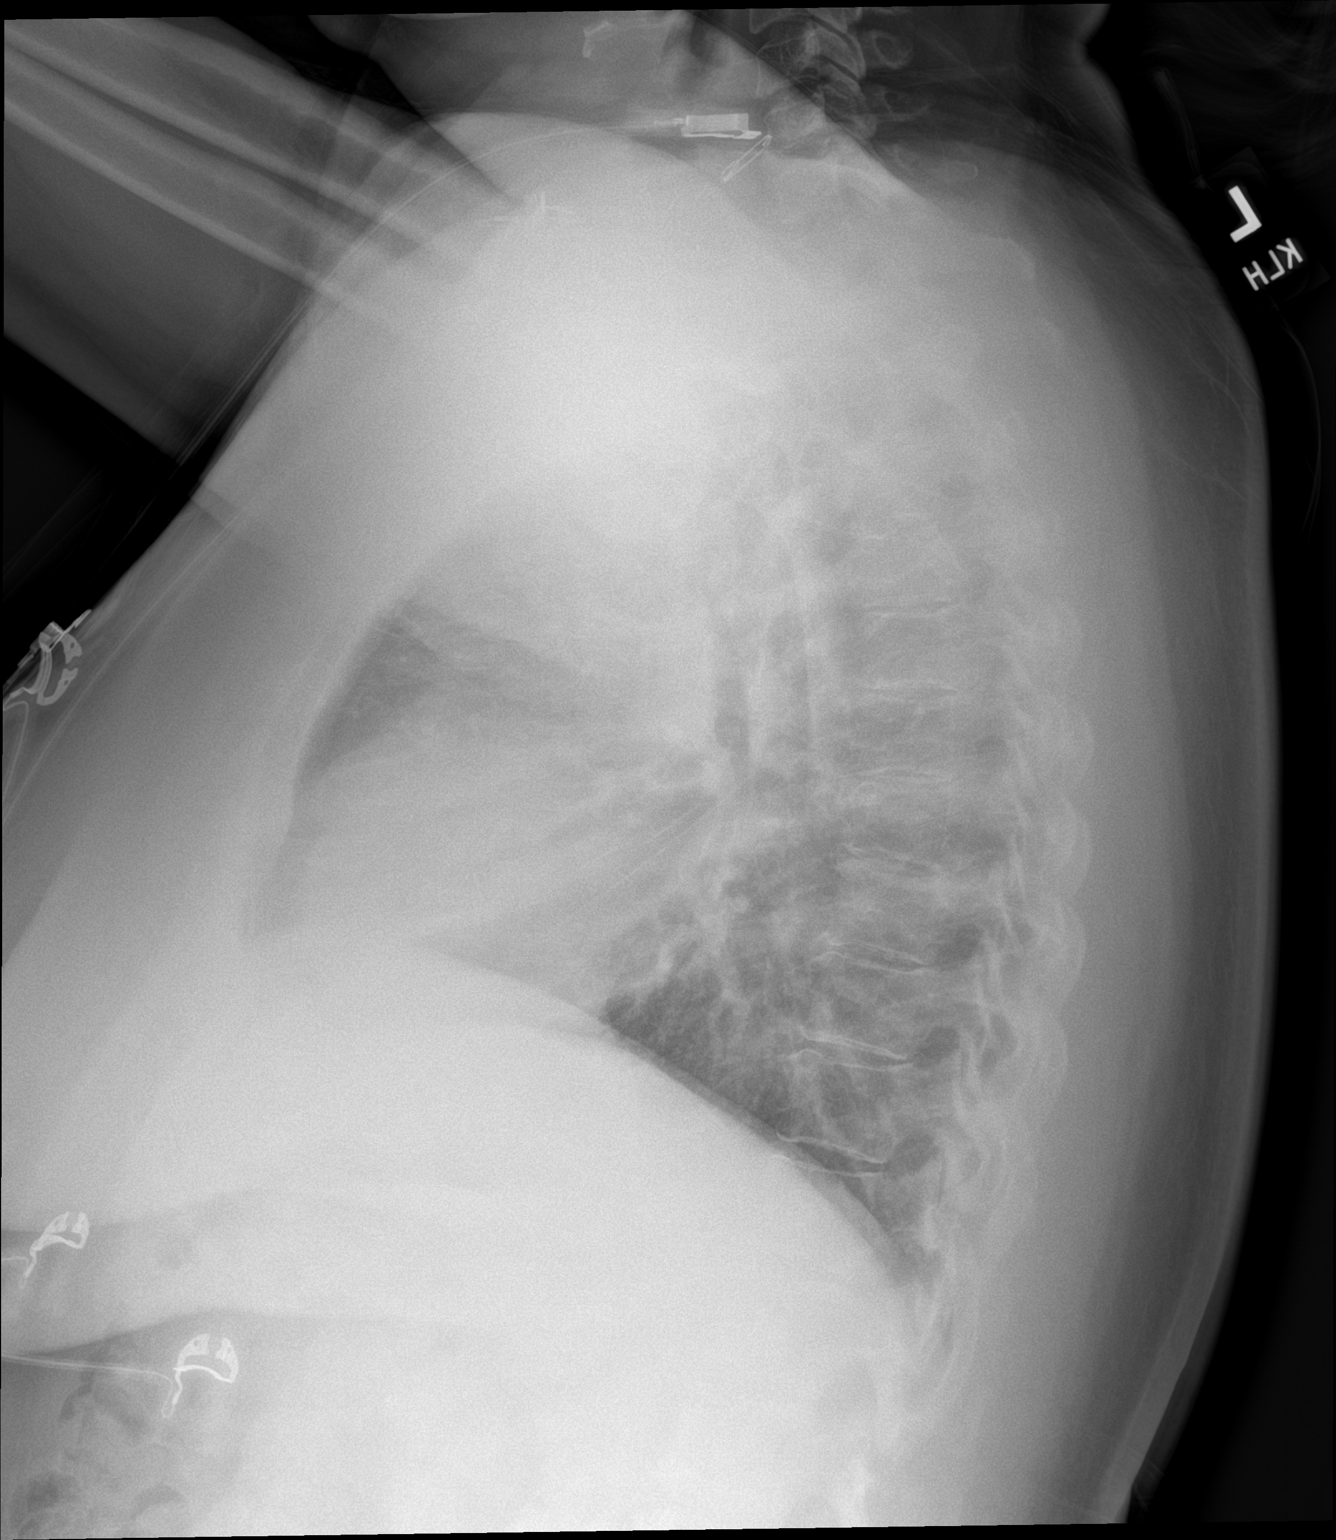

[2 of 2 positions shown; findings below may reference images not displayed]

FINDINGS: Lungs are clear.

Heart size and mediastinal contours are within normal limits.

No effusion.

Visualized bones unremarkable.
IMPRESSION: No acute cardiopulmonary disease.

## 2024-05-20 IMAGING — CT CT HEAD W/O CM
4 series · 16 of 47 positions shown, 18 images · non-contrast
Comparison: None Available.

CLINICAL DATA: Dizziness and right eye pain tingling to right cheek



[Series 2: head bone · axial · 0.41mm/px · z∈[-332,-300]mm · 3 of 80 slices shown]
[im 8/80  bone]
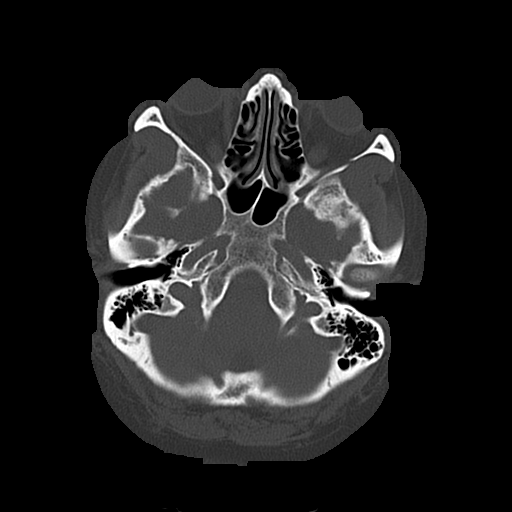
[im 16/80  bone]
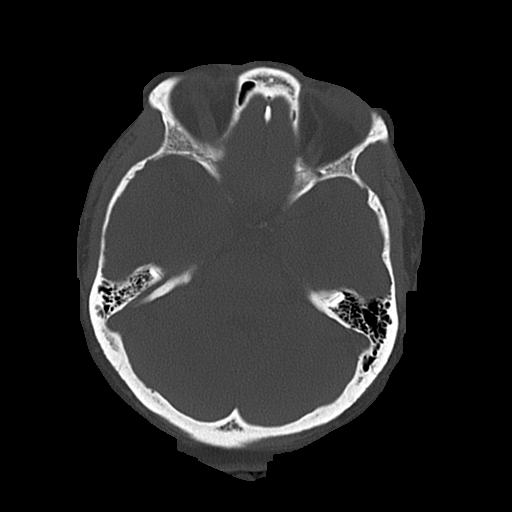
[im 24/80  bone]
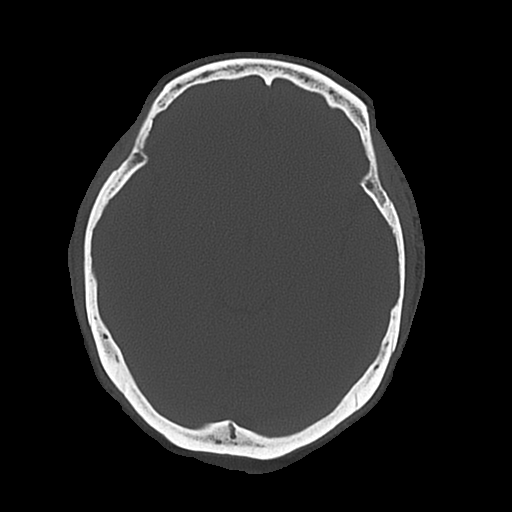

[Series 11: head wo · axial · 0.41mm/px · z∈[-331,-211]mm · 7 of 32 slices shown, 9 images]
[im 4/32  brain]
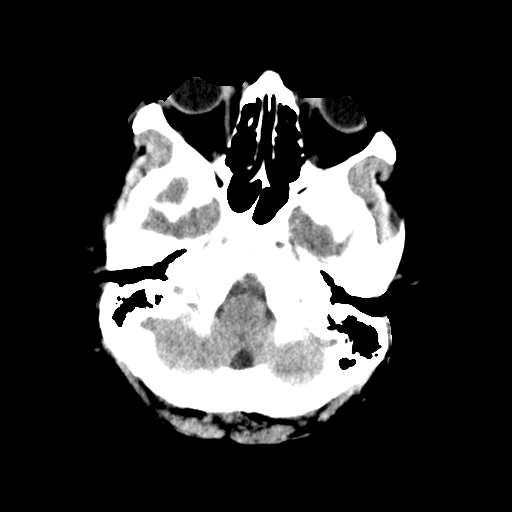
[im 4/32  bone]
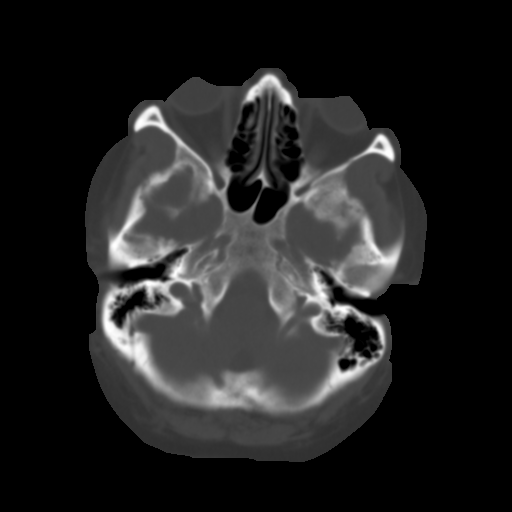
[im 8/32  brain]
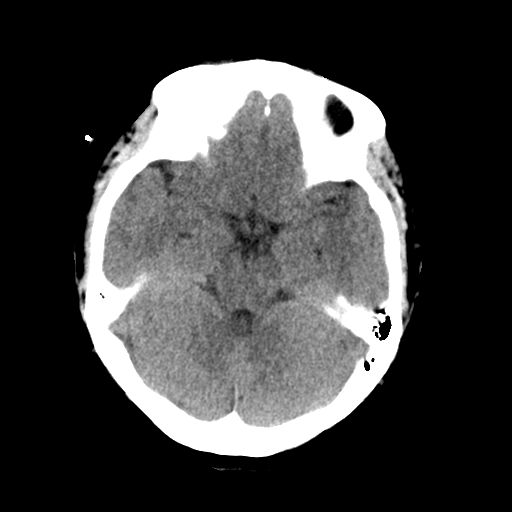
[im 12/32  brain]
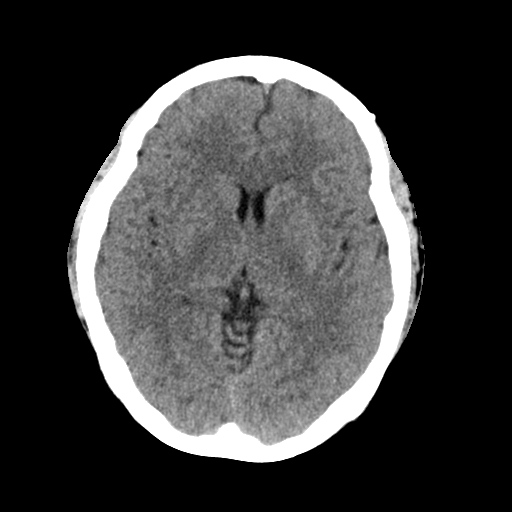
[im 16/32  brain]
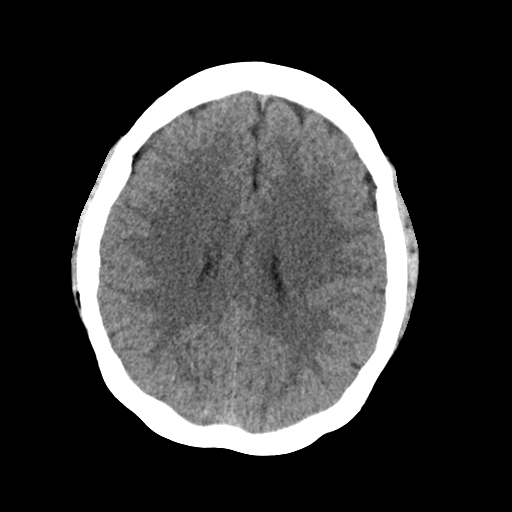
[im 20/32  brain]
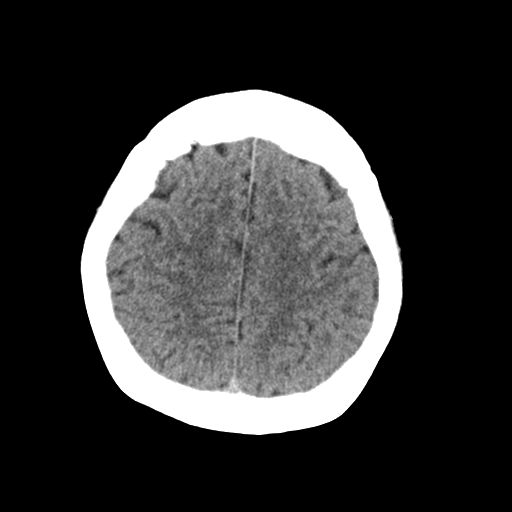
[im 20/32  bone]
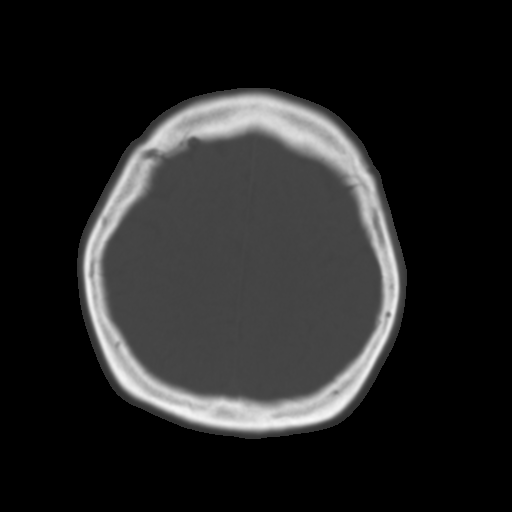
[im 24/32  brain]
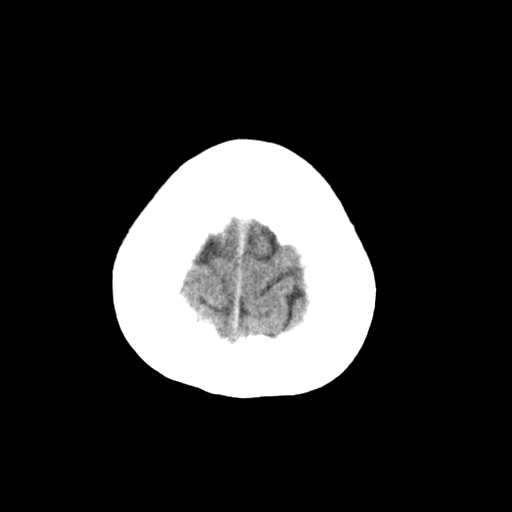
[im 28/32  brain]
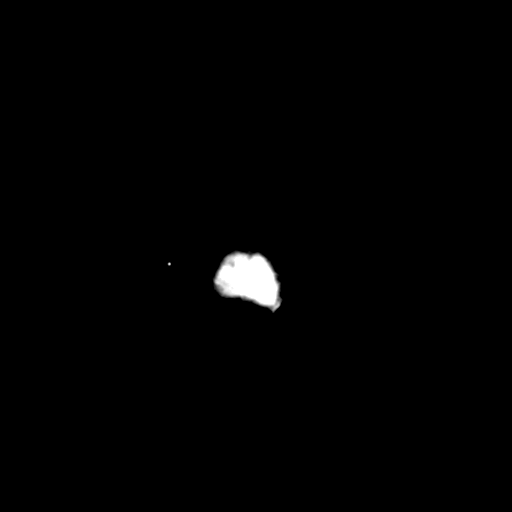

[Series 12: coronal soft · coronal · 0.30mm/px · 3 of 63 slices shown]
[im 21/63  brain]
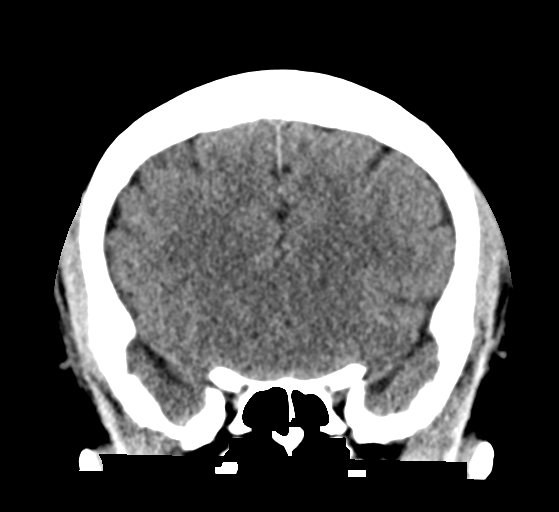
[im 28/63  brain]
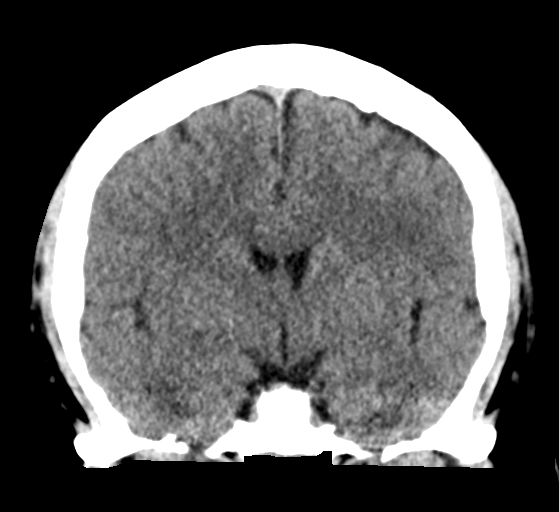
[im 35/63  brain]
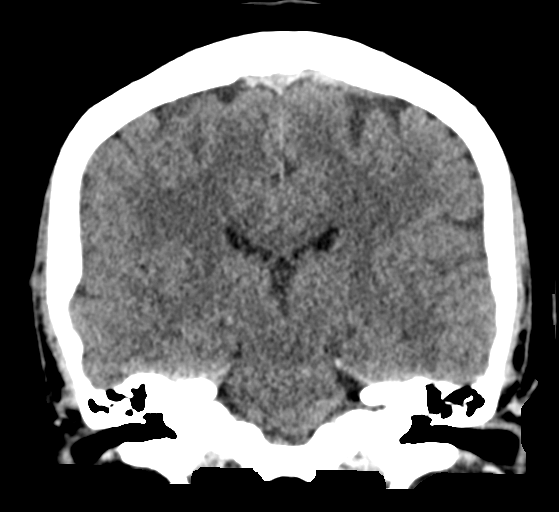

[Series 13: sagittal soft · sagittal · 0.29mm/px · 3 of 56 slices shown]
[im 19/56  brain]
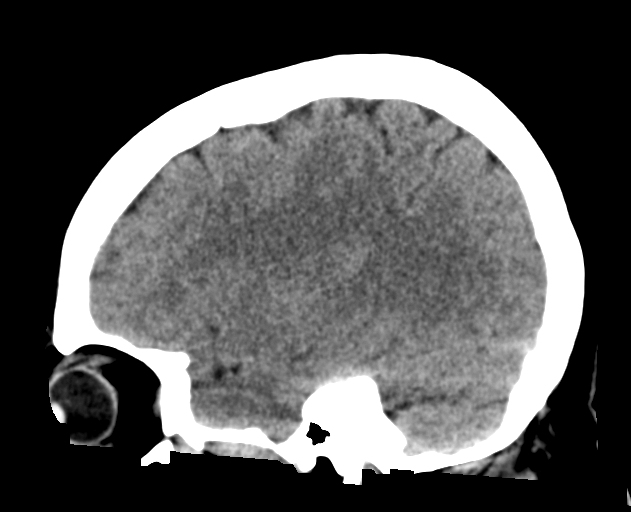
[im 28/56  brain]
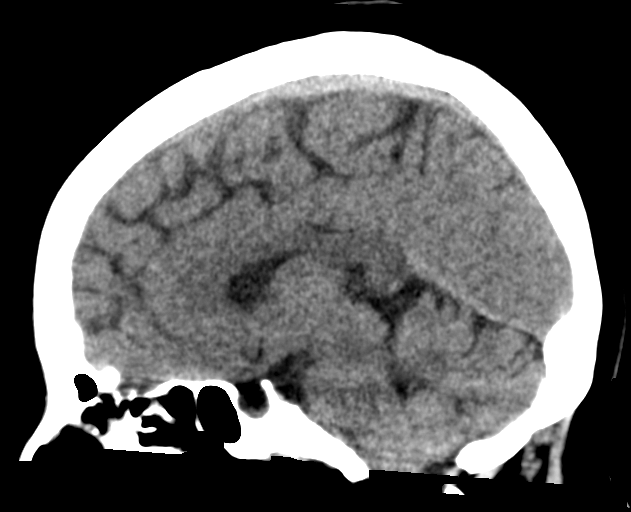
[im 37/56  brain]
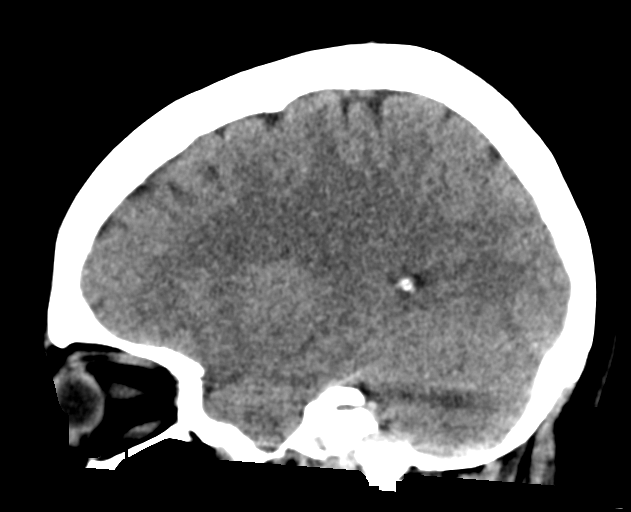

[16 of 47 positions shown; findings below may reference images not displayed]

FINDINGS: Brain: There is no acute intracranial hemorrhage, extra-axial fluid
collection, or acute infarct.

Parenchymal volume is normal. The ventricles are normal in size.
Gray-white differentiation is preserved.

There is no mass lesion.  There is no mass effect or midline shift.

Vascular: No hyperdense vessel or unexpected calcification.

Skull: Normal. Negative for fracture or focal lesion.

Sinuses/Orbits: The imaged paranasal sinuses are clear, evaluated in
full on the separately dictated CT facial bones. The globes and
orbits are unremarkable, also better evaluated on the separate CT
facial bones.

Other: None.
IMPRESSION: Normal noncontrast head CT. Please also refer to the separately
dictated CT facial bones.

## 2024-06-08 ENCOUNTER — Other Ambulatory Visit: Payer: Self-pay

## 2024-07-08 ENCOUNTER — Other Ambulatory Visit: Payer: Self-pay

## 2024-08-12 ENCOUNTER — Other Ambulatory Visit: Payer: Self-pay

## 2024-08-23 ENCOUNTER — Ambulatory Visit: Payer: Self-pay | Admitting: Internal Medicine

## 2024-09-21 ENCOUNTER — Ambulatory Visit: Payer: Self-pay | Admitting: Internal Medicine
